# Patient Record
Sex: Female | Born: 1945
Health system: Southern US, Community
[De-identification: ages and names within clinical notes are randomized; demographics above are authoritative.]

## PROBLEM LIST (undated history)

## (undated) DIAGNOSIS — N200 Calculus of kidney: Secondary | ICD-10-CM

## (undated) DIAGNOSIS — I10 Essential (primary) hypertension: Secondary | ICD-10-CM

## (undated) DIAGNOSIS — I4891 Unspecified atrial fibrillation: Secondary | ICD-10-CM

## (undated) DIAGNOSIS — E119 Type 2 diabetes mellitus without complications: Secondary | ICD-10-CM

## (undated) DIAGNOSIS — C50919 Malignant neoplasm of unspecified site of unspecified female breast: Secondary | ICD-10-CM

## (undated) HISTORY — DX: Type 2 diabetes mellitus without complications: E11.9

## (undated) HISTORY — DX: Essential (primary) hypertension: I10

## (undated) HISTORY — PX: TUBAL LIGATION: SHX77

## (undated) HISTORY — PX: ADENOIDECTOMY: SUR15

## (undated) HISTORY — PX: TONSILLECTOMY: SUR1361

## (undated) HISTORY — DX: Unspecified atrial fibrillation: I48.91

## (undated) HISTORY — DX: Calculus of kidney: N20.0

## (undated) HISTORY — DX: Malignant neoplasm of unspecified site of unspecified female breast: C50.919

---

## 2011-12-12 ENCOUNTER — Encounter: Payer: Self-pay | Admitting: Physician Assistant

## 2011-12-12 DIAGNOSIS — I4891 Unspecified atrial fibrillation: Secondary | ICD-10-CM

## 2011-12-26 ENCOUNTER — Encounter: Payer: Self-pay | Admitting: *Deleted

## 2011-12-29 ENCOUNTER — Encounter: Payer: Self-pay | Admitting: Physician Assistant

## 2011-12-31 ENCOUNTER — Encounter: Payer: Self-pay | Admitting: Cardiology

## 2012-01-01 ENCOUNTER — Encounter: Admitting: Physician Assistant

## 2012-01-12 ENCOUNTER — Other Ambulatory Visit: Payer: Self-pay | Admitting: *Deleted

## 2012-01-12 ENCOUNTER — Encounter: Payer: Self-pay | Admitting: Cardiology

## 2012-01-12 ENCOUNTER — Ambulatory Visit (INDEPENDENT_AMBULATORY_CARE_PROVIDER_SITE_OTHER): Admitting: Cardiology

## 2012-01-12 VITALS — BP 179/91 | HR 71 | Ht 62.0 in | Wt 151.0 lb

## 2012-01-12 DIAGNOSIS — I10 Essential (primary) hypertension: Secondary | ICD-10-CM

## 2012-01-12 DIAGNOSIS — R9431 Abnormal electrocardiogram [ECG] [EKG]: Secondary | ICD-10-CM

## 2012-01-12 DIAGNOSIS — I4891 Unspecified atrial fibrillation: Secondary | ICD-10-CM | POA: Insufficient documentation

## 2012-01-12 MED ORDER — RIVAROXABAN 20 MG PO TABS: 20.0000 mg | ORAL_TABLET | Freq: Every day | ORAL | Status: DC

## 2012-01-12 MED ORDER — LISINOPRIL-HYDROCHLOROTHIAZIDE 20-12.5 MG PO TABS: 2.0000 | ORAL_TABLET | Freq: Every day | ORAL | Status: DC

## 2012-01-12 NOTE — Assessment & Plan Note (Signed)
She did not have evidence of an anterior MI on her echo. However, she has significant cardiovascular risk factors. My plan is an exercise treadmill test for her blood pressure is better controlled.

## 2012-01-12 NOTE — Patient Instructions (Addendum)
Your physician recommends that you schedule a follow-up appointment in: 3 months with Dr. Percival Spanish.   Your physician has recommended you make the following change in your medication:  Start xarelto 20 mg daily with supper or your biggest meal. Don't start Xarelto until after you've gotten approval from Dr. Loetta Rough) Increase lisinopril/hct take 2 by mouth daily to equal 40/25 mg. You have been given a 90 day supply prescription for both medications and a month supply has been sent to your local pharmacy.  Your physician recommends that you return for lab work in: in one week around July 8th 2013 at [facility].

## 2012-01-12 NOTE — Assessment & Plan Note (Signed)
The patient apparently has paroxysmal atrial fibrillation and she is in sinus now. She does have his CHADS score of 2 and a CHADS VASC score of 3 which gives her a 3.2% per year risk of stroke. She has a large left atrium on echo. I have discussed this with her in the risk benefits of anticoagulation. I would suggest treatment with anticoagulation once this he is cleared by her urologist. She will start Xarelto.

## 2012-01-12 NOTE — Progress Notes (Signed)
HPI The patient is a pleasant 66 year old female. She has no prior cardiac history. Recently she was in the hospital for management of ureteral stones. She had a urinary tract infection. She had some bacteremia. She was noted to have atrial fibrillation. She did not notice this rhythm. She doesn't describe any palpitations, presyncope or syncope.  She was seen in consultation by Dr. Domenic Polite.  She was not started on anticoagulation apparently because of the need for lithotripsy. She did have an echocardiogram which demonstrated moderate concentric left ventricular hypertrophy with an EF of 50-55%. She had significant left atrial enlargement. She was apparently in fibrillation at discharge but currently is sinus as described below. He denies any chest pressure, neck or arm discomfort. He denies any shortness of breath, PND or orthopnea. She's active.  Allergies  Allergen Reactions  . Sulfa Antibiotics     Current Outpatient Prescriptions  Medication Sig Dispense Refill  . atorvastatin (LIPITOR) 40 MG tablet Take 40 mg by mouth daily.      Marland Kitchen diltiazem (CARDIZEM CD) 120 MG 24 hr capsule Take 120 mg by mouth daily.      Marland Kitchen exenatide (BYETTA) 10 MCG/0.04ML SOLN Inject 10 mcg into the skin 2 (two) times daily with a meal.      . fluticasone (FLONASE) 50 MCG/ACT nasal spray Place 2 sprays into the nose daily.      Marland Kitchen lisinopril-hydrochlorothiazide (PRINZIDE,ZESTORETIC) 20-12.5 MG per tablet Take 1 tablet by mouth daily.      Marland Kitchen loratadine (CLARITIN) 10 MG tablet Take 10 mg by mouth daily.      . metFORMIN (GLUCOPHAGE) 1000 MG tablet Take 1,000 mg by mouth 2 (two) times daily with a meal.      . metoprolol succinate (TOPROL-XL) 50 MG 24 hr tablet Take 25 mg by mouth daily. Take with or immediately following a meal.      . Multiple Vitamin (MULTIVITAMIN) tablet Take 1 tablet by mouth daily.        Past Medical History  Diagnosis Date  . Atrial fibrillation   . Kidney stone     resulting in stenting    . Type 2 diabetes mellitus   . Unspecified essential hypertension     Past Surgical History  Procedure Date  . Tubal ligation   . Tonsillectomy   . Adenoidectomy     ROS: As stated in the HPI and negative for all other systems.  PHYSICAL EXAM BP 179/91  Pulse 71  Ht 5\' 2"  (1.575 m)  Wt 151 lb (68.493 kg)  BMI 27.62 kg/m2 GENERAL:  Well appearing HEENT:  Pupils equal round and reactive, fundi not visualized, oral mucosa unremarkable NECK:  No jugular venous distention, waveform within normal limits, carotid upstroke brisk and symmetric, no bruits, no thyromegaly LYMPHATICS:  No cervical, inguinal adenopathy LUNGS:  Clear to auscultation bilaterally BACK:  No CVA tenderness CHEST:  Unremarkable HEART:  PMI not displaced or sustained,S1 and S2 within normal limits, no S3, no S4, no clicks, no rubs, no murmurs ABD:  Flat, positive bowel sounds normal in frequency in pitch, no bruits, no rebound, no guarding, no midline pulsatile mass, no hepatomegaly, no splenomegaly EXT:  2 plus pulses throughout, no edema, no cyanosis no clubbing SKIN:  No rashes no nodules NEURO:  Cranial nerves II through XII grossly intact, motor grossly intact throughout PSYCH:  Cognitively intact, oriented to person place and time   EKG:  Sinus rhythm, rate 74, premature atrial contractions, premature ventricular contractions, left axis  deviation, poor anterior R wave progression, possible old anteroseptal infarct, no acute ST-T wave changes. 01/12/2012  ASSESSMENT AND PLAN

## 2012-01-12 NOTE — Assessment & Plan Note (Signed)
Her blood pressure is not well controlled. I will increase her lisinopril HCT to 40/25. A basic metabolic profile in one week.

## 2012-01-27 ENCOUNTER — Telehealth: Payer: Self-pay | Admitting: *Deleted

## 2012-01-27 NOTE — Telephone Encounter (Signed)
Patient informed via message machine. 

## 2012-01-27 NOTE — Telephone Encounter (Signed)
Message copied by Merlene Laughter on Tue Jan 27, 2012 10:46 AM ------      Message from: Minus Breeding      Created: Mon Jan 26, 2012 11:44 AM       Labs OK.

## 2012-02-05 ENCOUNTER — Other Ambulatory Visit (HOSPITAL_COMMUNITY): Payer: Self-pay | Admitting: Urology

## 2012-02-05 ENCOUNTER — Ambulatory Visit (HOSPITAL_COMMUNITY)
Admission: RE | Admit: 2012-02-05 | Discharge: 2012-02-05 | Disposition: A | Discharge status: Home / Self Care | Source: Ambulatory Visit | Attending: Urology | Admitting: Urology

## 2012-02-05 DIAGNOSIS — N201 Calculus of ureter: Secondary | ICD-10-CM

## 2012-04-05 ENCOUNTER — Encounter: Payer: Self-pay | Admitting: Cardiology

## 2012-04-05 ENCOUNTER — Encounter: Payer: Self-pay | Admitting: *Deleted

## 2012-04-05 ENCOUNTER — Other Ambulatory Visit: Payer: Self-pay | Admitting: *Deleted

## 2012-04-05 ENCOUNTER — Ambulatory Visit (INDEPENDENT_AMBULATORY_CARE_PROVIDER_SITE_OTHER): Admitting: Cardiology

## 2012-04-05 VITALS — BP 219/110 | HR 68 | Ht 62.75 in | Wt 164.0 lb

## 2012-04-05 DIAGNOSIS — I4891 Unspecified atrial fibrillation: Secondary | ICD-10-CM

## 2012-04-05 DIAGNOSIS — I10 Essential (primary) hypertension: Secondary | ICD-10-CM

## 2012-04-05 MED ORDER — SPIRONOLACTONE 25 MG PO TABS: 25.0000 mg | ORAL_TABLET | Freq: Every day | ORAL | Status: DC

## 2012-04-05 NOTE — Patient Instructions (Addendum)
Your physician recommends that you schedule a follow-up appointment in: 1 month. Your physician has recommended you make the following change in your medication: Start spironolactone 25 mg daily. All other medications will remain the same. Your new prescription has been sent to your pharmacy. Your physician recommends that you return for lab work in one week on September 30th, 2013 at [facility]. Your physician has requested that you regularly monitor and record your blood pressure readings at home. Please use the same machine at the same time of day to check your readings and record them to bring to your follow-up visit. Please come back to our office this afternoon to have your BP monitor checked.

## 2012-04-05 NOTE — Progress Notes (Signed)
HPI The patient presents for followup of atrial fibrillation and hypertension. Since last saw her she did start [place].   She has had no problems with this. She has had no symptomatic tachypalpitations. She denies any chest pressure, neck or arm discomfort. She denies any shortness of breath, PND or orthopnea. Her blood pressure is up today because she just drove from [place] and she did not take any of her medications as they might make her tired. She says her blood pressures have been in the 130s to [id number] at home systolic.  However, she's not sure if her blood pressure cuff is accurate.  Allergies  Allergen Reactions  . Sulfa Antibiotics     Current Outpatient Prescriptions  Medication Sig Dispense Refill  . atorvastatin (LIPITOR) 40 MG tablet Take 40 mg by mouth daily.      Marland Kitchen diltiazem (CARDIZEM CD) 120 MG 24 hr capsule Take 120 mg by mouth daily.      Marland Kitchen exenatide (BYETTA) 10 MCG/0.04ML SOLN Inject 10 mcg into the skin 2 (two) times daily with a meal.      . fluticasone (FLONASE) 50 MCG/ACT nasal spray Place 2 sprays into the nose daily.      Marland Kitchen lisinopril-hydrochlorothiazide (PRINZIDE,ZESTORETIC) 20-12.5 MG per tablet Take 2 tablets by mouth daily.  180 tablet  3  . loratadine (CLARITIN) 10 MG tablet Take 10 mg by mouth daily.      . metFORMIN (GLUCOPHAGE) 1000 MG tablet Take 1,000 mg by mouth 2 (two) times daily with a meal.      . metoprolol succinate (TOPROL-XL) 50 MG 24 hr tablet Take 25 mg by mouth daily. Take with or immediately following a meal.      . Multiple Vitamin (MULTIVITAMIN) tablet Take 1 tablet by mouth daily.      . Rivaroxaban (XARELTO) 20 MG TABS Take 1 tablet (20 mg total) by mouth daily with supper.  90 tablet  3    Past Medical History  Diagnosis Date  . Atrial fibrillation   . Kidney stone     resulting in stenting  . Type 2 diabetes mellitus   . Unspecified essential hypertension     Past Surgical History  Procedure Date  . Tubal ligation   .  Tonsillectomy   . Adenoidectomy     ROS: As stated in the HPI and negative for all other systems.  PHYSICAL EXAM BP 219/110  Pulse 68  Ht 5' 2.75" (1.594 m)  Wt 164 lb (74.39 kg)  BMI 29.28 kg/m2 GENERAL:  Well appearing HEENT:  Pupils equal round and reactive, fundi not visualized, oral mucosa unremarkable NECK:  No jugular venous distention, waveform within normal limits, carotid upstroke brisk and symmetric, no bruits, no thyromegaly LYMPHATICS:  No cervical, inguinal adenopathy LUNGS:  Clear to auscultation bilaterally BACK:  No CVA tenderness CHEST:  Unremarkable HEART:  PMI not displaced or sustained,S1 and S2 within normal limits, no S3, no S4, no clicks, no rubs, no murmurs ABD:  Flat, positive bowel sounds normal in frequency in pitch, no bruits, no rebound, no guarding, no midline pulsatile mass, no hepatomegaly, no splenomegaly EXT:  2 plus pulses throughout, no edema, no cyanosis no clubbing SKIN:  No rashes no nodules NEURO:  Cranial nerves II through XII grossly intact, motor grossly intact throughout PSYCH:  Cognitively intact, oriented to person place and time  EKG:   Sinus rhythm, rate 68, leftward axis, intervals within normal limits, poor anterior R wave progression suggestive of  an old anteroseptal infarct, no acute ST-T wave changes , no change from previous.  04/05/2012  ASSESSMENT AND PLAN  Atrial fibrillation -  The patient  tolerates this rhythm and rate control and anticoagulation. We will continue with the meds as listed.  HTN (hypertension) - She tolerated her increased dose of lisinopril HCT in the last appointment. However, I still don't think she's well controlled. I'm going to add spironolactone. She should come back later today to have her blood pressure cuff the elevated and a repeat blood pressure after she has rested and taken her medications.  Abnormal EKG -  She did not have evidence of an anterior MI on her echo previously. However, she has  significant cardiovascular risk factors. My plan is an exercise treadmill test for her blood pressure is better controlled.

## 2012-04-12 ENCOUNTER — Encounter: Payer: Self-pay | Admitting: Cardiology

## 2012-04-20 ENCOUNTER — Telehealth: Payer: Self-pay | Admitting: *Deleted

## 2012-04-20 NOTE — Telephone Encounter (Signed)
Patient informed and copy sent to PCP. 

## 2012-04-20 NOTE — Telephone Encounter (Signed)
Message copied by Merlene Laughter on Tue Apr 20, 2012  2:12 PM ------      Message from: Minus Breeding      Created: Sun Apr 18, 2012 12:53 PM       Labs OK.  Call Ms. Pollard with the results and send results to Manon Hilding, MD

## 2012-04-28 ENCOUNTER — Ambulatory Visit: Admitting: Cardiology

## 2012-10-27 DIAGNOSIS — E119 Type 2 diabetes mellitus without complications: Secondary | ICD-10-CM | POA: Diagnosis not present

## 2012-10-27 DIAGNOSIS — E7800 Pure hypercholesterolemia, unspecified: Secondary | ICD-10-CM | POA: Diagnosis not present

## 2012-10-27 DIAGNOSIS — I10 Essential (primary) hypertension: Secondary | ICD-10-CM | POA: Diagnosis not present

## 2012-11-03 DIAGNOSIS — I10 Essential (primary) hypertension: Secondary | ICD-10-CM | POA: Diagnosis not present

## 2012-11-03 DIAGNOSIS — N189 Chronic kidney disease, unspecified: Secondary | ICD-10-CM | POA: Diagnosis not present

## 2012-11-03 DIAGNOSIS — J309 Allergic rhinitis, unspecified: Secondary | ICD-10-CM | POA: Diagnosis not present

## 2012-11-03 DIAGNOSIS — E119 Type 2 diabetes mellitus without complications: Secondary | ICD-10-CM | POA: Diagnosis not present

## 2012-11-03 DIAGNOSIS — E7800 Pure hypercholesterolemia, unspecified: Secondary | ICD-10-CM | POA: Diagnosis not present

## 2012-11-07 DIAGNOSIS — N1330 Unspecified hydronephrosis: Secondary | ICD-10-CM | POA: Diagnosis not present

## 2012-11-07 DIAGNOSIS — D279 Benign neoplasm of unspecified ovary: Secondary | ICD-10-CM | POA: Diagnosis not present

## 2012-11-07 DIAGNOSIS — Z87442 Personal history of urinary calculi: Secondary | ICD-10-CM | POA: Diagnosis not present

## 2012-11-07 DIAGNOSIS — N200 Calculus of kidney: Secondary | ICD-10-CM | POA: Diagnosis not present

## 2012-11-07 DIAGNOSIS — I4891 Unspecified atrial fibrillation: Secondary | ICD-10-CM | POA: Diagnosis not present

## 2012-11-07 DIAGNOSIS — K7689 Other specified diseases of liver: Secondary | ICD-10-CM | POA: Diagnosis not present

## 2012-11-07 DIAGNOSIS — E119 Type 2 diabetes mellitus without complications: Secondary | ICD-10-CM | POA: Diagnosis not present

## 2012-11-07 DIAGNOSIS — I10 Essential (primary) hypertension: Secondary | ICD-10-CM | POA: Diagnosis not present

## 2012-11-07 DIAGNOSIS — N390 Urinary tract infection, site not specified: Secondary | ICD-10-CM | POA: Diagnosis not present

## 2012-11-07 DIAGNOSIS — Z79899 Other long term (current) drug therapy: Secondary | ICD-10-CM | POA: Diagnosis not present

## 2012-11-10 DIAGNOSIS — N950 Postmenopausal bleeding: Secondary | ICD-10-CM | POA: Diagnosis not present

## 2012-11-10 DIAGNOSIS — Z124 Encounter for screening for malignant neoplasm of cervix: Secondary | ICD-10-CM | POA: Diagnosis not present

## 2012-11-10 DIAGNOSIS — Z01419 Encounter for gynecological examination (general) (routine) without abnormal findings: Secondary | ICD-10-CM | POA: Diagnosis not present

## 2012-11-12 DIAGNOSIS — Z1231 Encounter for screening mammogram for malignant neoplasm of breast: Secondary | ICD-10-CM | POA: Diagnosis not present

## 2012-11-19 DIAGNOSIS — D259 Leiomyoma of uterus, unspecified: Secondary | ICD-10-CM | POA: Diagnosis not present

## 2012-11-19 DIAGNOSIS — N83209 Unspecified ovarian cyst, unspecified side: Secondary | ICD-10-CM | POA: Diagnosis not present

## 2012-11-19 DIAGNOSIS — N950 Postmenopausal bleeding: Secondary | ICD-10-CM | POA: Diagnosis not present

## 2012-12-08 DIAGNOSIS — R21 Rash and other nonspecific skin eruption: Secondary | ICD-10-CM | POA: Diagnosis not present

## 2012-12-21 DIAGNOSIS — N950 Postmenopausal bleeding: Secondary | ICD-10-CM | POA: Diagnosis not present

## 2012-12-21 DIAGNOSIS — I10 Essential (primary) hypertension: Secondary | ICD-10-CM | POA: Diagnosis not present

## 2012-12-21 DIAGNOSIS — N859 Noninflammatory disorder of uterus, unspecified: Secondary | ICD-10-CM | POA: Diagnosis not present

## 2013-01-10 DIAGNOSIS — Z780 Asymptomatic menopausal state: Secondary | ICD-10-CM | POA: Diagnosis not present

## 2013-01-10 DIAGNOSIS — M949 Disorder of cartilage, unspecified: Secondary | ICD-10-CM | POA: Diagnosis not present

## 2013-01-10 DIAGNOSIS — M899 Disorder of bone, unspecified: Secondary | ICD-10-CM | POA: Diagnosis not present

## 2013-01-28 DIAGNOSIS — E119 Type 2 diabetes mellitus without complications: Secondary | ICD-10-CM | POA: Diagnosis not present

## 2013-01-28 DIAGNOSIS — E7800 Pure hypercholesterolemia, unspecified: Secondary | ICD-10-CM | POA: Diagnosis not present

## 2013-01-28 DIAGNOSIS — I10 Essential (primary) hypertension: Secondary | ICD-10-CM | POA: Diagnosis not present

## 2013-02-08 DIAGNOSIS — E119 Type 2 diabetes mellitus without complications: Secondary | ICD-10-CM | POA: Diagnosis not present

## 2013-02-08 DIAGNOSIS — N189 Chronic kidney disease, unspecified: Secondary | ICD-10-CM | POA: Diagnosis not present

## 2013-02-08 DIAGNOSIS — I10 Essential (primary) hypertension: Secondary | ICD-10-CM | POA: Diagnosis not present

## 2013-02-08 DIAGNOSIS — E7800 Pure hypercholesterolemia, unspecified: Secondary | ICD-10-CM | POA: Diagnosis not present

## 2013-02-08 DIAGNOSIS — J309 Allergic rhinitis, unspecified: Secondary | ICD-10-CM | POA: Diagnosis not present

## 2013-05-09 DIAGNOSIS — E119 Type 2 diabetes mellitus without complications: Secondary | ICD-10-CM | POA: Diagnosis not present

## 2013-05-10 DIAGNOSIS — E7800 Pure hypercholesterolemia, unspecified: Secondary | ICD-10-CM | POA: Diagnosis not present

## 2013-05-10 DIAGNOSIS — E119 Type 2 diabetes mellitus without complications: Secondary | ICD-10-CM | POA: Diagnosis not present

## 2013-05-10 DIAGNOSIS — N189 Chronic kidney disease, unspecified: Secondary | ICD-10-CM | POA: Diagnosis not present

## 2013-05-10 DIAGNOSIS — J309 Allergic rhinitis, unspecified: Secondary | ICD-10-CM | POA: Diagnosis not present

## 2013-05-10 DIAGNOSIS — I10 Essential (primary) hypertension: Secondary | ICD-10-CM | POA: Diagnosis not present

## 2013-08-17 DIAGNOSIS — E7800 Pure hypercholesterolemia, unspecified: Secondary | ICD-10-CM | POA: Diagnosis not present

## 2013-08-17 DIAGNOSIS — I10 Essential (primary) hypertension: Secondary | ICD-10-CM | POA: Diagnosis not present

## 2013-08-17 DIAGNOSIS — E119 Type 2 diabetes mellitus without complications: Secondary | ICD-10-CM | POA: Diagnosis not present

## 2013-08-17 DIAGNOSIS — N189 Chronic kidney disease, unspecified: Secondary | ICD-10-CM | POA: Diagnosis not present

## 2013-08-19 DIAGNOSIS — E119 Type 2 diabetes mellitus without complications: Secondary | ICD-10-CM | POA: Diagnosis not present

## 2013-08-19 DIAGNOSIS — N189 Chronic kidney disease, unspecified: Secondary | ICD-10-CM | POA: Diagnosis not present

## 2013-08-19 DIAGNOSIS — J309 Allergic rhinitis, unspecified: Secondary | ICD-10-CM | POA: Diagnosis not present

## 2013-08-19 DIAGNOSIS — I10 Essential (primary) hypertension: Secondary | ICD-10-CM | POA: Diagnosis not present

## 2013-08-19 DIAGNOSIS — I499 Cardiac arrhythmia, unspecified: Secondary | ICD-10-CM | POA: Diagnosis not present

## 2013-08-19 DIAGNOSIS — E7800 Pure hypercholesterolemia, unspecified: Secondary | ICD-10-CM | POA: Diagnosis not present

## 2013-08-24 ENCOUNTER — Ambulatory Visit (INDEPENDENT_AMBULATORY_CARE_PROVIDER_SITE_OTHER): Payer: Medicare Other | Admitting: Cardiovascular Disease

## 2013-08-24 VITALS — BP 218/119

## 2013-08-24 DIAGNOSIS — R9431 Abnormal electrocardiogram [ECG] [EKG]: Secondary | ICD-10-CM

## 2013-08-24 DIAGNOSIS — I10 Essential (primary) hypertension: Secondary | ICD-10-CM | POA: Diagnosis not present

## 2013-08-24 DIAGNOSIS — I4891 Unspecified atrial fibrillation: Secondary | ICD-10-CM | POA: Diagnosis not present

## 2013-08-24 DIAGNOSIS — E785 Hyperlipidemia, unspecified: Secondary | ICD-10-CM

## 2013-08-24 MED ORDER — APIXABAN 5 MG PO TABS: 5.0000 mg | ORAL_TABLET | Freq: Two times a day (BID) | ORAL | Status: DC

## 2013-08-24 NOTE — Progress Notes (Signed)
Patient ID: Betty Goodman, female   DOB: [birth year], 68 y.o.   MRN: [id number]      SUBJECTIVE: The patient is a 68 year old woman with a history of atrial fibrillation, hypertension, diabetes mellitus, and hyperlipidemia. An ECG performed on 08/19/2013 showed normal sinus rhythm with atrial bigeminy. She has not been taking her Xarelto as it led to vaginal bleeding, and she has a h/o anemia. She also did not take her antihypertensive medications this morning as she was in a rush as her husband needs back surgery in [place]. Review of recent labs from February 4 showed HbA1c 7.5%, total cholesterol 133, triglycerides 110, HDL 50, LDL 61, BUN 18, creatinine 0.87. An ECG performed in the office today shows sinus rhythm with PACs and PVCs as well as a left anterior fascicular block with a QRS axis of -50. The patient denies any symptoms of chest pain, palpitations, shortness of breath, lightheadedness, dizziness, leg swelling, orthopnea, PND, and syncope.     Allergies  Allergen Reactions  . Sulfa Antibiotics     Current Outpatient Prescriptions  Medication Sig Dispense Refill  . atorvastatin (LIPITOR) 40 MG tablet Take 40 mg by mouth daily.      Marland Kitchen diltiazem (CARDIZEM CD) 120 MG 24 hr capsule Take 120 mg by mouth daily.      Marland Kitchen exenatide (BYETTA) 10 MCG/0.04ML SOLN Inject 10 mcg into the skin 2 (two) times daily with a meal.      . fluticasone (FLONASE) 50 MCG/ACT nasal spray Place 2 sprays into the nose daily.      Marland Kitchen lisinopril-hydrochlorothiazide (PRINZIDE,ZESTORETIC) 20-12.5 MG per tablet Take 2 tablets by mouth daily.  180 tablet  3  . loratadine (CLARITIN) 10 MG tablet Take 10 mg by mouth daily.      . metFORMIN (GLUCOPHAGE) 1000 MG tablet Take 1,000 mg by mouth 2 (two) times daily with a meal.      . metoprolol succinate (TOPROL-XL) 50 MG 24 hr tablet Take 25 mg by mouth daily. Take with or immediately following a meal.      . Multiple Vitamin (MULTIVITAMIN) tablet Take 1 tablet by  mouth daily.      . Rivaroxaban (XARELTO) 20 MG TABS Take 1 tablet (20 mg total) by mouth daily with supper.  90 tablet  3  . spironolactone (ALDACTONE) 25 MG tablet Take 1 tablet (25 mg total) by mouth daily.  30 tablet  1   No current facility-administered medications for this visit.    Past Medical History  Diagnosis Date  . Atrial fibrillation   . Kidney stone     resulting in stenting  . Type 2 diabetes mellitus   . Unspecified essential hypertension     Past Surgical History  Procedure Laterality Date  . Tubal ligation    . Tonsillectomy    . Adenoidectomy      History   Social History  . Marital Status: Married    Spouse Name: N/A    Number of Children: 4  . Years of Education: N/A   Occupational History  . Retired    Social History Main Topics  . Smoking status: Never Smoker   . Smokeless tobacco: Never Used  . Alcohol Use: No  . Drug Use: Not on file  . Sexual Activity: Not on file   Other Topics Concern  . Not on file   Social History Narrative   Lives with husband one daughter and two grandchildren.    BP: 218/119 mmHg HR: 85  bpm  PHYSICAL EXAM General: NAD Neck: No JVD, no thyromegaly or thyroid nodule.  Lungs: Clear to auscultation bilaterally with normal respiratory effort. CV: Nondisplaced PMI.  Heart irregular rhythm, normal S1/S2, no S3/S4, no murmur.  No peripheral edema.  No carotid bruit.  Normal pedal pulses.  Abdomen: Soft, nontender, no hepatosplenomegaly, no distention.  Neurologic: Alert and oriented x 3.  Psych: Normal affect. Extremities: No clubbing or cyanosis.   ECG: reviewed and available in electronic records.      ASSESSMENT AND PLAN: 1. Atrial fibrillation: She is currently in sinus rhythm with PACs and PVCs. She is asymptomatic. Given her problems with vaginal bleeding with Xarelto, I will initiate her on Eliquis 5 mg twice daily. I will then check a CBC in 2 weeks to assess her hemoglobin. 2. Hypertension:  Markedly elevated today, as she has not taken her antihypertensives. I strongly encouraged her to take her medications regularly given the risk for stroke and myocardial infarction. She has agreed to do so. I will have her return in 2 weeks to have her blood pressure checked with one of our nurses. 3. Hyperlipidemia: well controlled on Lipitor 40 mg daily.  Dispo: f/u 6 months.  Kate Sable, M.D., F.A.C.C.

## 2013-08-24 NOTE — Patient Instructions (Addendum)
Your physician recommends that you schedule a follow-up appointment in: 6 months. You will receive a reminder letter in the mail in about 4 months reminding you to call and schedule your appointment. If you don't receive this letter, please contact our office. Your physician has recommended you make the following change in your medication: START ELIQUIS 5 MG BY MOUTH TWICE DAILY.  Your new prescription has been sent to your pharmacy. All other medications will remain the same. Your physician recommends that you have lab work in 2 weeks of starting eliquis to check your CBC.  Please come for a nurse visit in 2 weeks to check your blood pressure. Patient instructed to call office back with contact information for [facility] in [place] for 90 day supply eliquis prescription.

## 2013-09-13 ENCOUNTER — Encounter: Payer: Self-pay | Admitting: Cardiology

## 2013-09-13 ENCOUNTER — Ambulatory Visit (INDEPENDENT_AMBULATORY_CARE_PROVIDER_SITE_OTHER): Payer: Medicare Other | Admitting: *Deleted

## 2013-09-13 VITALS — BP 198/106 | HR 67 | Ht 62.75 in | Wt 169.0 lb

## 2013-09-13 DIAGNOSIS — I10 Essential (primary) hypertension: Secondary | ICD-10-CM | POA: Diagnosis not present

## 2013-09-13 NOTE — Progress Notes (Signed)
Please increase clonidine to 0.2 mg 3 times daily and arrange for followup visit in the office within a week.

## 2013-09-13 NOTE — Progress Notes (Signed)
Patient informed and instructed to take (2) of her clonidine tablets 3x's daily and hold her eliquis. Patient said she had enough tablets of the clonidine so new prescription was not sent to the pharmacy. Patient given appointment with Jory Sims on Monday at 1:30 pm at the [facility] office. Patient verbalized understanding of plan.

## 2013-09-13 NOTE — Progress Notes (Signed)
The patient had been seen in the office recently by our team. Blood pressure was markedly elevated. She was brought back for followup blood pressure check today. Her diastolic pressure remains above 100. She has been on Eliquis for atrial fibrillation. I'm uncomfortable using this medication now with her continued elevated blood pressure. Her anticoagulant will be put on hold until better more stable blood pressure is obtained.  Daryel November, MD

## 2013-09-13 NOTE — Progress Notes (Signed)
Patient was recently in the office. Blood pressure was markedly elevated. Medicines were adjusted. She was brought back today for a nurse visit. Information was shown to me. She continues to have significant hypertension. She is anticoagulated because of her atrial fib. I am uncomfortable with the continuation of her anticoagulation until her blood pressures under better control. Eliquis will be put on hold until we document that her blood pressures under better control. She will be brought back to the office for an early followup visit after her clonidine dose was increased today.  Daryel November, MD

## 2013-09-13 NOTE — Progress Notes (Signed)
Patient presents to office today for nurse BP check which was requested at last office visit due to elevated BP. Patient has taken all of her medications without missing any doses. Patient denies sob, dizziness or chest pain. Patient reports to nurse today that she does have a dry mouth sensation that she feels is coming from her blood pressure medications. Patient also informed nurse that she is under a great deal of stress dealing with her husband. Patient reported to nurse today that she cooks with cream of chicken soup a lot. Patient sat in office today for total of 28 minutes allowing time for BP to come to its lowest point after sitting. Nurse advised patient to avoid high sodium foods especially can soups. Nurse also gave patient a home BP monitor and requested patient to monitor her BP at home.

## 2013-09-19 ENCOUNTER — Ambulatory Visit (INDEPENDENT_AMBULATORY_CARE_PROVIDER_SITE_OTHER): Payer: Medicare Other | Admitting: Adult Health

## 2013-09-19 ENCOUNTER — Encounter: Payer: Self-pay | Admitting: Adult Health

## 2013-09-19 VITALS — BP 178/98 | HR 44 | Ht 62.0 in | Wt 170.0 lb

## 2013-09-19 DIAGNOSIS — I4891 Unspecified atrial fibrillation: Secondary | ICD-10-CM

## 2013-09-19 DIAGNOSIS — I10 Essential (primary) hypertension: Secondary | ICD-10-CM

## 2013-09-19 MED ORDER — LISINOPRIL-HYDROCHLOROTHIAZIDE 20-12.5 MG PO TABS: 1.0000 | ORAL_TABLET | Freq: Two times a day (BID) | ORAL | Status: DC

## 2013-09-19 NOTE — Assessment & Plan Note (Signed)
Currently in NSR with PAC's. She is not on Eliquis at this time due to hypertension. I will hold off on restarting this until BP is better controlled to avoid intracranial bleeding. She is to take her BP at home and record. I will review her labs and make recommendations to restart Eliquis when we evaluate her BP again on increased dose of lisinopril. BMET ordered.

## 2013-09-19 NOTE — Patient Instructions (Signed)
Your physician recommends that you schedule a follow-up appointment in: 1 month with Dr Bronson Ing  Your physician has recommended you make the following change in your medication:  Take Lisinopril/HCTZ take 1 tablet twice a day Stop Diltiazem  Your physician recommends that you return for lab work today. BMET, CBC

## 2013-09-19 NOTE — Progress Notes (Deleted)
Name: Betty Goodman    DOB: 08-02-1945  Age: 68 y.o.  MR#: [id number]       PCP:  Manon Hilding, MD      Insurance: Payor: MEDICARE / Plan: MEDICARE PART A AND B / Product Type: *No Product type* /   CC:    Chief Complaint  Patient presents with  . Atrial Fibrillation  . Hypertension    VS Filed Vitals:   09/19/13 1312  BP: 178/98  Pulse: 44  Height: 5\' 2"  (1.575 m)  Weight: 170 lb (77.111 kg)  SpO2: 99%    Weights Current Weight  09/19/13 170 lb (77.111 kg)  09/13/13 169 lb (76.658 kg)  04/05/12 164 lb (74.39 kg)    Blood Pressure  BP Readings from Last 3 Encounters:  09/19/13 178/98  09/13/13 198/106  08/24/13 218/119     Admit date:  (Not on file) Last encounter with RMR:  Visit date not found   Allergy Sulfa antibiotics  Current Outpatient Prescriptions  Medication Sig Dispense Refill  . alendronate (FOSAMAX) 70 MG tablet Take 70 mg by mouth once a week. Take with a full glass of water on an empty stomach.      Marland Kitchen apixaban (ELIQUIS) 5 MG TABS tablet Take 1 tablet (5 mg total) by mouth 2 (two) times daily.  60 tablet  0  . atorvastatin (LIPITOR) 40 MG tablet Take 40 mg by mouth daily.      . cloNIDine (CATAPRES) 0.1 MG tablet Take 0.2 mg by mouth 3 (three) times daily.       Marland Kitchen diltiazem (CARDIZEM CD) 120 MG 24 hr capsule Take 120 mg by mouth daily.      . fluticasone (FLONASE) 50 MCG/ACT nasal spray Place 2 sprays into the nose daily.      Marland Kitchen glipiZIDE (GLUCOTROL) 5 MG tablet Take 5 mg by mouth daily before breakfast.      . Liraglutide (VICTOZA [place]) Inject 1.2 mg into the skin daily.      Marland Kitchen lisinopril-hydrochlorothiazide (PRINZIDE,ZESTORETIC) 20-12.5 MG per tablet Take 2 tablets by mouth daily.  180 tablet  3  . loratadine (CLARITIN) 10 MG tablet Take 10 mg by mouth daily.      . metFORMIN (GLUCOPHAGE) 1000 MG tablet Take 1,000 mg by mouth 2 (two) times daily with a meal.      . metoprolol succinate (TOPROL-XL) 50 MG 24 hr tablet Take 25 mg by mouth daily. Take  with or immediately following a meal.       No current facility-administered medications for this visit.    Discontinued Meds:   There are no discontinued medications.  Patient Active Problem List   Diagnosis Date Noted  . Atrial fibrillation 01/12/2012  . HTN (hypertension) 01/12/2012  . Abnormal EKG 01/12/2012    LABS No results found for this basename: na, k, cl, co2, glucose, bun, creatinine, calcium, gfrnonaa, gfraa   CMP  No results found for this basename: na, k, cl, co2, glucose, bun, creatinine, calcium, prot, albumin, ast, alt, alkphos, bilitot, gfrnonaa, gfraa    No results found for this basename: wbc, hgb, hct, mcv, platelets    Lipid Panel  No results found for this basename: chol, trig, hdl, cholhdl, vldl, ldlcalc    ABG No results found for this basename: phart, pco2, pco2art, po2, po2art, hco3, tco2, acidbasedef, o2sat     No results found for this basename: TSH   BNP (last 3 results) No results found for this basename: PROBNP,  in  the last 8760 hours Cardiac Panel (last 3 results) No results found for this basename: CKTOTAL, CKMB, TROPONINI, RELINDX,  in the last 72 hours  Iron/TIBC/Ferritin No results found for this basename: iron, tibc, ferritin     EKG Orders placed in visit on 08/24/13  . EKG 12-LEAD     Prior Assessment and Plan Problem List as of 09/19/2013     Cardiovascular and Mediastinum   Atrial fibrillation   Last Assessment & Plan   01/12/2012 Office Visit Written 01/12/2012 12:44 PM by Minus Breeding, MD     The patient apparently has paroxysmal atrial fibrillation and she is in sinus now. She does have his CHADS score of 2 and a CHADS VASC score of 3 which gives her a 3.2% per year risk of stroke. She has a large left atrium on echo. I have discussed this with her in the risk benefits of anticoagulation. I would suggest treatment with anticoagulation once this he is cleared by her urologist. She will start Xarelto.      HTN  (hypertension)   Last Assessment & Plan   01/12/2012 Office Visit Written 01/12/2012 12:44 PM by Minus Breeding, MD     Her blood pressure is not well controlled. I will increase her lisinopril HCT to 40/25. A basic metabolic profile in one week.      Other   Abnormal EKG   Last Assessment & Plan   01/12/2012 Office Visit Written 01/12/2012 12:45 PM by Minus Breeding, MD     She did not have evidence of an anterior MI on her echo. However, she has significant cardiovascular risk factors. My plan is an exercise treadmill test for her blood pressure is better controlled.        Imaging: No results found.

## 2013-09-19 NOTE — Assessment & Plan Note (Signed)
Blood pressure is moderately controlled. She has taken herself off of diltiazem as this is making her sleepy,   I will increase lisinopril to 20 mg/ 12. 5 mg BID as she was supposed to be taking. Follow up BMET and CBC are ordered. She will see [place] again in 1 month.

## 2013-09-19 NOTE — Progress Notes (Signed)
HPI: Betty Goodman is a 68 year old patient of Dr. Pennelope Bracken that we follow for ongoing assessment and management of atrial fibrillation, hypertension, diabetes, hyperlipidemia. Most recent EKG in February 2015 demonstrated normal sinus rhythm with atrial bigeminy. She is not on Xarelto as it lead to vaginal bleeding and she also has a history of anemia. She was last seen by Dr. Pennelope Bracken on 08/24/2013 and was found to be hypertensive, but did not take her medication that day. She was asymptomatic. She was to followup again in 6 months.    She comes today with confusion over her medications. On last visit, Eliquis was stopped due to hypertension She was increased on clonidene to TID. Follow up labs were to be completed, but she did not have these done. She is also not taking Cardizem as this is making her sleepy.  She has no complaints of chest pain or dizziness. She admits to eating a lot of salty foods and being under a lot of stress her husband who is ill.     Allergies  Allergen Reactions  . Sulfa Antibiotics     Current Outpatient Prescriptions  Medication Sig Dispense Refill  . alendronate (FOSAMAX) 70 MG tablet Take 70 mg by mouth once a week. Take with a full glass of water on an empty stomach.      Marland Kitchen apixaban (ELIQUIS) 5 MG TABS tablet Take 1 tablet (5 mg total) by mouth 2 (two) times daily.  60 tablet  0  . atorvastatin (LIPITOR) 40 MG tablet Take 40 mg by mouth daily.      . cloNIDine (CATAPRES) 0.1 MG tablet Take 0.2 mg by mouth 3 (three) times daily.       Marland Kitchen diltiazem (CARDIZEM CD) 120 MG 24 hr capsule Take 120 mg by mouth daily.      . fluticasone (FLONASE) 50 MCG/ACT nasal spray Place 2 sprays into the nose daily.      Marland Kitchen glipiZIDE (GLUCOTROL) 5 MG tablet Take 5 mg by mouth daily before breakfast.      . Liraglutide (VICTOZA [place]) Inject 1.2 mg into the skin daily.      Marland Kitchen lisinopril-hydrochlorothiazide (PRINZIDE,ZESTORETIC) 20-12.5 MG per tablet Take 2 tablets by mouth daily.  180  tablet  3  . loratadine (CLARITIN) 10 MG tablet Take 10 mg by mouth daily.      . metFORMIN (GLUCOPHAGE) 1000 MG tablet Take 1,000 mg by mouth 2 (two) times daily with a meal.      . metoprolol succinate (TOPROL-XL) 50 MG 24 hr tablet Take 25 mg by mouth daily. Take with or immediately following a meal.       No current facility-administered medications for this visit.    Past Medical History  Diagnosis Date  . Atrial fibrillation   . Kidney stone     resulting in stenting  . Type 2 diabetes mellitus   . Unspecified essential hypertension     Past Surgical History  Procedure Laterality Date  . Tubal ligation    . Tonsillectomy    . Adenoidectomy      [id number] of systems complete and found to be negative unless listed above  PHYSICAL EXAM BP 178/98  Pulse 44  Ht 5\' 2"  (1.575 m)  Wt 170 lb (77.111 kg)  BMI 31.09 kg/m2  SpO2 99%  General: Well developed, well nourished, in no acute distress Head: Eyes PERRLA, No xanthomas.   Normal cephalic and atramatic  Lungs: Clear bilaterally to auscultation and percussion. Heart: HRRR  S1 S2, bradycardic without MRG.  Pulses are 2+ & equal.            No carotid bruit. No JVD.  No abdominal bruits. No femoral bruits. Abdomen: Bowel sounds are positive, abdomen soft and non-tender without masses or                  Hernia's noted. Msk:  Back normal, normal gait. Normal strength and tone for age. Extremities: No clubbing, cyanosis or edema.  DP +1 Neuro: Alert and oriented X 3. Psych:  Good affect, responds appropriately  EKG: NSR with PAC's. 71 bpm.   ASSESSMENT AND PLAN

## 2013-09-20 ENCOUNTER — Encounter: Payer: Self-pay | Admitting: *Deleted

## 2013-09-20 LAB — BASIC METABOLIC PANEL
BUN: 23 mg/dL (ref 6–23)
CALCIUM: 9.9 mg/dL (ref 8.4–10.5)
CO2: 32 mEq/L (ref 19–32)
Chloride: 100 mEq/L (ref 96–112)
Creat: 0.84 mg/dL (ref 0.50–1.10)
Glucose, Bld: 160 mg/dL — ABNORMAL HIGH (ref 70–99)
Potassium: 3.9 mEq/L (ref 3.5–5.3)
SODIUM: 143 meq/L (ref 135–145)

## 2013-09-20 LAB — CBC
HCT: 41.3 % (ref 36.0–46.0)
Hemoglobin: 13.4 g/dL (ref 12.0–15.0)
MCH: 26.8 pg (ref 26.0–34.0)
MCHC: 32.4 g/dL (ref 30.0–36.0)
MCV: 82.6 fL (ref 78.0–100.0)
PLATELETS: 229 10*3/uL (ref 150–400)
RBC: 5 MIL/uL (ref 3.87–5.11)
RDW: 14.3 % (ref 11.5–15.5)
WBC: 8.4 10*3/uL (ref 4.0–10.5)

## 2013-09-22 ENCOUNTER — Ambulatory Visit: Payer: Medicare Other | Admitting: Adult Health

## 2013-10-25 ENCOUNTER — Ambulatory Visit: Payer: Medicare Other | Admitting: Cardiovascular Disease

## 2013-11-14 ENCOUNTER — Ambulatory Visit: Payer: Medicare Other | Admitting: Cardiovascular Disease

## 2013-11-17 DIAGNOSIS — I10 Essential (primary) hypertension: Secondary | ICD-10-CM | POA: Diagnosis not present

## 2013-11-17 DIAGNOSIS — E7800 Pure hypercholesterolemia, unspecified: Secondary | ICD-10-CM | POA: Diagnosis not present

## 2013-11-17 DIAGNOSIS — E119 Type 2 diabetes mellitus without complications: Secondary | ICD-10-CM | POA: Diagnosis not present

## 2013-11-22 DIAGNOSIS — E7800 Pure hypercholesterolemia, unspecified: Secondary | ICD-10-CM | POA: Diagnosis not present

## 2013-11-22 DIAGNOSIS — J309 Allergic rhinitis, unspecified: Secondary | ICD-10-CM | POA: Diagnosis not present

## 2013-11-22 DIAGNOSIS — I10 Essential (primary) hypertension: Secondary | ICD-10-CM | POA: Diagnosis not present

## 2013-11-22 DIAGNOSIS — R1032 Left lower quadrant pain: Secondary | ICD-10-CM | POA: Diagnosis not present

## 2013-11-22 DIAGNOSIS — N949 Unspecified condition associated with female genital organs and menstrual cycle: Secondary | ICD-10-CM | POA: Diagnosis not present

## 2013-11-22 DIAGNOSIS — E119 Type 2 diabetes mellitus without complications: Secondary | ICD-10-CM | POA: Diagnosis not present

## 2013-11-22 DIAGNOSIS — N189 Chronic kidney disease, unspecified: Secondary | ICD-10-CM | POA: Diagnosis not present

## 2013-11-22 DIAGNOSIS — I499 Cardiac arrhythmia, unspecified: Secondary | ICD-10-CM | POA: Diagnosis not present

## 2013-11-23 DIAGNOSIS — K7689 Other specified diseases of liver: Secondary | ICD-10-CM | POA: Diagnosis not present

## 2013-11-23 DIAGNOSIS — N1330 Unspecified hydronephrosis: Secondary | ICD-10-CM | POA: Diagnosis not present

## 2013-11-23 DIAGNOSIS — N949 Unspecified condition associated with female genital organs and menstrual cycle: Secondary | ICD-10-CM | POA: Diagnosis not present

## 2013-11-23 DIAGNOSIS — R109 Unspecified abdominal pain: Secondary | ICD-10-CM | POA: Diagnosis not present

## 2013-11-23 DIAGNOSIS — Z8742 Personal history of other diseases of the female genital tract: Secondary | ICD-10-CM | POA: Diagnosis not present

## 2013-11-23 DIAGNOSIS — R1032 Left lower quadrant pain: Secondary | ICD-10-CM | POA: Diagnosis not present

## 2013-11-23 DIAGNOSIS — N9489 Other specified conditions associated with female genital organs and menstrual cycle: Secondary | ICD-10-CM | POA: Diagnosis not present

## 2013-12-02 DIAGNOSIS — N3000 Acute cystitis without hematuria: Secondary | ICD-10-CM | POA: Diagnosis not present

## 2013-12-02 DIAGNOSIS — N200 Calculus of kidney: Secondary | ICD-10-CM | POA: Diagnosis not present

## 2014-02-14 DIAGNOSIS — E119 Type 2 diabetes mellitus without complications: Secondary | ICD-10-CM | POA: Diagnosis not present

## 2014-02-14 DIAGNOSIS — Z87442 Personal history of urinary calculi: Secondary | ICD-10-CM | POA: Diagnosis not present

## 2014-02-14 DIAGNOSIS — Z79899 Other long term (current) drug therapy: Secondary | ICD-10-CM | POA: Diagnosis not present

## 2014-02-14 DIAGNOSIS — N3090 Cystitis, unspecified without hematuria: Secondary | ICD-10-CM | POA: Diagnosis not present

## 2014-02-14 DIAGNOSIS — R1032 Left lower quadrant pain: Secondary | ICD-10-CM | POA: Diagnosis not present

## 2014-02-14 DIAGNOSIS — I10 Essential (primary) hypertension: Secondary | ICD-10-CM | POA: Diagnosis not present

## 2014-02-22 DIAGNOSIS — I10 Essential (primary) hypertension: Secondary | ICD-10-CM | POA: Diagnosis not present

## 2014-02-22 DIAGNOSIS — E119 Type 2 diabetes mellitus without complications: Secondary | ICD-10-CM | POA: Diagnosis not present

## 2014-02-22 DIAGNOSIS — R1032 Left lower quadrant pain: Secondary | ICD-10-CM | POA: Diagnosis not present

## 2014-02-22 DIAGNOSIS — E7800 Pure hypercholesterolemia, unspecified: Secondary | ICD-10-CM | POA: Diagnosis not present

## 2014-03-01 DIAGNOSIS — J309 Allergic rhinitis, unspecified: Secondary | ICD-10-CM | POA: Diagnosis not present

## 2014-03-01 DIAGNOSIS — I10 Essential (primary) hypertension: Secondary | ICD-10-CM | POA: Diagnosis not present

## 2014-03-01 DIAGNOSIS — I499 Cardiac arrhythmia, unspecified: Secondary | ICD-10-CM | POA: Diagnosis not present

## 2014-03-01 DIAGNOSIS — IMO0001 Reserved for inherently not codable concepts without codable children: Secondary | ICD-10-CM | POA: Diagnosis not present

## 2014-03-01 DIAGNOSIS — N189 Chronic kidney disease, unspecified: Secondary | ICD-10-CM | POA: Diagnosis not present

## 2014-03-01 DIAGNOSIS — E7800 Pure hypercholesterolemia, unspecified: Secondary | ICD-10-CM | POA: Diagnosis not present

## 2014-03-01 DIAGNOSIS — R1032 Left lower quadrant pain: Secondary | ICD-10-CM | POA: Diagnosis not present

## 2014-03-02 DIAGNOSIS — N201 Calculus of ureter: Secondary | ICD-10-CM | POA: Diagnosis not present

## 2014-04-19 DIAGNOSIS — M7712 Lateral epicondylitis, left elbow: Secondary | ICD-10-CM | POA: Diagnosis not present

## 2014-04-19 DIAGNOSIS — I10 Essential (primary) hypertension: Secondary | ICD-10-CM | POA: Diagnosis not present

## 2014-05-22 DIAGNOSIS — E119 Type 2 diabetes mellitus without complications: Secondary | ICD-10-CM | POA: Diagnosis not present

## 2014-05-22 DIAGNOSIS — I10 Essential (primary) hypertension: Secondary | ICD-10-CM | POA: Diagnosis not present

## 2014-05-22 DIAGNOSIS — N189 Chronic kidney disease, unspecified: Secondary | ICD-10-CM | POA: Diagnosis not present

## 2014-05-30 DIAGNOSIS — I482 Chronic atrial fibrillation: Secondary | ICD-10-CM | POA: Diagnosis not present

## 2014-05-30 DIAGNOSIS — E1122 Type 2 diabetes mellitus with diabetic chronic kidney disease: Secondary | ICD-10-CM | POA: Diagnosis not present

## 2014-05-30 DIAGNOSIS — E782 Mixed hyperlipidemia: Secondary | ICD-10-CM | POA: Diagnosis not present

## 2014-05-30 DIAGNOSIS — J309 Allergic rhinitis, unspecified: Secondary | ICD-10-CM | POA: Diagnosis not present

## 2014-05-30 DIAGNOSIS — I10 Essential (primary) hypertension: Secondary | ICD-10-CM | POA: Diagnosis not present

## 2014-08-25 DIAGNOSIS — I482 Chronic atrial fibrillation: Secondary | ICD-10-CM | POA: Diagnosis not present

## 2014-08-25 DIAGNOSIS — I10 Essential (primary) hypertension: Secondary | ICD-10-CM | POA: Diagnosis not present

## 2014-08-25 DIAGNOSIS — E782 Mixed hyperlipidemia: Secondary | ICD-10-CM | POA: Diagnosis not present

## 2014-08-25 DIAGNOSIS — N189 Chronic kidney disease, unspecified: Secondary | ICD-10-CM | POA: Diagnosis not present

## 2014-08-25 DIAGNOSIS — E1122 Type 2 diabetes mellitus with diabetic chronic kidney disease: Secondary | ICD-10-CM | POA: Diagnosis not present

## 2014-08-30 DIAGNOSIS — E1122 Type 2 diabetes mellitus with diabetic chronic kidney disease: Secondary | ICD-10-CM | POA: Diagnosis not present

## 2014-08-30 DIAGNOSIS — J309 Allergic rhinitis, unspecified: Secondary | ICD-10-CM | POA: Diagnosis not present

## 2014-08-30 DIAGNOSIS — J0100 Acute maxillary sinusitis, unspecified: Secondary | ICD-10-CM | POA: Diagnosis not present

## 2014-08-30 DIAGNOSIS — E782 Mixed hyperlipidemia: Secondary | ICD-10-CM | POA: Diagnosis not present

## 2014-08-30 DIAGNOSIS — I482 Chronic atrial fibrillation: Secondary | ICD-10-CM | POA: Diagnosis not present

## 2014-08-30 DIAGNOSIS — I10 Essential (primary) hypertension: Secondary | ICD-10-CM | POA: Diagnosis not present

## 2014-09-01 DIAGNOSIS — Z1231 Encounter for screening mammogram for malignant neoplasm of breast: Secondary | ICD-10-CM | POA: Diagnosis not present

## 2014-11-17 DIAGNOSIS — I10 Essential (primary) hypertension: Secondary | ICD-10-CM | POA: Diagnosis not present

## 2014-11-17 DIAGNOSIS — E782 Mixed hyperlipidemia: Secondary | ICD-10-CM | POA: Diagnosis not present

## 2014-11-17 DIAGNOSIS — E780 Pure hypercholesterolemia: Secondary | ICD-10-CM | POA: Diagnosis not present

## 2014-11-17 DIAGNOSIS — N189 Chronic kidney disease, unspecified: Secondary | ICD-10-CM | POA: Diagnosis not present

## 2014-11-17 DIAGNOSIS — E1122 Type 2 diabetes mellitus with diabetic chronic kidney disease: Secondary | ICD-10-CM | POA: Diagnosis not present

## 2014-11-21 DIAGNOSIS — I10 Essential (primary) hypertension: Secondary | ICD-10-CM | POA: Diagnosis not present

## 2014-11-21 DIAGNOSIS — Z1389 Encounter for screening for other disorder: Secondary | ICD-10-CM | POA: Diagnosis not present

## 2014-11-21 DIAGNOSIS — E782 Mixed hyperlipidemia: Secondary | ICD-10-CM | POA: Diagnosis not present

## 2014-11-21 DIAGNOSIS — I482 Chronic atrial fibrillation: Secondary | ICD-10-CM | POA: Diagnosis not present

## 2014-11-21 DIAGNOSIS — E1122 Type 2 diabetes mellitus with diabetic chronic kidney disease: Secondary | ICD-10-CM | POA: Diagnosis not present

## 2015-01-23 DIAGNOSIS — R202 Paresthesia of skin: Secondary | ICD-10-CM | POA: Diagnosis not present

## 2015-02-20 DIAGNOSIS — E782 Mixed hyperlipidemia: Secondary | ICD-10-CM | POA: Diagnosis not present

## 2015-02-20 DIAGNOSIS — N189 Chronic kidney disease, unspecified: Secondary | ICD-10-CM | POA: Diagnosis not present

## 2015-02-20 DIAGNOSIS — I10 Essential (primary) hypertension: Secondary | ICD-10-CM | POA: Diagnosis not present

## 2015-02-20 DIAGNOSIS — E119 Type 2 diabetes mellitus without complications: Secondary | ICD-10-CM | POA: Diagnosis not present

## 2015-02-28 DIAGNOSIS — I482 Chronic atrial fibrillation: Secondary | ICD-10-CM | POA: Diagnosis not present

## 2015-02-28 DIAGNOSIS — E1122 Type 2 diabetes mellitus with diabetic chronic kidney disease: Secondary | ICD-10-CM | POA: Diagnosis not present

## 2015-02-28 DIAGNOSIS — E782 Mixed hyperlipidemia: Secondary | ICD-10-CM | POA: Diagnosis not present

## 2015-02-28 DIAGNOSIS — I10 Essential (primary) hypertension: Secondary | ICD-10-CM | POA: Diagnosis not present

## 2015-05-23 DIAGNOSIS — E782 Mixed hyperlipidemia: Secondary | ICD-10-CM | POA: Diagnosis not present

## 2015-05-23 DIAGNOSIS — I10 Essential (primary) hypertension: Secondary | ICD-10-CM | POA: Diagnosis not present

## 2015-05-23 DIAGNOSIS — E1122 Type 2 diabetes mellitus with diabetic chronic kidney disease: Secondary | ICD-10-CM | POA: Diagnosis not present

## 2015-05-23 DIAGNOSIS — I482 Chronic atrial fibrillation: Secondary | ICD-10-CM | POA: Diagnosis not present

## 2015-06-12 DIAGNOSIS — R05 Cough: Secondary | ICD-10-CM | POA: Diagnosis not present

## 2015-06-12 DIAGNOSIS — J0100 Acute maxillary sinusitis, unspecified: Secondary | ICD-10-CM | POA: Diagnosis not present

## 2015-10-10 DIAGNOSIS — J111 Influenza due to unidentified influenza virus with other respiratory manifestations: Secondary | ICD-10-CM | POA: Diagnosis not present

## 2015-10-10 DIAGNOSIS — J0100 Acute maxillary sinusitis, unspecified: Secondary | ICD-10-CM | POA: Diagnosis not present

## 2015-10-10 DIAGNOSIS — I10 Essential (primary) hypertension: Secondary | ICD-10-CM | POA: Diagnosis not present

## 2015-10-19 DIAGNOSIS — I482 Chronic atrial fibrillation: Secondary | ICD-10-CM | POA: Diagnosis not present

## 2015-10-19 DIAGNOSIS — E1122 Type 2 diabetes mellitus with diabetic chronic kidney disease: Secondary | ICD-10-CM | POA: Diagnosis not present

## 2015-10-19 DIAGNOSIS — I10 Essential (primary) hypertension: Secondary | ICD-10-CM | POA: Diagnosis not present

## 2015-10-19 DIAGNOSIS — E7800 Pure hypercholesterolemia, unspecified: Secondary | ICD-10-CM | POA: Diagnosis not present

## 2015-10-19 DIAGNOSIS — E782 Mixed hyperlipidemia: Secondary | ICD-10-CM | POA: Diagnosis not present

## 2015-10-23 DIAGNOSIS — E1122 Type 2 diabetes mellitus with diabetic chronic kidney disease: Secondary | ICD-10-CM | POA: Diagnosis not present

## 2015-10-23 DIAGNOSIS — I10 Essential (primary) hypertension: Secondary | ICD-10-CM | POA: Diagnosis not present

## 2015-10-23 DIAGNOSIS — I482 Chronic atrial fibrillation: Secondary | ICD-10-CM | POA: Diagnosis not present

## 2015-10-23 DIAGNOSIS — E782 Mixed hyperlipidemia: Secondary | ICD-10-CM | POA: Diagnosis not present

## 2015-10-23 DIAGNOSIS — Z0000 Encounter for general adult medical examination without abnormal findings: Secondary | ICD-10-CM | POA: Diagnosis not present

## 2016-01-02 DIAGNOSIS — Z780 Asymptomatic menopausal state: Secondary | ICD-10-CM | POA: Diagnosis not present

## 2016-01-02 DIAGNOSIS — Z79899 Other long term (current) drug therapy: Secondary | ICD-10-CM | POA: Diagnosis not present

## 2016-01-02 DIAGNOSIS — I10 Essential (primary) hypertension: Secondary | ICD-10-CM | POA: Diagnosis not present

## 2016-01-02 DIAGNOSIS — Z7984 Long term (current) use of oral hypoglycemic drugs: Secondary | ICD-10-CM | POA: Diagnosis not present

## 2016-01-02 DIAGNOSIS — E119 Type 2 diabetes mellitus without complications: Secondary | ICD-10-CM | POA: Diagnosis not present

## 2016-01-02 DIAGNOSIS — M810 Age-related osteoporosis without current pathological fracture: Secondary | ICD-10-CM | POA: Diagnosis not present

## 2016-01-02 DIAGNOSIS — M1990 Unspecified osteoarthritis, unspecified site: Secondary | ICD-10-CM | POA: Diagnosis not present

## 2016-03-20 DIAGNOSIS — E1122 Type 2 diabetes mellitus with diabetic chronic kidney disease: Secondary | ICD-10-CM | POA: Diagnosis not present

## 2016-03-20 DIAGNOSIS — I10 Essential (primary) hypertension: Secondary | ICD-10-CM | POA: Diagnosis not present

## 2016-03-20 DIAGNOSIS — N189 Chronic kidney disease, unspecified: Secondary | ICD-10-CM | POA: Diagnosis not present

## 2016-03-20 DIAGNOSIS — E7800 Pure hypercholesterolemia, unspecified: Secondary | ICD-10-CM | POA: Diagnosis not present

## 2016-03-20 DIAGNOSIS — E782 Mixed hyperlipidemia: Secondary | ICD-10-CM | POA: Diagnosis not present

## 2016-03-25 DIAGNOSIS — I482 Chronic atrial fibrillation: Secondary | ICD-10-CM | POA: Diagnosis not present

## 2016-03-25 DIAGNOSIS — E782 Mixed hyperlipidemia: Secondary | ICD-10-CM | POA: Diagnosis not present

## 2016-03-25 DIAGNOSIS — Z6830 Body mass index (BMI) 30.0-30.9, adult: Secondary | ICD-10-CM | POA: Diagnosis not present

## 2016-03-25 DIAGNOSIS — E1122 Type 2 diabetes mellitus with diabetic chronic kidney disease: Secondary | ICD-10-CM | POA: Diagnosis not present

## 2016-03-25 DIAGNOSIS — I10 Essential (primary) hypertension: Secondary | ICD-10-CM | POA: Diagnosis not present

## 2016-09-02 DIAGNOSIS — Z7984 Long term (current) use of oral hypoglycemic drugs: Secondary | ICD-10-CM | POA: Diagnosis not present

## 2016-09-02 DIAGNOSIS — E1165 Type 2 diabetes mellitus with hyperglycemia: Secondary | ICD-10-CM | POA: Diagnosis not present

## 2016-09-02 DIAGNOSIS — E119 Type 2 diabetes mellitus without complications: Secondary | ICD-10-CM | POA: Diagnosis not present

## 2016-09-02 DIAGNOSIS — H25813 Combined forms of age-related cataract, bilateral: Secondary | ICD-10-CM | POA: Diagnosis not present

## 2016-09-10 DIAGNOSIS — I482 Chronic atrial fibrillation: Secondary | ICD-10-CM | POA: Diagnosis not present

## 2016-09-10 DIAGNOSIS — E1122 Type 2 diabetes mellitus with diabetic chronic kidney disease: Secondary | ICD-10-CM | POA: Diagnosis not present

## 2016-09-10 DIAGNOSIS — I10 Essential (primary) hypertension: Secondary | ICD-10-CM | POA: Diagnosis not present

## 2016-09-10 DIAGNOSIS — E7800 Pure hypercholesterolemia, unspecified: Secondary | ICD-10-CM | POA: Diagnosis not present

## 2016-09-11 DIAGNOSIS — Z79899 Other long term (current) drug therapy: Secondary | ICD-10-CM | POA: Diagnosis not present

## 2016-09-11 DIAGNOSIS — Z9114 Patient's other noncompliance with medication regimen: Secondary | ICD-10-CM | POA: Diagnosis not present

## 2016-09-11 DIAGNOSIS — R040 Epistaxis: Secondary | ICD-10-CM | POA: Diagnosis not present

## 2016-09-11 DIAGNOSIS — Z7984 Long term (current) use of oral hypoglycemic drugs: Secondary | ICD-10-CM | POA: Diagnosis not present

## 2016-09-11 DIAGNOSIS — I169 Hypertensive crisis, unspecified: Secondary | ICD-10-CM | POA: Diagnosis not present

## 2016-09-11 DIAGNOSIS — I10 Essential (primary) hypertension: Secondary | ICD-10-CM | POA: Diagnosis not present

## 2016-09-11 DIAGNOSIS — E119 Type 2 diabetes mellitus without complications: Secondary | ICD-10-CM | POA: Diagnosis not present

## 2016-09-11 DIAGNOSIS — J019 Acute sinusitis, unspecified: Secondary | ICD-10-CM | POA: Diagnosis not present

## 2016-09-12 DIAGNOSIS — E782 Mixed hyperlipidemia: Secondary | ICD-10-CM | POA: Diagnosis not present

## 2016-09-12 DIAGNOSIS — E1122 Type 2 diabetes mellitus with diabetic chronic kidney disease: Secondary | ICD-10-CM | POA: Diagnosis not present

## 2016-09-12 DIAGNOSIS — I10 Essential (primary) hypertension: Secondary | ICD-10-CM | POA: Diagnosis not present

## 2016-09-12 DIAGNOSIS — I482 Chronic atrial fibrillation: Secondary | ICD-10-CM | POA: Diagnosis not present

## 2016-09-12 DIAGNOSIS — Z6830 Body mass index (BMI) 30.0-30.9, adult: Secondary | ICD-10-CM | POA: Diagnosis not present

## 2017-01-27 DIAGNOSIS — J0100 Acute maxillary sinusitis, unspecified: Secondary | ICD-10-CM | POA: Diagnosis not present

## 2017-01-27 DIAGNOSIS — Z6830 Body mass index (BMI) 30.0-30.9, adult: Secondary | ICD-10-CM | POA: Diagnosis not present

## 2017-01-27 DIAGNOSIS — M7062 Trochanteric bursitis, left hip: Secondary | ICD-10-CM | POA: Diagnosis not present

## 2017-01-27 DIAGNOSIS — M545 Low back pain: Secondary | ICD-10-CM | POA: Diagnosis not present

## 2017-01-27 DIAGNOSIS — I708 Atherosclerosis of other arteries: Secondary | ICD-10-CM | POA: Diagnosis not present

## 2017-01-29 DIAGNOSIS — E782 Mixed hyperlipidemia: Secondary | ICD-10-CM | POA: Diagnosis not present

## 2017-01-29 DIAGNOSIS — I482 Chronic atrial fibrillation: Secondary | ICD-10-CM | POA: Diagnosis not present

## 2017-01-29 DIAGNOSIS — E1122 Type 2 diabetes mellitus with diabetic chronic kidney disease: Secondary | ICD-10-CM | POA: Diagnosis not present

## 2017-01-29 DIAGNOSIS — N189 Chronic kidney disease, unspecified: Secondary | ICD-10-CM | POA: Diagnosis not present

## 2017-01-29 DIAGNOSIS — I10 Essential (primary) hypertension: Secondary | ICD-10-CM | POA: Diagnosis not present

## 2017-01-29 DIAGNOSIS — E7800 Pure hypercholesterolemia, unspecified: Secondary | ICD-10-CM | POA: Diagnosis not present

## 2017-02-06 DIAGNOSIS — E782 Mixed hyperlipidemia: Secondary | ICD-10-CM | POA: Diagnosis not present

## 2017-02-06 DIAGNOSIS — Z6830 Body mass index (BMI) 30.0-30.9, adult: Secondary | ICD-10-CM | POA: Diagnosis not present

## 2017-02-06 DIAGNOSIS — I482 Chronic atrial fibrillation: Secondary | ICD-10-CM | POA: Diagnosis not present

## 2017-02-06 DIAGNOSIS — Z1212 Encounter for screening for malignant neoplasm of rectum: Secondary | ICD-10-CM | POA: Diagnosis not present

## 2017-02-06 DIAGNOSIS — E1122 Type 2 diabetes mellitus with diabetic chronic kidney disease: Secondary | ICD-10-CM | POA: Diagnosis not present

## 2017-02-06 DIAGNOSIS — I10 Essential (primary) hypertension: Secondary | ICD-10-CM | POA: Diagnosis not present

## 2017-05-08 DIAGNOSIS — I482 Chronic atrial fibrillation: Secondary | ICD-10-CM | POA: Diagnosis not present

## 2017-05-08 DIAGNOSIS — I10 Essential (primary) hypertension: Secondary | ICD-10-CM | POA: Diagnosis not present

## 2017-05-08 DIAGNOSIS — E7800 Pure hypercholesterolemia, unspecified: Secondary | ICD-10-CM | POA: Diagnosis not present

## 2017-05-08 DIAGNOSIS — E1122 Type 2 diabetes mellitus with diabetic chronic kidney disease: Secondary | ICD-10-CM | POA: Diagnosis not present

## 2017-05-08 DIAGNOSIS — E782 Mixed hyperlipidemia: Secondary | ICD-10-CM | POA: Diagnosis not present

## 2017-05-08 DIAGNOSIS — N189 Chronic kidney disease, unspecified: Secondary | ICD-10-CM | POA: Diagnosis not present

## 2017-05-13 DIAGNOSIS — E1122 Type 2 diabetes mellitus with diabetic chronic kidney disease: Secondary | ICD-10-CM | POA: Diagnosis not present

## 2017-05-19 DIAGNOSIS — Z1212 Encounter for screening for malignant neoplasm of rectum: Secondary | ICD-10-CM | POA: Diagnosis not present

## 2017-05-19 DIAGNOSIS — I482 Chronic atrial fibrillation: Secondary | ICD-10-CM | POA: Diagnosis not present

## 2017-05-19 DIAGNOSIS — Z6830 Body mass index (BMI) 30.0-30.9, adult: Secondary | ICD-10-CM | POA: Diagnosis not present

## 2017-05-19 DIAGNOSIS — I10 Essential (primary) hypertension: Secondary | ICD-10-CM | POA: Diagnosis not present

## 2017-05-19 DIAGNOSIS — E1122 Type 2 diabetes mellitus with diabetic chronic kidney disease: Secondary | ICD-10-CM | POA: Diagnosis not present

## 2017-05-19 DIAGNOSIS — E782 Mixed hyperlipidemia: Secondary | ICD-10-CM | POA: Diagnosis not present

## 2017-05-19 DIAGNOSIS — J0100 Acute maxillary sinusitis, unspecified: Secondary | ICD-10-CM | POA: Diagnosis not present

## 2017-08-10 DIAGNOSIS — Z6828 Body mass index (BMI) 28.0-28.9, adult: Secondary | ICD-10-CM | POA: Diagnosis not present

## 2017-08-10 DIAGNOSIS — R2231 Localized swelling, mass and lump, right upper limb: Secondary | ICD-10-CM | POA: Diagnosis not present

## 2017-08-13 DIAGNOSIS — E1122 Type 2 diabetes mellitus with diabetic chronic kidney disease: Secondary | ICD-10-CM | POA: Diagnosis not present

## 2017-08-13 DIAGNOSIS — E7800 Pure hypercholesterolemia, unspecified: Secondary | ICD-10-CM | POA: Diagnosis not present

## 2017-08-13 DIAGNOSIS — I482 Chronic atrial fibrillation: Secondary | ICD-10-CM | POA: Diagnosis not present

## 2017-08-13 DIAGNOSIS — E782 Mixed hyperlipidemia: Secondary | ICD-10-CM | POA: Diagnosis not present

## 2017-08-13 DIAGNOSIS — N189 Chronic kidney disease, unspecified: Secondary | ICD-10-CM | POA: Diagnosis not present

## 2017-08-13 DIAGNOSIS — I10 Essential (primary) hypertension: Secondary | ICD-10-CM | POA: Diagnosis not present

## 2017-08-18 DIAGNOSIS — I482 Chronic atrial fibrillation: Secondary | ICD-10-CM | POA: Diagnosis not present

## 2017-08-18 DIAGNOSIS — Z6829 Body mass index (BMI) 29.0-29.9, adult: Secondary | ICD-10-CM | POA: Diagnosis not present

## 2017-08-18 DIAGNOSIS — E782 Mixed hyperlipidemia: Secondary | ICD-10-CM | POA: Diagnosis not present

## 2017-08-18 DIAGNOSIS — J0100 Acute maxillary sinusitis, unspecified: Secondary | ICD-10-CM | POA: Diagnosis not present

## 2017-08-18 DIAGNOSIS — I10 Essential (primary) hypertension: Secondary | ICD-10-CM | POA: Diagnosis not present

## 2017-08-18 DIAGNOSIS — E1122 Type 2 diabetes mellitus with diabetic chronic kidney disease: Secondary | ICD-10-CM | POA: Diagnosis not present

## 2017-08-19 DIAGNOSIS — N6459 Other signs and symptoms in breast: Secondary | ICD-10-CM | POA: Diagnosis not present

## 2017-08-19 DIAGNOSIS — R234 Changes in skin texture: Secondary | ICD-10-CM | POA: Diagnosis not present

## 2017-08-19 DIAGNOSIS — R590 Localized enlarged lymph nodes: Secondary | ICD-10-CM | POA: Diagnosis not present

## 2017-08-19 DIAGNOSIS — N6489 Other specified disorders of breast: Secondary | ICD-10-CM | POA: Diagnosis not present

## 2017-08-19 DIAGNOSIS — R922 Inconclusive mammogram: Secondary | ICD-10-CM | POA: Diagnosis not present

## 2017-11-05 DIAGNOSIS — E1122 Type 2 diabetes mellitus with diabetic chronic kidney disease: Secondary | ICD-10-CM | POA: Diagnosis not present

## 2017-11-05 DIAGNOSIS — Z6828 Body mass index (BMI) 28.0-28.9, adult: Secondary | ICD-10-CM | POA: Diagnosis not present

## 2017-11-05 DIAGNOSIS — I482 Chronic atrial fibrillation: Secondary | ICD-10-CM | POA: Diagnosis not present

## 2017-11-05 DIAGNOSIS — Z0001 Encounter for general adult medical examination with abnormal findings: Secondary | ICD-10-CM | POA: Diagnosis not present

## 2017-11-05 DIAGNOSIS — E782 Mixed hyperlipidemia: Secondary | ICD-10-CM | POA: Diagnosis not present

## 2017-11-05 DIAGNOSIS — E7800 Pure hypercholesterolemia, unspecified: Secondary | ICD-10-CM | POA: Diagnosis not present

## 2017-11-05 DIAGNOSIS — I10 Essential (primary) hypertension: Secondary | ICD-10-CM | POA: Diagnosis not present

## 2017-11-05 DIAGNOSIS — N189 Chronic kidney disease, unspecified: Secondary | ICD-10-CM | POA: Diagnosis not present

## 2017-11-17 DIAGNOSIS — Z6829 Body mass index (BMI) 29.0-29.9, adult: Secondary | ICD-10-CM | POA: Diagnosis not present

## 2017-11-17 DIAGNOSIS — I482 Chronic atrial fibrillation: Secondary | ICD-10-CM | POA: Diagnosis not present

## 2017-11-17 DIAGNOSIS — Z0000 Encounter for general adult medical examination without abnormal findings: Secondary | ICD-10-CM | POA: Diagnosis not present

## 2017-11-17 DIAGNOSIS — J0100 Acute maxillary sinusitis, unspecified: Secondary | ICD-10-CM | POA: Diagnosis not present

## 2017-11-17 DIAGNOSIS — Z1212 Encounter for screening for malignant neoplasm of rectum: Secondary | ICD-10-CM | POA: Diagnosis not present

## 2017-11-17 DIAGNOSIS — E1122 Type 2 diabetes mellitus with diabetic chronic kidney disease: Secondary | ICD-10-CM | POA: Diagnosis not present

## 2017-11-17 DIAGNOSIS — Z6830 Body mass index (BMI) 30.0-30.9, adult: Secondary | ICD-10-CM | POA: Diagnosis not present

## 2017-11-17 DIAGNOSIS — E782 Mixed hyperlipidemia: Secondary | ICD-10-CM | POA: Diagnosis not present

## 2017-11-17 DIAGNOSIS — I10 Essential (primary) hypertension: Secondary | ICD-10-CM | POA: Diagnosis not present

## 2017-12-01 DIAGNOSIS — Z6828 Body mass index (BMI) 28.0-28.9, adult: Secondary | ICD-10-CM | POA: Diagnosis not present

## 2017-12-01 DIAGNOSIS — S1096XA Insect bite of unspecified part of neck, initial encounter: Secondary | ICD-10-CM | POA: Diagnosis not present

## 2018-02-10 DIAGNOSIS — E782 Mixed hyperlipidemia: Secondary | ICD-10-CM | POA: Diagnosis not present

## 2018-02-10 DIAGNOSIS — E1122 Type 2 diabetes mellitus with diabetic chronic kidney disease: Secondary | ICD-10-CM | POA: Diagnosis not present

## 2018-02-10 DIAGNOSIS — E7801 Familial hypercholesterolemia: Secondary | ICD-10-CM | POA: Diagnosis not present

## 2018-02-10 DIAGNOSIS — I482 Chronic atrial fibrillation: Secondary | ICD-10-CM | POA: Diagnosis not present

## 2018-02-10 DIAGNOSIS — I10 Essential (primary) hypertension: Secondary | ICD-10-CM | POA: Diagnosis not present

## 2018-02-10 DIAGNOSIS — N189 Chronic kidney disease, unspecified: Secondary | ICD-10-CM | POA: Diagnosis not present

## 2018-02-15 DIAGNOSIS — E875 Hyperkalemia: Secondary | ICD-10-CM | POA: Diagnosis not present

## 2018-02-17 DIAGNOSIS — Z6829 Body mass index (BMI) 29.0-29.9, adult: Secondary | ICD-10-CM | POA: Diagnosis not present

## 2018-02-17 DIAGNOSIS — E1122 Type 2 diabetes mellitus with diabetic chronic kidney disease: Secondary | ICD-10-CM | POA: Diagnosis not present

## 2018-02-17 DIAGNOSIS — I10 Essential (primary) hypertension: Secondary | ICD-10-CM | POA: Diagnosis not present

## 2018-02-17 DIAGNOSIS — E782 Mixed hyperlipidemia: Secondary | ICD-10-CM | POA: Diagnosis not present

## 2018-02-17 DIAGNOSIS — E876 Hypokalemia: Secondary | ICD-10-CM | POA: Diagnosis not present

## 2018-02-17 DIAGNOSIS — I482 Chronic atrial fibrillation: Secondary | ICD-10-CM | POA: Diagnosis not present

## 2018-03-29 DIAGNOSIS — Z7984 Long term (current) use of oral hypoglycemic drugs: Secondary | ICD-10-CM | POA: Diagnosis not present

## 2018-03-29 DIAGNOSIS — E1165 Type 2 diabetes mellitus with hyperglycemia: Secondary | ICD-10-CM | POA: Diagnosis not present

## 2018-03-29 DIAGNOSIS — E119 Type 2 diabetes mellitus without complications: Secondary | ICD-10-CM | POA: Diagnosis not present

## 2018-03-29 DIAGNOSIS — H25813 Combined forms of age-related cataract, bilateral: Secondary | ICD-10-CM | POA: Diagnosis not present

## 2018-04-27 DIAGNOSIS — I10 Essential (primary) hypertension: Secondary | ICD-10-CM | POA: Diagnosis not present

## 2018-04-27 DIAGNOSIS — Z7984 Long term (current) use of oral hypoglycemic drugs: Secondary | ICD-10-CM | POA: Diagnosis not present

## 2018-04-27 DIAGNOSIS — E119 Type 2 diabetes mellitus without complications: Secondary | ICD-10-CM | POA: Diagnosis not present

## 2018-04-27 DIAGNOSIS — R9431 Abnormal electrocardiogram [ECG] [EKG]: Secondary | ICD-10-CM | POA: Diagnosis not present

## 2018-04-27 DIAGNOSIS — Z87442 Personal history of urinary calculi: Secondary | ICD-10-CM | POA: Diagnosis not present

## 2018-04-27 DIAGNOSIS — R1032 Left lower quadrant pain: Secondary | ICD-10-CM | POA: Diagnosis not present

## 2018-04-27 DIAGNOSIS — K5792 Diverticulitis of intestine, part unspecified, without perforation or abscess without bleeding: Secondary | ICD-10-CM | POA: Diagnosis not present

## 2018-04-27 DIAGNOSIS — N200 Calculus of kidney: Secondary | ICD-10-CM | POA: Diagnosis not present

## 2018-04-27 DIAGNOSIS — Z79899 Other long term (current) drug therapy: Secondary | ICD-10-CM | POA: Diagnosis not present

## 2018-05-19 DIAGNOSIS — E1122 Type 2 diabetes mellitus with diabetic chronic kidney disease: Secondary | ICD-10-CM | POA: Diagnosis not present

## 2018-05-19 DIAGNOSIS — I10 Essential (primary) hypertension: Secondary | ICD-10-CM | POA: Diagnosis not present

## 2018-05-19 DIAGNOSIS — E782 Mixed hyperlipidemia: Secondary | ICD-10-CM | POA: Diagnosis not present

## 2018-05-19 DIAGNOSIS — E7800 Pure hypercholesterolemia, unspecified: Secondary | ICD-10-CM | POA: Diagnosis not present

## 2018-06-04 DIAGNOSIS — I10 Essential (primary) hypertension: Secondary | ICD-10-CM | POA: Diagnosis not present

## 2018-06-04 DIAGNOSIS — E1122 Type 2 diabetes mellitus with diabetic chronic kidney disease: Secondary | ICD-10-CM | POA: Diagnosis not present

## 2018-06-04 DIAGNOSIS — E876 Hypokalemia: Secondary | ICD-10-CM | POA: Diagnosis not present

## 2018-06-04 DIAGNOSIS — E782 Mixed hyperlipidemia: Secondary | ICD-10-CM | POA: Diagnosis not present

## 2018-06-04 DIAGNOSIS — I482 Chronic atrial fibrillation, unspecified: Secondary | ICD-10-CM | POA: Diagnosis not present

## 2018-06-04 DIAGNOSIS — Z6829 Body mass index (BMI) 29.0-29.9, adult: Secondary | ICD-10-CM | POA: Diagnosis not present

## 2018-07-30 ENCOUNTER — Telehealth: Payer: Self-pay | Admitting: Cardiovascular Disease

## 2018-07-30 NOTE — Telephone Encounter (Signed)
Called patient to confirm appt

## 2018-08-02 ENCOUNTER — Ambulatory Visit (INDEPENDENT_AMBULATORY_CARE_PROVIDER_SITE_OTHER): Payer: Medicare Other | Admitting: Cardiovascular Disease

## 2018-08-02 ENCOUNTER — Encounter: Payer: Self-pay | Admitting: Cardiovascular Disease

## 2018-08-02 VITALS — BP 156/90 | HR 93 | Ht 61.0 in | Wt 156.0 lb

## 2018-08-02 DIAGNOSIS — R0609 Other forms of dyspnea: Secondary | ICD-10-CM | POA: Diagnosis not present

## 2018-08-02 DIAGNOSIS — Z7189 Other specified counseling: Secondary | ICD-10-CM | POA: Diagnosis not present

## 2018-08-02 DIAGNOSIS — I4891 Unspecified atrial fibrillation: Secondary | ICD-10-CM

## 2018-08-02 DIAGNOSIS — E7800 Pure hypercholesterolemia, unspecified: Secondary | ICD-10-CM | POA: Diagnosis not present

## 2018-08-02 DIAGNOSIS — I10 Essential (primary) hypertension: Secondary | ICD-10-CM

## 2018-08-02 MED ORDER — DILTIAZEM HCL 60 MG PO TABS: 60.0000 mg | ORAL_TABLET | Freq: Two times a day (BID) | ORAL | 2 refills | Status: DC

## 2018-08-02 MED ORDER — APIXABAN 5 MG PO TABS: 5.0000 mg | ORAL_TABLET | Freq: Two times a day (BID) | ORAL | 0 refills | Status: DC

## 2018-08-02 MED ORDER — APIXABAN 5 MG PO TABS: 5.0000 mg | ORAL_TABLET | Freq: Two times a day (BID) | ORAL | 3 refills | Status: DC

## 2018-08-02 NOTE — Progress Notes (Signed)
CARDIOLOGY CONSULT NOTE  Patient ID: Betty Goodman MRN: [id number] DOB/AGE: 03-07-1946 73 y.o.  Admit date: (Not on file) Primary Physician: Manon Hilding, MD Referring Physician: Jalene Mullet, PA-C.   Reason for Consultation: Atrial fibrillation  HPI: Betty Goodman is a 73 y.o. female who is being seen today for the evaluation of atrial fibrillation at the request of Jalene Mullet, PA-C.   I evaluated this patient once before in February 2015.  Past medical history also includes hypertension, type 2 diabetes mellitus, and hyperlipidemia.  ECG performed in the office today which I ordered and personally interpreted demonstrates atrial fibrillation, 107 bpm, PVCs, old inferior infarct pattern, and late R wave transition.  She is here with her daughter, Betty Goodman, from the [place] area.  The patient tell me her husband passed away 8 months ago as did her dog and cat.  She lives alone.  She has had progressive exertional dyspnea over the past 2 weeks.  When she stops walking symptoms resolved.  She has had some bilateral hand tingling and numbness.  She denies leg swelling, orthopnea, and paroxysmal nocturnal dyspnea.  She has occasional palpitations and chest pains occurring once every 6 months.  Her daughter told me that the patient sometimes forgets to take her evening medications and when she is traveling, she may put all of her medications in 1 bottle.  Her daughter plans to purchase a pillbox for her mother.  The patient denies any further issues with vaginal bleeding.    I will have to request a copy of labs.  Social history: She is originally from [place], [place].  She is widowed.  Her husband passed away in 10/18/17.  She has an adult daughter, Betty Goodman, who lives in the [place] area.  Allergies  Allergen Reactions  . Sulfa Antibiotics     Current Outpatient Medications  Medication Sig Dispense Refill  . alendronate (FOSAMAX) 70 MG tablet Take 70 mg by mouth  once a week. Take with a full glass of water on an empty stomach.    Marland Kitchen atorvastatin (LIPITOR) 40 MG tablet Take 40 mg by mouth daily.    . cloNIDine (CATAPRES) 0.1 MG tablet Take 0.2 mg by mouth 3 (three) times daily.     . fluticasone (FLONASE) 50 MCG/ACT nasal spray Place 2 sprays into the nose daily.    Marland Kitchen glipiZIDE (GLUCOTROL) 5 MG tablet Take 5 mg by mouth daily before breakfast.    . Liraglutide (VICTOZA [place]) Inject 1.2 mg into the skin daily.    Marland Kitchen lisinopril-hydrochlorothiazide (PRINZIDE,ZESTORETIC) 20-12.5 MG per tablet Take 1 tablet by mouth 2 (two) times daily. 60 tablet 3  . loratadine (CLARITIN) 10 MG tablet Take 10 mg by mouth daily.    . metFORMIN (GLUCOPHAGE) 1000 MG tablet Take 1,000 mg by mouth 2 (two) times daily with a meal.     No current facility-administered medications for this visit.     Past Medical History:  Diagnosis Date  . Atrial fibrillation ([place])   . Kidney stone    resulting in stenting  . Type 2 diabetes mellitus ([place])   . Unspecified essential hypertension     Past Surgical History:  Procedure Laterality Date  . ADENOIDECTOMY    . TONSILLECTOMY    . TUBAL LIGATION      Social History   Socioeconomic History  . Marital status: Married    Spouse name: Not on file  . Number of children:  4  . Years of education: Not on file  . Highest education level: Not on file  Occupational History  . Occupation: Retired  Scientific laboratory technician  . Financial resource strain: Not on file  . Food insecurity:    Worry: Not on file    Inability: Not on file  . Transportation needs:    Medical: Not on file    Non-medical: Not on file  Tobacco Use  . Smoking status: Never Smoker  . Smokeless tobacco: Never Used  Substance and Sexual Activity  . Alcohol use: No  . Drug use: Not on file  . Sexual activity: Not on file  Lifestyle  . Physical activity:    Days per week: Not on file    Minutes per session: Not on file  . Stress: Not on file  Relationships  . Social  connections:    Talks on phone: Not on file    Gets together: Not on file    Attends religious service: Not on file    Active member of club or organization: Not on file    Attends meetings of clubs or organizations: Not on file    Relationship status: Not on file  . Intimate partner violence:    Fear of current or ex partner: Not on file    Emotionally abused: Not on file    Physically abused: Not on file    Forced sexual activity: Not on file  Other Topics Concern  . Not on file  Social History Narrative   Lives with husband one daughter and two grandchildren.     No family history of premature CAD in 1st degree relatives.  Current Meds  Medication Sig  . alendronate (FOSAMAX) 70 MG tablet Take 70 mg by mouth once a week. Take with a full glass of water on an empty stomach.  Marland Kitchen atorvastatin (LIPITOR) 40 MG tablet Take 40 mg by mouth daily.  . cloNIDine (CATAPRES) 0.1 MG tablet Take 0.2 mg by mouth 3 (three) times daily.   . fluticasone (FLONASE) 50 MCG/ACT nasal spray Place 2 sprays into the nose daily.  Marland Kitchen glipiZIDE (GLUCOTROL) 5 MG tablet Take 5 mg by mouth daily before breakfast.  . Liraglutide (VICTOZA [place]) Inject 1.2 mg into the skin daily.  Marland Kitchen lisinopril-hydrochlorothiazide (PRINZIDE,ZESTORETIC) 20-12.5 MG per tablet Take 1 tablet by mouth 2 (two) times daily.  Marland Kitchen loratadine (CLARITIN) 10 MG tablet Take 10 mg by mouth daily.  . metFORMIN (GLUCOPHAGE) 1000 MG tablet Take 1,000 mg by mouth 2 (two) times daily with a meal.      Review of systems complete and found to be negative unless listed above in HPI    Physical exam Blood pressure (!) 156/90, pulse 93, height 5\' 1"  (1.549 m), weight 156 lb (70.8 kg), SpO2 98 %. General: NAD Neck: No JVD, no thyromegaly or thyroid nodule.  Lungs: Clear to auscultation bilaterally with normal respiratory effort. CV: Tachycardic, irregular rhythm, normal S1/S2, no S3, no murmur.  No peripheral edema.  No carotid bruit.    Abdomen:  Soft, nontender, no distention.  Skin: Intact without lesions or rashes.  Neurologic: Alert and oriented x 3.  Psych: Normal affect. Extremities: No clubbing or cyanosis.  HEENT: Normal.   ECG: Most recent ECG reviewed.   Labs: Lab Results  Component Value Date/Time   K 3.9 09/19/2013 02:27 PM   BUN 23 09/19/2013 02:27 PM   CREATININE 0.84 09/19/2013 02:27 PM   HGB 13.4 09/19/2013 02:27 PM  Lipids: No results found for: LDLCALC, LDLDIRECT, CHOL, TRIG, HDL      ASSESSMENT AND PLAN:   1.  Rapid atrial fibrillation: She has exertional dyspnea and is not on AV nodal blocking agents nor systemic anticoagulation.  I will start diltiazem 60 mg twice daily.  I will also initiate apixaban 5 mg twice daily.  I will obtain a copy of labs from her PCP.  Once her heart rate is controlled I will obtain an echocardiogram to evaluate cardiac structure and function.  2.  Hypertension: Blood pressure is elevated.  Currently on Zestoretic and clonidine.  I will monitor as I am adding diltiazem.  3.  Hyperlipidemia: Continue atorvastatin.    Disposition: Follow up in 3 months  Signed: Kate Sable, M.D., F.A.C.C.  08/02/2018, 8:56 AM

## 2018-08-02 NOTE — Patient Instructions (Signed)
Medication Instructions:   Begin Diltiazem 60mg  twice a day (SA).  Begin Eliquis 5mg  twice a day.  Continue all other medications.    Labwork: none  Testing/Procedures:  Your physician has requested that you have an echocardiogram. Echocardiography is a painless test that uses sound waves to create images of your heart. It provides your doctor with information about the size and shape of your heart and how well your heart's chambers and valves are working. This procedure takes approximately one hour. There are no restrictions for this procedure. (can do in 1 month)  Office will contact with results via phone or letter.    Follow-Up: 3 months   Any Other Special Instructions Will Be Listed Below (If Applicable). 1 month follow up with anticoagulation nurse  If you need a refill on your cardiac medications before your next appointment, please call your pharmacy.

## 2018-08-03 ENCOUNTER — Telehealth: Payer: Self-pay | Admitting: Cardiovascular Disease

## 2018-08-03 MED ORDER — APIXABAN 5 MG PO TABS: 5.0000 mg | ORAL_TABLET | Freq: Two times a day (BID) | ORAL | 0 refills | Status: AC

## 2018-08-03 MED ORDER — APIXABAN 5 MG PO TABS: 5.0000 mg | ORAL_TABLET | Freq: Two times a day (BID) | ORAL | 3 refills | Status: DC

## 2018-08-03 NOTE — Telephone Encounter (Signed)
Medication sent to pharmacy  

## 2018-08-03 NOTE — Addendum Note (Signed)
Addended by: Julian Hy T on: 08/03/2018 09:53 AM   Modules accepted: Orders

## 2018-08-03 NOTE — Telephone Encounter (Signed)
Patient called stating that she went to pick up apixaban (ELIQUIS) 5 MG TABS tablet at [facility]. Was told her copay would be $156.00. She wants to see if the RX can be faxed to the [facility].

## 2018-08-03 NOTE — Telephone Encounter (Signed)
Pt also wanted to add to medication list amlodipine 10 mg daily and Coreg CR 20 mg daily - was unable to start Eliquis until she receives rx from [facility] - will provide samples

## 2018-08-05 ENCOUNTER — Encounter: Payer: Self-pay | Admitting: *Deleted

## 2018-08-12 ENCOUNTER — Ambulatory Visit (INDEPENDENT_AMBULATORY_CARE_PROVIDER_SITE_OTHER): Payer: Medicare Other

## 2018-08-12 ENCOUNTER — Telehealth: Payer: Self-pay | Admitting: Cardiovascular Disease

## 2018-08-12 DIAGNOSIS — I4891 Unspecified atrial fibrillation: Secondary | ICD-10-CM | POA: Diagnosis not present

## 2018-08-12 NOTE — Telephone Encounter (Signed)
Would like to know if she can take Naproxen or something similar when he arthritis and bursitis flares up

## 2018-08-12 NOTE — Telephone Encounter (Signed)
So long it is not frequent usage.

## 2018-08-12 NOTE — Telephone Encounter (Signed)
That would be fine 

## 2018-08-12 NOTE — Telephone Encounter (Signed)
Patient informed and verbalized understanding of plan. 

## 2018-08-18 ENCOUNTER — Other Ambulatory Visit: Payer: Self-pay | Admitting: *Deleted

## 2018-08-18 ENCOUNTER — Telehealth: Payer: Self-pay | Admitting: *Deleted

## 2018-08-18 MED ORDER — DILTIAZEM HCL 60 MG PO TABS: 60.0000 mg | ORAL_TABLET | Freq: Two times a day (BID) | ORAL | 3 refills | Status: DC

## 2018-08-18 NOTE — Telephone Encounter (Signed)
Notes recorded by Laurine Blazer, LPN on 10/15/6188 at 1:22 PM EST Patient notified. Copy to pmd. Follow up scheduled for 11/08/18 with Dr. Paralee Cancel office. ------  Notes recorded by Laurine Blazer, LPN on 2/41/1464 at 3:14 PM EST Left message to return call.  ------  Notes recorded by Herminio Commons, MD on 08/12/2018 at 4:34 PM EST Normal pumping function. Some valve leakage noted. No changes to therapy.

## 2018-08-25 DIAGNOSIS — M25551 Pain in right hip: Secondary | ICD-10-CM | POA: Diagnosis not present

## 2018-08-25 DIAGNOSIS — Z6828 Body mass index (BMI) 28.0-28.9, adult: Secondary | ICD-10-CM | POA: Diagnosis not present

## 2018-08-27 DIAGNOSIS — E782 Mixed hyperlipidemia: Secondary | ICD-10-CM | POA: Diagnosis not present

## 2018-08-27 DIAGNOSIS — E1122 Type 2 diabetes mellitus with diabetic chronic kidney disease: Secondary | ICD-10-CM | POA: Diagnosis not present

## 2018-08-27 DIAGNOSIS — E7800 Pure hypercholesterolemia, unspecified: Secondary | ICD-10-CM | POA: Diagnosis not present

## 2018-08-27 DIAGNOSIS — E876 Hypokalemia: Secondary | ICD-10-CM | POA: Diagnosis not present

## 2018-08-27 DIAGNOSIS — I10 Essential (primary) hypertension: Secondary | ICD-10-CM | POA: Diagnosis not present

## 2018-08-27 DIAGNOSIS — N189 Chronic kidney disease, unspecified: Secondary | ICD-10-CM | POA: Diagnosis not present

## 2018-08-27 DIAGNOSIS — E7801 Familial hypercholesterolemia: Secondary | ICD-10-CM | POA: Diagnosis not present

## 2018-08-31 NOTE — Progress Notes (Deleted)
Pt was started on Eliquis for *** on {DATE MONTH DAY YYTK:[id number]}.    Reviewed patients medication list.  Pt {Is/is not:9024} currently on any combined P-gp and strong CYP3A4 inhibitors/inducers (ketoconazole, traconazole, ritonavir, carbamazepine, phenytoin, rifampin, St. John's wort).  Reviewed labs.  SCr ***, Weight ***, CrCl- ***.  Dose {Desc; appropriate/inappropriate:30686::"appropriate"} based on age, weight, and SCr.  Hgb and HCT {XNT:70017}  A full discussion of the nature of anticoagulants has been carried out.  A benefit/risk analysis has been presented to the patient, so that they understand the justification for choosing anticoagulation with Eliquis at this time.  The need for compliance is stressed.  Pt is aware to take the medication twice daily.  Side effects of potential bleeding are discussed, including unusual colored urine or stools, coughing up blood or coffee ground emesis, nose bleeds or serious fall or head trauma.  Discussed signs and symptoms of stroke. The patient should avoid any OTC items containing aspirin or ibuprofen.  Avoid alcohol consumption.   Call if any signs of abnormal bleeding.  Discussed financial obligations and resolved any difficulty in obtaining medication.  Next lab test in 6 months.

## 2018-09-01 DIAGNOSIS — Z6828 Body mass index (BMI) 28.0-28.9, adult: Secondary | ICD-10-CM | POA: Diagnosis not present

## 2018-09-01 DIAGNOSIS — E876 Hypokalemia: Secondary | ICD-10-CM | POA: Diagnosis not present

## 2018-09-01 DIAGNOSIS — E1122 Type 2 diabetes mellitus with diabetic chronic kidney disease: Secondary | ICD-10-CM | POA: Diagnosis not present

## 2018-09-01 DIAGNOSIS — I4891 Unspecified atrial fibrillation: Secondary | ICD-10-CM | POA: Diagnosis not present

## 2018-09-01 DIAGNOSIS — I10 Essential (primary) hypertension: Secondary | ICD-10-CM | POA: Diagnosis not present

## 2018-09-01 DIAGNOSIS — E782 Mixed hyperlipidemia: Secondary | ICD-10-CM | POA: Diagnosis not present

## 2018-09-02 ENCOUNTER — Ambulatory Visit (INDEPENDENT_AMBULATORY_CARE_PROVIDER_SITE_OTHER): Payer: Medicare Other | Admitting: *Deleted

## 2018-09-02 DIAGNOSIS — Z5181 Encounter for therapeutic drug level monitoring: Secondary | ICD-10-CM | POA: Diagnosis not present

## 2018-09-02 DIAGNOSIS — I4821 Permanent atrial fibrillation: Secondary | ICD-10-CM | POA: Diagnosis not present

## 2018-09-02 NOTE — Progress Notes (Signed)
Pt was started on Eliquis 5mg  bid for atrial fibrillation by Dr Bronson Ing on 08/02/2018.    Reviewed patients medication list.  Pt is not currently on any combined P-gp and strong CYP3A4 inhibitors/inducers (ketoconazole, traconazole, ritonavir, carbamazepine, phenytoin, rifampin, St. John's wort).  Reviewed labs 08/27/18 from Dr Edythe Lynn office.  SCr 1.08, Weight 71.5 kg, CrCl 53.93.  Dose is appropriate based on age, weight, and SCr.  A full discussion of the nature of anticoagulants has been carried out.  A benefit/risk analysis has been presented to the patient, so that they understand the justification for choosing anticoagulation with Eliquis at this time.  The need for compliance is stressed.  Pt is aware to take the medication twice daily.  Side effects of potential bleeding are discussed, including unusual colored urine or stools, coughing up blood or coffee ground emesis, nose bleeds or serious fall or head trauma.  Discussed signs and symptoms of stroke. The patient should avoid any OTC items containing aspirin or ibuprofen.  Avoid alcohol consumption.   Call if any signs of abnormal bleeding.  Discussed financial obligations and resolved any difficulty in obtaining medication.  Next lab test in 6 months. Appt made.

## 2018-10-11 DIAGNOSIS — E1142 Type 2 diabetes mellitus with diabetic polyneuropathy: Secondary | ICD-10-CM | POA: Diagnosis not present

## 2018-10-11 DIAGNOSIS — E782 Mixed hyperlipidemia: Secondary | ICD-10-CM | POA: Diagnosis not present

## 2018-10-11 DIAGNOSIS — E1122 Type 2 diabetes mellitus with diabetic chronic kidney disease: Secondary | ICD-10-CM | POA: Diagnosis not present

## 2018-11-03 ENCOUNTER — Telehealth: Payer: Self-pay | Admitting: *Deleted

## 2018-11-03 NOTE — Telephone Encounter (Signed)
Pt contacted per Bronson Ing. History reviewed. No symptoms to suggest any unstable cardiac conditions. Based on discussion, with current pandemic situation, we have postponed 11/08/18 appointment until July 2020. If symptoms change, pt has been instructed to contact our office

## 2018-11-05 DIAGNOSIS — Z6828 Body mass index (BMI) 28.0-28.9, adult: Secondary | ICD-10-CM | POA: Diagnosis not present

## 2018-11-05 DIAGNOSIS — K0889 Other specified disorders of teeth and supporting structures: Secondary | ICD-10-CM | POA: Diagnosis not present

## 2018-11-08 ENCOUNTER — Ambulatory Visit: Payer: Medicare Other | Admitting: Cardiovascular Disease

## 2018-12-02 DIAGNOSIS — E1122 Type 2 diabetes mellitus with diabetic chronic kidney disease: Secondary | ICD-10-CM | POA: Diagnosis not present

## 2018-12-02 DIAGNOSIS — N189 Chronic kidney disease, unspecified: Secondary | ICD-10-CM | POA: Diagnosis not present

## 2018-12-02 DIAGNOSIS — I10 Essential (primary) hypertension: Secondary | ICD-10-CM | POA: Diagnosis not present

## 2018-12-02 DIAGNOSIS — E7800 Pure hypercholesterolemia, unspecified: Secondary | ICD-10-CM | POA: Diagnosis not present

## 2018-12-02 DIAGNOSIS — E782 Mixed hyperlipidemia: Secondary | ICD-10-CM | POA: Diagnosis not present

## 2018-12-02 DIAGNOSIS — E1142 Type 2 diabetes mellitus with diabetic polyneuropathy: Secondary | ICD-10-CM | POA: Diagnosis not present

## 2018-12-02 DIAGNOSIS — E875 Hyperkalemia: Secondary | ICD-10-CM | POA: Diagnosis not present

## 2018-12-08 DIAGNOSIS — I10 Essential (primary) hypertension: Secondary | ICD-10-CM | POA: Diagnosis not present

## 2018-12-08 DIAGNOSIS — E782 Mixed hyperlipidemia: Secondary | ICD-10-CM | POA: Diagnosis not present

## 2018-12-08 DIAGNOSIS — Z1389 Encounter for screening for other disorder: Secondary | ICD-10-CM | POA: Diagnosis not present

## 2018-12-08 DIAGNOSIS — E1122 Type 2 diabetes mellitus with diabetic chronic kidney disease: Secondary | ICD-10-CM | POA: Diagnosis not present

## 2018-12-08 DIAGNOSIS — I4891 Unspecified atrial fibrillation: Secondary | ICD-10-CM | POA: Diagnosis not present

## 2018-12-08 DIAGNOSIS — Z6829 Body mass index (BMI) 29.0-29.9, adult: Secondary | ICD-10-CM | POA: Diagnosis not present

## 2018-12-08 DIAGNOSIS — E876 Hypokalemia: Secondary | ICD-10-CM | POA: Diagnosis not present

## 2018-12-11 DIAGNOSIS — E1142 Type 2 diabetes mellitus with diabetic polyneuropathy: Secondary | ICD-10-CM | POA: Diagnosis not present

## 2018-12-11 DIAGNOSIS — I10 Essential (primary) hypertension: Secondary | ICD-10-CM | POA: Diagnosis not present

## 2018-12-15 DIAGNOSIS — N6459 Other signs and symptoms in breast: Secondary | ICD-10-CM | POA: Diagnosis not present

## 2018-12-15 DIAGNOSIS — R922 Inconclusive mammogram: Secondary | ICD-10-CM | POA: Diagnosis not present

## 2018-12-15 DIAGNOSIS — N6489 Other specified disorders of breast: Secondary | ICD-10-CM | POA: Diagnosis not present

## 2018-12-15 DIAGNOSIS — N6311 Unspecified lump in the right breast, upper outer quadrant: Secondary | ICD-10-CM | POA: Diagnosis not present

## 2018-12-16 DIAGNOSIS — E113292 Type 2 diabetes mellitus with mild nonproliferative diabetic retinopathy without macular edema, left eye: Secondary | ICD-10-CM | POA: Diagnosis not present

## 2018-12-16 DIAGNOSIS — H40053 Ocular hypertension, bilateral: Secondary | ICD-10-CM | POA: Diagnosis not present

## 2018-12-16 DIAGNOSIS — H40023 Open angle with borderline findings, high risk, bilateral: Secondary | ICD-10-CM | POA: Diagnosis not present

## 2018-12-16 DIAGNOSIS — H35013 Changes in retinal vascular appearance, bilateral: Secondary | ICD-10-CM | POA: Diagnosis not present

## 2018-12-22 DIAGNOSIS — C50411 Malignant neoplasm of upper-outer quadrant of right female breast: Secondary | ICD-10-CM | POA: Diagnosis not present

## 2018-12-22 DIAGNOSIS — C50911 Malignant neoplasm of unspecified site of right female breast: Secondary | ICD-10-CM | POA: Diagnosis not present

## 2018-12-22 DIAGNOSIS — N6311 Unspecified lump in the right breast, upper outer quadrant: Secondary | ICD-10-CM | POA: Diagnosis not present

## 2018-12-24 ENCOUNTER — Telehealth: Payer: Self-pay | Admitting: Cardiovascular Disease

## 2018-12-24 DIAGNOSIS — C50911 Malignant neoplasm of unspecified site of right female breast: Secondary | ICD-10-CM | POA: Diagnosis not present

## 2018-12-24 NOTE — Telephone Encounter (Signed)
Patient will need to know when she can come of Eliquis.  She had a biopsy for breast cancer and it came back where she has to have more testing.

## 2018-12-24 NOTE — Telephone Encounter (Signed)
States that her breast biopsy came back positive for breast cancer so she will be having another procedure soon.  States her pmd is in the process of setting her up to see surgeon.  Explained to patient that she should not stop anyting currently.  When she has next surgery / procedure scheduled, she should have surgeon send cardiac clearance request to Dr. Bronson Ing.  Verbalized understanding.

## 2018-12-28 DIAGNOSIS — Z794 Long term (current) use of insulin: Secondary | ICD-10-CM | POA: Diagnosis not present

## 2018-12-28 DIAGNOSIS — E1122 Type 2 diabetes mellitus with diabetic chronic kidney disease: Secondary | ICD-10-CM | POA: Diagnosis not present

## 2018-12-28 DIAGNOSIS — I4811 Longstanding persistent atrial fibrillation: Secondary | ICD-10-CM | POA: Diagnosis not present

## 2018-12-28 DIAGNOSIS — Z170 Estrogen receptor positive status [ER+]: Secondary | ICD-10-CM | POA: Diagnosis not present

## 2018-12-28 DIAGNOSIS — C50411 Malignant neoplasm of upper-outer quadrant of right female breast: Secondary | ICD-10-CM | POA: Diagnosis not present

## 2019-01-11 DIAGNOSIS — C50911 Malignant neoplasm of unspecified site of right female breast: Secondary | ICD-10-CM | POA: Diagnosis not present

## 2019-01-11 DIAGNOSIS — I10 Essential (primary) hypertension: Secondary | ICD-10-CM | POA: Diagnosis not present

## 2019-01-12 DIAGNOSIS — C773 Secondary and unspecified malignant neoplasm of axilla and upper limb lymph nodes: Secondary | ICD-10-CM | POA: Diagnosis not present

## 2019-01-12 DIAGNOSIS — R590 Localized enlarged lymph nodes: Secondary | ICD-10-CM | POA: Diagnosis not present

## 2019-01-12 DIAGNOSIS — N6311 Unspecified lump in the right breast, upper outer quadrant: Secondary | ICD-10-CM | POA: Diagnosis not present

## 2019-01-12 DIAGNOSIS — C801 Malignant (primary) neoplasm, unspecified: Secondary | ICD-10-CM | POA: Diagnosis not present

## 2019-01-12 DIAGNOSIS — C50411 Malignant neoplasm of upper-outer quadrant of right female breast: Secondary | ICD-10-CM | POA: Diagnosis not present

## 2019-01-17 DIAGNOSIS — E1122 Type 2 diabetes mellitus with diabetic chronic kidney disease: Secondary | ICD-10-CM | POA: Diagnosis not present

## 2019-01-17 DIAGNOSIS — Z794 Long term (current) use of insulin: Secondary | ICD-10-CM | POA: Diagnosis not present

## 2019-01-17 DIAGNOSIS — C50411 Malignant neoplasm of upper-outer quadrant of right female breast: Secondary | ICD-10-CM | POA: Diagnosis not present

## 2019-01-17 DIAGNOSIS — Z170 Estrogen receptor positive status [ER+]: Secondary | ICD-10-CM | POA: Diagnosis not present

## 2019-01-17 DIAGNOSIS — R922 Inconclusive mammogram: Secondary | ICD-10-CM | POA: Diagnosis not present

## 2019-01-18 DIAGNOSIS — H2513 Age-related nuclear cataract, bilateral: Secondary | ICD-10-CM | POA: Diagnosis not present

## 2019-01-18 DIAGNOSIS — H401122 Primary open-angle glaucoma, left eye, moderate stage: Secondary | ICD-10-CM | POA: Diagnosis not present

## 2019-01-18 DIAGNOSIS — H25013 Cortical age-related cataract, bilateral: Secondary | ICD-10-CM | POA: Diagnosis not present

## 2019-01-18 DIAGNOSIS — H401111 Primary open-angle glaucoma, right eye, mild stage: Secondary | ICD-10-CM | POA: Diagnosis not present

## 2019-01-18 DIAGNOSIS — H2511 Age-related nuclear cataract, right eye: Secondary | ICD-10-CM | POA: Diagnosis not present

## 2019-01-19 ENCOUNTER — Telehealth: Payer: Self-pay | Admitting: Cardiovascular Disease

## 2019-01-19 DIAGNOSIS — Z170 Estrogen receptor positive status [ER+]: Secondary | ICD-10-CM | POA: Diagnosis not present

## 2019-01-19 DIAGNOSIS — C50411 Malignant neoplasm of upper-outer quadrant of right female breast: Secondary | ICD-10-CM | POA: Diagnosis not present

## 2019-01-19 NOTE — Telephone Encounter (Signed)
Virtual Visit Pre-Appointment Phone Call  "(Name), I am calling you today to discuss your upcoming appointment. We are currently trying to limit exposure to the virus that causes COVID-19 by seeing patients at home rather than in the office."  1. "What is the BEST phone number to call the day of the visit?    [phone]  2. Do you have or have access to (through a family member/friend) a smartphone with video capability that we can use for your visit?" a. If yes - list this number in appt notes as cell (if different from BEST phone #) and list the appointment type as a VIDEO visit in appointment notes b. If no - list the appointment type as a PHONE visit in appointment notes  3. Confirm consent - "In the setting of the current Covid19 crisis, you are scheduled for a (phone or video) visit with your provider on (date) at (time).  Just as we do with many in-office visits, in order for you to participate in this visit, we must obtain consent.  If you'd like, I can send this to your mychart (if signed up) or email for you to review.  Otherwise, I can obtain your verbal consent now.  All virtual visits are billed to your insurance company just like a normal visit would be.  By agreeing to a virtual visit, we'd like you to understand that the technology does not allow for your provider to perform an examination, and thus may limit your provider's ability to fully assess your condition. If your provider identifies any concerns that need to be evaluated in person, we will make arrangements to do so.  Finally, though the technology is pretty good, we cannot assure that it will always work on either your or our end, and in the setting of a video visit, we may have to convert it to a phone-only visit.  In either situation, we cannot ensure that we have a secure connection.  Are you willing to proceed?" STAFF: Did the patient verbally acknowledge consent to telehealth visit? Document YES/NO here:  YES    4. Advise patient to be prepared - "Two hours prior to your appointment, go ahead and check your blood pressure, pulse, oxygen saturation, and your weight (if you have the equipment to check those) and write them all down. When your visit starts, your provider will ask you for this information. If you have an Apple Watch or Kardia device, please plan to have heart rate information ready on the day of your appointment. Please have a pen and paper handy nearby the day of the visit as well."  5. Give patient instructions for MyChart download to smartphone OR Doximity/Doxy.me as below if video visit (depending on what platform provider is using)  6. Inform patient they will receive a phone call 15 minutes prior to their appointment time (may be from unknown caller ID) so they should be prepared to answer    TELEPHONE CALL NOTE  Betty Goodman has been deemed a candidate for a follow-up tele-health visit to limit community exposure during the Covid-19 pandemic. I spoke with the patient via phone to ensure availability of phone/video source, confirm preferred email & phone number, and discuss instructions and expectations.  I reminded Betty Goodman to be prepared with any vital sign and/or heart rhythm information that could potentially be obtained via home monitoring, at the time of her visit. I reminded Betty Goodman to expect a phone call prior to  her visit.  Chanda Busing 01/19/2019 2:59 PM   INSTRUCTIONS FOR DOWNLOADING THE MYCHART APP TO SMARTPHONE  - The patient must first make sure to have activated MyChart and know their login information - If Apple, go to [employer] and type in MyChart in the search bar and download the app. If Android, ask patient to go to [employer] and type in [place] in the search bar and download the app. The app is free but as with any other app downloads, their phone may require them to verify saved payment information or Apple/Android password.  - The  patient will need to then log into the app with their MyChart username and password, and select [facility] as their healthcare provider to link the account. When it is time for your visit, go to the MyChart app, find appointments, and click Begin Video Visit. Be sure to Select Allow for your device to access the Microphone and Camera for your visit. You will then be connected, and your provider will be with you shortly.  **If they have any issues connecting, or need assistance please contact MyChart service desk [phone])**  **If using a computer, in order to ensure the best quality for their visit they will need to use either of the following Internet Browsers: [employer], or Google Chrome**  IF USING DOXIMITY or DOXY.ME - The patient will receive a link just prior to their visit by text.     FULL LENGTH CONSENT FOR TELE-HEALTH VISIT   I hereby voluntarily request, consent and authorize [facility] and its employed or contracted physicians, physician assistants, nurse practitioners or other licensed health care professionals (the Practitioner), to provide me with telemedicine health care services (the Services") as deemed necessary by the treating Practitioner. I acknowledge and consent to receive the Services by the Practitioner via telemedicine. I understand that the telemedicine visit will involve communicating with the Practitioner through live audiovisual communication technology and the disclosure of certain medical information by electronic transmission. I acknowledge that I have been given the opportunity to request an in-person assessment or other available alternative prior to the telemedicine visit and am voluntarily participating in the telemedicine visit.  I understand that I have the right to withhold or withdraw my consent to the use of telemedicine in the course of my care at any time, without affecting my right to future care or treatment, and that the  Practitioner or I may terminate the telemedicine visit at any time. I understand that I have the right to inspect all information obtained and/or recorded in the course of the telemedicine visit and may receive copies of available information for a reasonable fee.  I understand that some of the potential risks of receiving the Services via telemedicine include:   Delay or interruption in medical evaluation due to technological equipment failure or disruption;  Information transmitted may not be sufficient (e.g. poor resolution of images) to allow for appropriate medical decision making by the Practitioner; and/or   In rare instances, security protocols could fail, causing a breach of personal health information.  Furthermore, I acknowledge that it is my responsibility to provide information about my medical history, conditions and care that is complete and accurate to the best of my ability. I acknowledge that Practitioner's advice, recommendations, and/or decision may be based on factors not within their control, such as incomplete or inaccurate data provided by me or distortions of diagnostic images or specimens that may result from electronic transmissions. I  understand that the practice of medicine is not an exact science and that Practitioner makes no warranties or guarantees regarding treatment outcomes. I acknowledge that I will receive a copy of this consent concurrently upon execution via email to the email address I last provided but may also request a printed copy by calling the office of [facility].    I understand that my insurance will be billed for this visit.   I have read or had this consent read to me.  I understand the contents of this consent, which adequately explains the benefits and risks of the Services being provided via telemedicine.   I have been provided ample opportunity to ask questions regarding this consent and the Services and have had my questions answered to my  satisfaction.  I give my informed consent for the services to be provided through the use of telemedicine in my medical care  By participating in this telemedicine visit I agree to the above.

## 2019-01-24 ENCOUNTER — Encounter: Payer: Self-pay | Admitting: *Deleted

## 2019-01-25 ENCOUNTER — Encounter: Payer: Self-pay | Admitting: Cardiovascular Disease

## 2019-01-25 ENCOUNTER — Telehealth (INDEPENDENT_AMBULATORY_CARE_PROVIDER_SITE_OTHER): Payer: Medicare Other | Admitting: Cardiovascular Disease

## 2019-01-25 VITALS — BP 113/73 | HR 82 | Ht 61.0 in | Wt 149.0 lb

## 2019-01-25 DIAGNOSIS — C50911 Malignant neoplasm of unspecified site of right female breast: Secondary | ICD-10-CM

## 2019-01-25 DIAGNOSIS — I10 Essential (primary) hypertension: Secondary | ICD-10-CM

## 2019-01-25 DIAGNOSIS — Z170 Estrogen receptor positive status [ER+]: Secondary | ICD-10-CM | POA: Diagnosis not present

## 2019-01-25 DIAGNOSIS — R590 Localized enlarged lymph nodes: Secondary | ICD-10-CM | POA: Diagnosis not present

## 2019-01-25 DIAGNOSIS — I700 Atherosclerosis of aorta: Secondary | ICD-10-CM | POA: Diagnosis not present

## 2019-01-25 DIAGNOSIS — I4819 Other persistent atrial fibrillation: Secondary | ICD-10-CM

## 2019-01-25 DIAGNOSIS — N200 Calculus of kidney: Secondary | ICD-10-CM | POA: Diagnosis not present

## 2019-01-25 DIAGNOSIS — C50411 Malignant neoplasm of upper-outer quadrant of right female breast: Secondary | ICD-10-CM | POA: Diagnosis not present

## 2019-01-25 DIAGNOSIS — E7800 Pure hypercholesterolemia, unspecified: Secondary | ICD-10-CM

## 2019-01-25 DIAGNOSIS — D259 Leiomyoma of uterus, unspecified: Secondary | ICD-10-CM | POA: Diagnosis not present

## 2019-01-25 NOTE — Patient Instructions (Signed)

## 2019-01-25 NOTE — Progress Notes (Signed)
Virtual Visit via Telephone Note   This visit type was conducted due to national recommendations for restrictions regarding the COVID-19 Pandemic (e.g. social distancing) in an effort to limit this patient's exposure and mitigate transmission in our community.  Due to her co-morbid illnesses, this patient is at least at moderate risk for complications without adequate follow up.  This format is felt to be most appropriate for this patient at this time.  The patient did not have access to video technology/had technical difficulties with video requiring transitioning to audio format only (telephone).  All issues noted in this document were discussed and addressed.  No physical exam could be performed with this format.  Please refer to the patient's chart for her  consent to telehealth for [facility].   Date:  01/25/2019   ID:  Betty Goodman, DOB 08/05/1945, MRN [id number]  Patient Location: Home Provider Location: Office  PCP:  Manon Hilding, MD  Cardiologist:  Kate Sable, MD  Electrophysiologist:  None   Evaluation Performed:  Follow-Up Visit  Chief Complaint: Atrial fibrillation  History of Present Illness:    Betty Goodman is a 73 y.o. female with persistent atrial fibrillation and hypertension.  She denies chest pain, palpitations, and shortness of breath.  She was recently diagnosed with stage II right-sided breast cancer and is scheduled to undergo surgery for this on February 03, 2019.  She follows with oncology at [facility].  She is in very good spirits and has a strong faith.  The patient does not have symptoms concerning for COVID-19 infection (fever, chills, cough, or new shortness of breath).   Social history: She is originally from [place], [place].  She is widowed.  Her husband passed away in October 13, 2017.  She has an adult daughter, Tilda Burrow, who lives in the [place] area. She has another daughter in [place] in the [place] area. She has 2 sons in [place]. She raised herself  since the age of 15 initially in [place], [place].   Past Medical History:  Diagnosis Date  . Atrial fibrillation ([place])   . Kidney stone    resulting in stenting  . Type 2 diabetes mellitus ([place])   . Unspecified essential hypertension    Past Surgical History:  Procedure Laterality Date  . ADENOIDECTOMY    . TONSILLECTOMY    . TUBAL LIGATION       Current Meds  Medication Sig  . alendronate (FOSAMAX) 70 MG tablet Take 70 mg by mouth once a week. Take with a full glass of water on an empty stomach.  Marland Kitchen amLODipine (NORVASC) 10 MG tablet Take 10 mg by mouth daily.  Marland Kitchen apixaban (ELIQUIS) 5 MG TABS tablet Take 1 tablet (5 mg total) by mouth 2 (two) times daily.  Marland Kitchen atorvastatin (LIPITOR) 40 MG tablet Take 40 mg by mouth daily.  . carvedilol (COREG CR) 20 MG 24 hr capsule Take 20 mg by mouth daily.  . cloNIDine (CATAPRES) 0.1 MG tablet Take 0.2 mg by mouth 3 (three) times daily.   Marland Kitchen diltiazem (CARDIZEM) 60 MG tablet Take 1 tablet (60 mg total) by mouth 2 (two) times daily.  . fluticasone (FLONASE) 50 MCG/ACT nasal spray Place 2 sprays into the nose daily.  Marland Kitchen glipiZIDE (GLUCOTROL) 5 MG tablet Take 5 mg by mouth daily before breakfast.  . Liraglutide (VICTOZA [place]) Inject 1.8 mg into the skin daily.   Marland Kitchen lisinopril-hydrochlorothiazide (PRINZIDE,ZESTORETIC) 20-12.5 MG per tablet Take 1 tablet by mouth 2 (two) times daily.  Marland Kitchen  loratadine (CLARITIN) 10 MG tablet Take 10 mg by mouth daily.  . metFORMIN (GLUCOPHAGE) 1000 MG tablet Take 1,000 mg by mouth 2 (two) times daily with a meal.     Allergies:   Sulfa antibiotics   Social History   Tobacco Use  . Smoking status: Never Smoker  . Smokeless tobacco: Never Used  Substance Use Topics  . Alcohol use: No  . Drug use: Not on file     Family Hx: The patient's family history includes Cancer in her mother; Leukemia in her sister.  ROS:   Please see the history of present illness.     All other systems reviewed and are negative.   Prior  CV studies:   The following studies were reviewed today:  Echocardiogram 08/12/2018:   1. The left ventricle has normal systolic function of 61-44%. The cavity size is normal. There is no left ventricular wall thickness. Indeterminate diastolic filling patterns. The left ventricular diastology could not be evaluated secondary to atrial  fibrillation.  2. The mitral valve normal in structure. Regurgitation is mild by color flow Doppler.  3. Normal tricuspid valve.  4. Tricuspid regurgitation moderate. RVSP 32.5 mmHg.  5. The aortic root is normal is size and structure.  6. The aortic valve is tricuspid. There is mild calcification of the aortic valve.  7. Moderately dilated left atrial size.  8. Normal right atrial size.  9. No atrial level shunt detected by color flow Doppler.  Labs/Other Tests and Data Reviewed:    EKG:  No ECG reviewed.  Recent Labs: No results found for requested labs within last 8760 hours.   Recent Lipid Panel No results found for: CHOL, TRIG, HDL, CHOLHDL, LDLCALC, LDLDIRECT  Wt Readings from Last 3 Encounters:  01/25/19 149 lb (67.6 kg)  08/02/18 156 lb (70.8 kg)  09/19/13 170 lb (77.1 kg)     Objective:    Vital Signs:  BP 113/73   Pulse 82   Ht 5\' 1"  (1.549 m)   Wt 149 lb (67.6 kg)   BMI 28.15 kg/m    Repeat vitals: 123/81, HR 71  VITAL SIGNS:  reviewed  ASSESSMENT & PLAN:    1.  Persistent atrial fibrillation: Symptomatically stable.  Left atrium moderately dilated by echocardiogram on 08/12/2018.  Heart rate controlled on carvedilol and diltiazem.  Anticoagulated with apixaban 5 mg twice daily.  I would recommend discontinuing amlodipine so that she will not be on two calcium channel blockers.  2.  Hypertension: Blood pressure is controlled.  No changes to therapy.  I would recommend discontinuing amlodipine so that she will not be on two calcium channel blockers.  3.  Hyperlipidemia: Continue atorvastatin.  4. Stage 2 right breast CA:  Scheduled for surgery on 02/03/2019.  She will likely be receiving radiation therapy.    COVID-19 Education: The signs and symptoms of COVID-19 were discussed with the patient and how to seek care for testing (follow up with PCP or arrange E-visit).  The importance of social distancing was discussed today.  Time:   Today, I have spent 15 minutes with the patient with telehealth technology discussing the above problems.     Medication Adjustments/Labs and Tests Ordered: Current medicines are reviewed at length with the patient today.  Concerns regarding medicines are outlined above.   Tests Ordered: No orders of the defined types were placed in this encounter.   Medication Changes: No orders of the defined types were placed in this encounter.   Follow Up:  Virtual Visit or In Person in 6 month(s)  Signed, Kate Sable, MD  01/25/2019 9:45 AM    [place]

## 2019-01-27 ENCOUNTER — Telehealth: Payer: Medicare Other | Admitting: Cardiovascular Disease

## 2019-02-01 DIAGNOSIS — C50411 Malignant neoplasm of upper-outer quadrant of right female breast: Secondary | ICD-10-CM | POA: Diagnosis not present

## 2019-02-01 DIAGNOSIS — Z1159 Encounter for screening for other viral diseases: Secondary | ICD-10-CM | POA: Diagnosis not present

## 2019-02-01 DIAGNOSIS — Z170 Estrogen receptor positive status [ER+]: Secondary | ICD-10-CM | POA: Diagnosis not present

## 2019-02-01 DIAGNOSIS — C50412 Malignant neoplasm of upper-outer quadrant of left female breast: Secondary | ICD-10-CM | POA: Diagnosis not present

## 2019-02-03 DIAGNOSIS — Z7901 Long term (current) use of anticoagulants: Secondary | ICD-10-CM | POA: Diagnosis not present

## 2019-02-03 DIAGNOSIS — F17210 Nicotine dependence, cigarettes, uncomplicated: Secondary | ICD-10-CM | POA: Diagnosis not present

## 2019-02-03 DIAGNOSIS — I4891 Unspecified atrial fibrillation: Secondary | ICD-10-CM | POA: Diagnosis not present

## 2019-02-03 DIAGNOSIS — Z79899 Other long term (current) drug therapy: Secondary | ICD-10-CM | POA: Diagnosis not present

## 2019-02-03 DIAGNOSIS — Z170 Estrogen receptor positive status [ER+]: Secondary | ICD-10-CM | POA: Diagnosis not present

## 2019-02-03 DIAGNOSIS — E1122 Type 2 diabetes mellitus with diabetic chronic kidney disease: Secondary | ICD-10-CM | POA: Diagnosis not present

## 2019-02-03 DIAGNOSIS — I129 Hypertensive chronic kidney disease with stage 1 through stage 4 chronic kidney disease, or unspecified chronic kidney disease: Secondary | ICD-10-CM | POA: Diagnosis not present

## 2019-02-03 DIAGNOSIS — C773 Secondary and unspecified malignant neoplasm of axilla and upper limb lymph nodes: Secondary | ICD-10-CM | POA: Diagnosis not present

## 2019-02-03 DIAGNOSIS — Z6828 Body mass index (BMI) 28.0-28.9, adult: Secondary | ICD-10-CM | POA: Diagnosis not present

## 2019-02-03 DIAGNOSIS — E785 Hyperlipidemia, unspecified: Secondary | ICD-10-CM | POA: Diagnosis not present

## 2019-02-03 DIAGNOSIS — N189 Chronic kidney disease, unspecified: Secondary | ICD-10-CM | POA: Diagnosis not present

## 2019-02-03 DIAGNOSIS — M8580 Other specified disorders of bone density and structure, unspecified site: Secondary | ICD-10-CM | POA: Diagnosis not present

## 2019-02-03 DIAGNOSIS — Z7984 Long term (current) use of oral hypoglycemic drugs: Secondary | ICD-10-CM | POA: Diagnosis not present

## 2019-02-03 DIAGNOSIS — C50411 Malignant neoplasm of upper-outer quadrant of right female breast: Secondary | ICD-10-CM | POA: Diagnosis not present

## 2019-02-03 DIAGNOSIS — C50911 Malignant neoplasm of unspecified site of right female breast: Secondary | ICD-10-CM | POA: Diagnosis not present

## 2019-02-03 DIAGNOSIS — Z882 Allergy status to sulfonamides status: Secondary | ICD-10-CM | POA: Diagnosis not present

## 2019-02-10 DIAGNOSIS — Z09 Encounter for follow-up examination after completed treatment for conditions other than malignant neoplasm: Secondary | ICD-10-CM | POA: Diagnosis not present

## 2019-02-10 DIAGNOSIS — C50411 Malignant neoplasm of upper-outer quadrant of right female breast: Secondary | ICD-10-CM | POA: Diagnosis not present

## 2019-02-10 DIAGNOSIS — Z170 Estrogen receptor positive status [ER+]: Secondary | ICD-10-CM | POA: Diagnosis not present

## 2019-02-14 DIAGNOSIS — Z170 Estrogen receptor positive status [ER+]: Secondary | ICD-10-CM | POA: Diagnosis not present

## 2019-02-14 DIAGNOSIS — C50411 Malignant neoplasm of upper-outer quadrant of right female breast: Secondary | ICD-10-CM | POA: Diagnosis not present

## 2019-02-14 DIAGNOSIS — Z09 Encounter for follow-up examination after completed treatment for conditions other than malignant neoplasm: Secondary | ICD-10-CM | POA: Diagnosis not present

## 2019-02-17 DIAGNOSIS — C50411 Malignant neoplasm of upper-outer quadrant of right female breast: Secondary | ICD-10-CM | POA: Diagnosis not present

## 2019-02-17 DIAGNOSIS — Z170 Estrogen receptor positive status [ER+]: Secondary | ICD-10-CM | POA: Diagnosis not present

## 2019-02-17 DIAGNOSIS — I4811 Longstanding persistent atrial fibrillation: Secondary | ICD-10-CM | POA: Diagnosis not present

## 2019-02-28 DIAGNOSIS — C50411 Malignant neoplasm of upper-outer quadrant of right female breast: Secondary | ICD-10-CM | POA: Diagnosis not present

## 2019-02-28 DIAGNOSIS — Z170 Estrogen receptor positive status [ER+]: Secondary | ICD-10-CM | POA: Diagnosis not present

## 2019-03-01 DIAGNOSIS — E1122 Type 2 diabetes mellitus with diabetic chronic kidney disease: Secondary | ICD-10-CM | POA: Diagnosis not present

## 2019-03-01 DIAGNOSIS — Z882 Allergy status to sulfonamides status: Secondary | ICD-10-CM | POA: Diagnosis not present

## 2019-03-01 DIAGNOSIS — I129 Hypertensive chronic kidney disease with stage 1 through stage 4 chronic kidney disease, or unspecified chronic kidney disease: Secondary | ICD-10-CM | POA: Diagnosis not present

## 2019-03-01 DIAGNOSIS — Z79899 Other long term (current) drug therapy: Secondary | ICD-10-CM | POA: Diagnosis not present

## 2019-03-01 DIAGNOSIS — Z7901 Long term (current) use of anticoagulants: Secondary | ICD-10-CM | POA: Diagnosis not present

## 2019-03-01 DIAGNOSIS — Z7983 Long term (current) use of bisphosphonates: Secondary | ICD-10-CM | POA: Diagnosis not present

## 2019-03-01 DIAGNOSIS — C773 Secondary and unspecified malignant neoplasm of axilla and upper limb lymph nodes: Secondary | ICD-10-CM | POA: Diagnosis not present

## 2019-03-01 DIAGNOSIS — N99840 Postprocedural hematoma of a genitourinary system organ or structure following a genitourinary system procedure: Secondary | ICD-10-CM | POA: Diagnosis not present

## 2019-03-01 DIAGNOSIS — Z170 Estrogen receptor positive status [ER+]: Secondary | ICD-10-CM | POA: Diagnosis not present

## 2019-03-01 DIAGNOSIS — Z7984 Long term (current) use of oral hypoglycemic drugs: Secondary | ICD-10-CM | POA: Diagnosis not present

## 2019-03-01 DIAGNOSIS — F17200 Nicotine dependence, unspecified, uncomplicated: Secondary | ICD-10-CM | POA: Diagnosis not present

## 2019-03-01 DIAGNOSIS — C50411 Malignant neoplasm of upper-outer quadrant of right female breast: Secondary | ICD-10-CM | POA: Diagnosis not present

## 2019-03-01 DIAGNOSIS — N6489 Other specified disorders of breast: Secondary | ICD-10-CM | POA: Diagnosis not present

## 2019-03-01 DIAGNOSIS — Z806 Family history of leukemia: Secondary | ICD-10-CM | POA: Diagnosis not present

## 2019-03-01 DIAGNOSIS — N189 Chronic kidney disease, unspecified: Secondary | ICD-10-CM | POA: Diagnosis not present

## 2019-03-01 DIAGNOSIS — E785 Hyperlipidemia, unspecified: Secondary | ICD-10-CM | POA: Diagnosis not present

## 2019-03-01 DIAGNOSIS — I4891 Unspecified atrial fibrillation: Secondary | ICD-10-CM | POA: Diagnosis not present

## 2019-03-01 DIAGNOSIS — Z9889 Other specified postprocedural states: Secondary | ICD-10-CM | POA: Diagnosis not present

## 2019-03-02 DIAGNOSIS — E7800 Pure hypercholesterolemia, unspecified: Secondary | ICD-10-CM | POA: Diagnosis not present

## 2019-03-02 DIAGNOSIS — E1142 Type 2 diabetes mellitus with diabetic polyneuropathy: Secondary | ICD-10-CM | POA: Diagnosis not present

## 2019-03-02 DIAGNOSIS — I10 Essential (primary) hypertension: Secondary | ICD-10-CM | POA: Diagnosis not present

## 2019-03-02 DIAGNOSIS — N189 Chronic kidney disease, unspecified: Secondary | ICD-10-CM | POA: Diagnosis not present

## 2019-03-02 DIAGNOSIS — E1122 Type 2 diabetes mellitus with diabetic chronic kidney disease: Secondary | ICD-10-CM | POA: Diagnosis not present

## 2019-03-02 DIAGNOSIS — E782 Mixed hyperlipidemia: Secondary | ICD-10-CM | POA: Diagnosis not present

## 2019-03-03 ENCOUNTER — Ambulatory Visit (INDEPENDENT_AMBULATORY_CARE_PROVIDER_SITE_OTHER): Payer: Medicare Other | Admitting: *Deleted

## 2019-03-03 NOTE — Progress Notes (Signed)
Pt was started on Eliquis 5mg  for Afib by Dr. Bronson Ing on 08/02/2018.    Reviewed patients medication list.  Pt is not currently on any combined P-gp and strong CYP3A4 inhibitors/inducers (ketoconazole, traconazole, ritonavir, carbamazepine, phenytoin, rifampin, St. John's wort).  Reviewed labs from [facility] PCP office and SCr-0.99 on 03/02/2019, Weight-70.8kg.  Dose  Appropriate based on age, weight, and SCr. Hgb-14.9 and HCT-45.6 from [facility] on 12/28/2018.    A full discussion of the nature of anticoagulants has been carried out. A benefit/risk analysis has been presented to the patient, so that they understand the justification for choosing anticoagulation with Eliquis at this time. The need for compliance is stressed.  Pt is aware to take the medication twice daily. Side effects of potential bleeding are discussed, including unusual colored urine or stools, coughing up blood or coffee ground emesis, nose bleeds or serious fall or head trauma. Discussed signs and symptoms of stroke. The patient should avoid any OTC items containing aspirin or ibuprofen.  Avoid alcohol consumption. Call if any signs of abnormal bleeding. Discussed financial obligations and resolved any difficulty in obtaining medication.    Pt states she had breast surgery 2 weeks ago and had to hold her Eliquis for the procedure. Also, she states she had labs drawn at her PCP office on yesterday. Patient states she will be undergoing radiation in the near future and will have cataract surgery as well. Called [facility] and requested recent labs and they were faxed over and used for today's visit. Pt is aware to remain on Eliquis 5mg  twice a day and to call with any issues. Pt is aware labs will need to be done annually at any of her Physician offices and she verbalized understanding. She states she will have an appt with Kassie Mends on 03/08/2019 and let her know that as well.

## 2019-03-11 DIAGNOSIS — C50911 Malignant neoplasm of unspecified site of right female breast: Secondary | ICD-10-CM | POA: Diagnosis not present

## 2019-03-11 DIAGNOSIS — I4891 Unspecified atrial fibrillation: Secondary | ICD-10-CM | POA: Diagnosis not present

## 2019-03-11 DIAGNOSIS — I10 Essential (primary) hypertension: Secondary | ICD-10-CM | POA: Diagnosis not present

## 2019-03-11 DIAGNOSIS — Z6828 Body mass index (BMI) 28.0-28.9, adult: Secondary | ICD-10-CM | POA: Diagnosis not present

## 2019-03-11 DIAGNOSIS — E782 Mixed hyperlipidemia: Secondary | ICD-10-CM | POA: Diagnosis not present

## 2019-03-11 DIAGNOSIS — E1122 Type 2 diabetes mellitus with diabetic chronic kidney disease: Secondary | ICD-10-CM | POA: Diagnosis not present

## 2019-03-11 DIAGNOSIS — E876 Hypokalemia: Secondary | ICD-10-CM | POA: Diagnosis not present

## 2019-03-14 DIAGNOSIS — I10 Essential (primary) hypertension: Secondary | ICD-10-CM | POA: Diagnosis not present

## 2019-03-14 DIAGNOSIS — E785 Hyperlipidemia, unspecified: Secondary | ICD-10-CM | POA: Diagnosis not present

## 2019-03-15 DIAGNOSIS — Z170 Estrogen receptor positive status [ER+]: Secondary | ICD-10-CM | POA: Diagnosis not present

## 2019-03-15 DIAGNOSIS — C50411 Malignant neoplasm of upper-outer quadrant of right female breast: Secondary | ICD-10-CM | POA: Diagnosis not present

## 2019-03-22 ENCOUNTER — Telehealth: Payer: Self-pay | Admitting: Cardiovascular Disease

## 2019-03-22 NOTE — Telephone Encounter (Signed)
Nothing at the present time.  Depending upon the type of chemotherapy, cardiac function sometimes monitored by echocardiography.  This would typically be ordered by her oncologist if the chemotherapeutic agents have the potential for cardiotoxicity.

## 2019-03-22 NOTE — Telephone Encounter (Signed)
Patient notified and verbalized understanding. 

## 2019-03-22 NOTE — Telephone Encounter (Signed)
Left message to return call 

## 2019-03-22 NOTE — Telephone Encounter (Signed)
Patient has questions about what her Cancer doctor is wanting to do. Needs to know if she needs to be seen before has done

## 2019-03-22 NOTE — Telephone Encounter (Signed)
Is going to have to have chemo every 21 days for 3 months & radiation daily (not sure how long on this).  Has not had any cardiac symptoms since last telehealth visit.  Wants to know if anything needs to be done cardiac wise.

## 2019-03-30 DIAGNOSIS — H40051 Ocular hypertension, right eye: Secondary | ICD-10-CM | POA: Diagnosis not present

## 2019-03-30 DIAGNOSIS — H401111 Primary open-angle glaucoma, right eye, mild stage: Secondary | ICD-10-CM | POA: Diagnosis not present

## 2019-03-30 DIAGNOSIS — H25811 Combined forms of age-related cataract, right eye: Secondary | ICD-10-CM | POA: Diagnosis not present

## 2019-03-30 DIAGNOSIS — H2511 Age-related nuclear cataract, right eye: Secondary | ICD-10-CM | POA: Diagnosis not present

## 2019-03-31 DIAGNOSIS — I4811 Longstanding persistent atrial fibrillation: Secondary | ICD-10-CM | POA: Diagnosis not present

## 2019-03-31 DIAGNOSIS — Z170 Estrogen receptor positive status [ER+]: Secondary | ICD-10-CM | POA: Diagnosis not present

## 2019-03-31 DIAGNOSIS — C50411 Malignant neoplasm of upper-outer quadrant of right female breast: Secondary | ICD-10-CM | POA: Diagnosis not present

## 2019-03-31 DIAGNOSIS — Z794 Long term (current) use of insulin: Secondary | ICD-10-CM | POA: Diagnosis not present

## 2019-03-31 DIAGNOSIS — E1122 Type 2 diabetes mellitus with diabetic chronic kidney disease: Secondary | ICD-10-CM | POA: Diagnosis not present

## 2019-04-13 DIAGNOSIS — Z170 Estrogen receptor positive status [ER+]: Secondary | ICD-10-CM | POA: Diagnosis not present

## 2019-04-13 DIAGNOSIS — E1122 Type 2 diabetes mellitus with diabetic chronic kidney disease: Secondary | ICD-10-CM | POA: Diagnosis not present

## 2019-04-13 DIAGNOSIS — C50411 Malignant neoplasm of upper-outer quadrant of right female breast: Secondary | ICD-10-CM | POA: Diagnosis not present

## 2019-04-13 DIAGNOSIS — I10 Essential (primary) hypertension: Secondary | ICD-10-CM | POA: Diagnosis not present

## 2019-04-14 DIAGNOSIS — Z170 Estrogen receptor positive status [ER+]: Secondary | ICD-10-CM | POA: Diagnosis not present

## 2019-04-14 DIAGNOSIS — C50411 Malignant neoplasm of upper-outer quadrant of right female breast: Secondary | ICD-10-CM | POA: Diagnosis not present

## 2019-04-15 DIAGNOSIS — Z170 Estrogen receptor positive status [ER+]: Secondary | ICD-10-CM | POA: Diagnosis not present

## 2019-04-15 DIAGNOSIS — Z09 Encounter for follow-up examination after completed treatment for conditions other than malignant neoplasm: Secondary | ICD-10-CM | POA: Diagnosis not present

## 2019-04-15 DIAGNOSIS — C50411 Malignant neoplasm of upper-outer quadrant of right female breast: Secondary | ICD-10-CM | POA: Diagnosis not present

## 2019-04-20 DIAGNOSIS — Z4801 Encounter for change or removal of surgical wound dressing: Secondary | ICD-10-CM | POA: Diagnosis not present

## 2019-04-20 DIAGNOSIS — T8189XD Other complications of procedures, not elsewhere classified, subsequent encounter: Secondary | ICD-10-CM | POA: Diagnosis not present

## 2019-04-20 DIAGNOSIS — E119 Type 2 diabetes mellitus without complications: Secondary | ICD-10-CM | POA: Diagnosis not present

## 2019-04-20 DIAGNOSIS — Z170 Estrogen receptor positive status [ER+]: Secondary | ICD-10-CM | POA: Diagnosis not present

## 2019-04-20 DIAGNOSIS — J309 Allergic rhinitis, unspecified: Secondary | ICD-10-CM | POA: Diagnosis not present

## 2019-04-20 DIAGNOSIS — Z7901 Long term (current) use of anticoagulants: Secondary | ICD-10-CM | POA: Diagnosis not present

## 2019-04-20 DIAGNOSIS — Z7984 Long term (current) use of oral hypoglycemic drugs: Secondary | ICD-10-CM | POA: Diagnosis not present

## 2019-04-20 DIAGNOSIS — I10 Essential (primary) hypertension: Secondary | ICD-10-CM | POA: Diagnosis not present

## 2019-04-20 DIAGNOSIS — C50411 Malignant neoplasm of upper-outer quadrant of right female breast: Secondary | ICD-10-CM | POA: Diagnosis not present

## 2019-04-21 DIAGNOSIS — I10 Essential (primary) hypertension: Secondary | ICD-10-CM | POA: Diagnosis not present

## 2019-04-21 DIAGNOSIS — Z4801 Encounter for change or removal of surgical wound dressing: Secondary | ICD-10-CM | POA: Diagnosis not present

## 2019-04-21 DIAGNOSIS — T8189XD Other complications of procedures, not elsewhere classified, subsequent encounter: Secondary | ICD-10-CM | POA: Diagnosis not present

## 2019-04-21 DIAGNOSIS — C50411 Malignant neoplasm of upper-outer quadrant of right female breast: Secondary | ICD-10-CM | POA: Diagnosis not present

## 2019-04-21 DIAGNOSIS — Z170 Estrogen receptor positive status [ER+]: Secondary | ICD-10-CM | POA: Diagnosis not present

## 2019-04-21 DIAGNOSIS — E119 Type 2 diabetes mellitus without complications: Secondary | ICD-10-CM | POA: Diagnosis not present

## 2019-04-22 DIAGNOSIS — E119 Type 2 diabetes mellitus without complications: Secondary | ICD-10-CM | POA: Diagnosis not present

## 2019-04-22 DIAGNOSIS — T8189XD Other complications of procedures, not elsewhere classified, subsequent encounter: Secondary | ICD-10-CM | POA: Diagnosis not present

## 2019-04-22 DIAGNOSIS — Z170 Estrogen receptor positive status [ER+]: Secondary | ICD-10-CM | POA: Diagnosis not present

## 2019-04-22 DIAGNOSIS — Z4801 Encounter for change or removal of surgical wound dressing: Secondary | ICD-10-CM | POA: Diagnosis not present

## 2019-04-22 DIAGNOSIS — I10 Essential (primary) hypertension: Secondary | ICD-10-CM | POA: Diagnosis not present

## 2019-04-22 DIAGNOSIS — C50411 Malignant neoplasm of upper-outer quadrant of right female breast: Secondary | ICD-10-CM | POA: Diagnosis not present

## 2019-04-25 DIAGNOSIS — Z170 Estrogen receptor positive status [ER+]: Secondary | ICD-10-CM | POA: Diagnosis not present

## 2019-04-25 DIAGNOSIS — C50411 Malignant neoplasm of upper-outer quadrant of right female breast: Secondary | ICD-10-CM | POA: Diagnosis not present

## 2019-04-25 DIAGNOSIS — Z09 Encounter for follow-up examination after completed treatment for conditions other than malignant neoplasm: Secondary | ICD-10-CM | POA: Diagnosis not present

## 2019-04-27 DIAGNOSIS — C50411 Malignant neoplasm of upper-outer quadrant of right female breast: Secondary | ICD-10-CM | POA: Diagnosis not present

## 2019-04-27 DIAGNOSIS — I10 Essential (primary) hypertension: Secondary | ICD-10-CM | POA: Diagnosis not present

## 2019-04-27 DIAGNOSIS — E119 Type 2 diabetes mellitus without complications: Secondary | ICD-10-CM | POA: Diagnosis not present

## 2019-04-27 DIAGNOSIS — Z170 Estrogen receptor positive status [ER+]: Secondary | ICD-10-CM | POA: Diagnosis not present

## 2019-04-27 DIAGNOSIS — T8189XD Other complications of procedures, not elsewhere classified, subsequent encounter: Secondary | ICD-10-CM | POA: Diagnosis not present

## 2019-04-27 DIAGNOSIS — Z4801 Encounter for change or removal of surgical wound dressing: Secondary | ICD-10-CM | POA: Diagnosis not present

## 2019-04-29 DIAGNOSIS — Z170 Estrogen receptor positive status [ER+]: Secondary | ICD-10-CM | POA: Diagnosis not present

## 2019-04-29 DIAGNOSIS — I10 Essential (primary) hypertension: Secondary | ICD-10-CM | POA: Diagnosis not present

## 2019-04-29 DIAGNOSIS — Z4801 Encounter for change or removal of surgical wound dressing: Secondary | ICD-10-CM | POA: Diagnosis not present

## 2019-04-29 DIAGNOSIS — T8189XD Other complications of procedures, not elsewhere classified, subsequent encounter: Secondary | ICD-10-CM | POA: Diagnosis not present

## 2019-04-29 DIAGNOSIS — E119 Type 2 diabetes mellitus without complications: Secondary | ICD-10-CM | POA: Diagnosis not present

## 2019-04-29 DIAGNOSIS — C50411 Malignant neoplasm of upper-outer quadrant of right female breast: Secondary | ICD-10-CM | POA: Diagnosis not present

## 2019-05-02 DIAGNOSIS — E119 Type 2 diabetes mellitus without complications: Secondary | ICD-10-CM | POA: Diagnosis not present

## 2019-05-02 DIAGNOSIS — C50411 Malignant neoplasm of upper-outer quadrant of right female breast: Secondary | ICD-10-CM | POA: Diagnosis not present

## 2019-05-02 DIAGNOSIS — Z170 Estrogen receptor positive status [ER+]: Secondary | ICD-10-CM | POA: Diagnosis not present

## 2019-05-02 DIAGNOSIS — T8189XD Other complications of procedures, not elsewhere classified, subsequent encounter: Secondary | ICD-10-CM | POA: Diagnosis not present

## 2019-05-02 DIAGNOSIS — Z4801 Encounter for change or removal of surgical wound dressing: Secondary | ICD-10-CM | POA: Diagnosis not present

## 2019-05-02 DIAGNOSIS — I10 Essential (primary) hypertension: Secondary | ICD-10-CM | POA: Diagnosis not present

## 2019-05-04 DIAGNOSIS — C50411 Malignant neoplasm of upper-outer quadrant of right female breast: Secondary | ICD-10-CM | POA: Diagnosis not present

## 2019-05-04 DIAGNOSIS — Z170 Estrogen receptor positive status [ER+]: Secondary | ICD-10-CM | POA: Diagnosis not present

## 2019-05-09 DIAGNOSIS — I10 Essential (primary) hypertension: Secondary | ICD-10-CM | POA: Diagnosis not present

## 2019-05-09 DIAGNOSIS — T8189XD Other complications of procedures, not elsewhere classified, subsequent encounter: Secondary | ICD-10-CM | POA: Diagnosis not present

## 2019-05-09 DIAGNOSIS — E119 Type 2 diabetes mellitus without complications: Secondary | ICD-10-CM | POA: Diagnosis not present

## 2019-05-09 DIAGNOSIS — Z4801 Encounter for change or removal of surgical wound dressing: Secondary | ICD-10-CM | POA: Diagnosis not present

## 2019-05-09 DIAGNOSIS — C50411 Malignant neoplasm of upper-outer quadrant of right female breast: Secondary | ICD-10-CM | POA: Diagnosis not present

## 2019-05-09 DIAGNOSIS — Z170 Estrogen receptor positive status [ER+]: Secondary | ICD-10-CM | POA: Diagnosis not present

## 2019-05-11 DIAGNOSIS — Z170 Estrogen receptor positive status [ER+]: Secondary | ICD-10-CM | POA: Diagnosis not present

## 2019-05-11 DIAGNOSIS — C50411 Malignant neoplasm of upper-outer quadrant of right female breast: Secondary | ICD-10-CM | POA: Diagnosis not present

## 2019-05-12 DIAGNOSIS — N189 Chronic kidney disease, unspecified: Secondary | ICD-10-CM | POA: Diagnosis not present

## 2019-05-12 DIAGNOSIS — E782 Mixed hyperlipidemia: Secondary | ICD-10-CM | POA: Diagnosis not present

## 2019-05-12 DIAGNOSIS — E7800 Pure hypercholesterolemia, unspecified: Secondary | ICD-10-CM | POA: Diagnosis not present

## 2019-05-12 DIAGNOSIS — E1142 Type 2 diabetes mellitus with diabetic polyneuropathy: Secondary | ICD-10-CM | POA: Diagnosis not present

## 2019-05-12 DIAGNOSIS — E1122 Type 2 diabetes mellitus with diabetic chronic kidney disease: Secondary | ICD-10-CM | POA: Diagnosis not present

## 2019-05-12 DIAGNOSIS — I10 Essential (primary) hypertension: Secondary | ICD-10-CM | POA: Diagnosis not present

## 2019-05-12 DIAGNOSIS — E7801 Familial hypercholesterolemia: Secondary | ICD-10-CM | POA: Diagnosis not present

## 2019-05-16 DIAGNOSIS — I4811 Longstanding persistent atrial fibrillation: Secondary | ICD-10-CM | POA: Diagnosis not present

## 2019-05-16 DIAGNOSIS — I10 Essential (primary) hypertension: Secondary | ICD-10-CM | POA: Diagnosis not present

## 2019-05-16 DIAGNOSIS — C50411 Malignant neoplasm of upper-outer quadrant of right female breast: Secondary | ICD-10-CM | POA: Diagnosis not present

## 2019-05-16 DIAGNOSIS — E559 Vitamin D deficiency, unspecified: Secondary | ICD-10-CM | POA: Diagnosis not present

## 2019-05-16 DIAGNOSIS — Z170 Estrogen receptor positive status [ER+]: Secondary | ICD-10-CM | POA: Diagnosis not present

## 2019-05-18 DIAGNOSIS — R300 Dysuria: Secondary | ICD-10-CM | POA: Diagnosis not present

## 2019-05-19 DIAGNOSIS — M8580 Other specified disorders of bone density and structure, unspecified site: Secondary | ICD-10-CM | POA: Diagnosis not present

## 2019-05-19 DIAGNOSIS — Z170 Estrogen receptor positive status [ER+]: Secondary | ICD-10-CM | POA: Diagnosis not present

## 2019-05-19 DIAGNOSIS — C50411 Malignant neoplasm of upper-outer quadrant of right female breast: Secondary | ICD-10-CM | POA: Diagnosis not present

## 2019-05-19 DIAGNOSIS — M8008XA Age-related osteoporosis with current pathological fracture, vertebra(e), initial encounter for fracture: Secondary | ICD-10-CM | POA: Diagnosis not present

## 2019-05-20 DIAGNOSIS — C50911 Malignant neoplasm of unspecified site of right female breast: Secondary | ICD-10-CM | POA: Diagnosis not present

## 2019-05-20 DIAGNOSIS — E782 Mixed hyperlipidemia: Secondary | ICD-10-CM | POA: Diagnosis not present

## 2019-05-20 DIAGNOSIS — E876 Hypokalemia: Secondary | ICD-10-CM | POA: Diagnosis not present

## 2019-05-20 DIAGNOSIS — I4891 Unspecified atrial fibrillation: Secondary | ICD-10-CM | POA: Diagnosis not present

## 2019-05-20 DIAGNOSIS — Z6828 Body mass index (BMI) 28.0-28.9, adult: Secondary | ICD-10-CM | POA: Diagnosis not present

## 2019-05-20 DIAGNOSIS — E1122 Type 2 diabetes mellitus with diabetic chronic kidney disease: Secondary | ICD-10-CM | POA: Diagnosis not present

## 2019-05-20 DIAGNOSIS — I10 Essential (primary) hypertension: Secondary | ICD-10-CM | POA: Diagnosis not present

## 2019-05-30 DIAGNOSIS — C50411 Malignant neoplasm of upper-outer quadrant of right female breast: Secondary | ICD-10-CM | POA: Diagnosis not present

## 2019-05-30 DIAGNOSIS — Z170 Estrogen receptor positive status [ER+]: Secondary | ICD-10-CM | POA: Diagnosis not present

## 2019-06-03 DIAGNOSIS — C50411 Malignant neoplasm of upper-outer quadrant of right female breast: Secondary | ICD-10-CM | POA: Diagnosis not present

## 2019-06-03 DIAGNOSIS — Z170 Estrogen receptor positive status [ER+]: Secondary | ICD-10-CM | POA: Diagnosis not present

## 2019-06-10 DIAGNOSIS — N762 Acute vulvitis: Secondary | ICD-10-CM | POA: Diagnosis not present

## 2019-06-10 DIAGNOSIS — Z6827 Body mass index (BMI) 27.0-27.9, adult: Secondary | ICD-10-CM | POA: Diagnosis not present

## 2019-06-13 DIAGNOSIS — C50411 Malignant neoplasm of upper-outer quadrant of right female breast: Secondary | ICD-10-CM | POA: Diagnosis not present

## 2019-06-13 DIAGNOSIS — Z170 Estrogen receptor positive status [ER+]: Secondary | ICD-10-CM | POA: Diagnosis not present

## 2019-06-20 DIAGNOSIS — C50411 Malignant neoplasm of upper-outer quadrant of right female breast: Secondary | ICD-10-CM | POA: Diagnosis not present

## 2019-06-21 DIAGNOSIS — I4811 Longstanding persistent atrial fibrillation: Secondary | ICD-10-CM | POA: Diagnosis not present

## 2019-06-21 DIAGNOSIS — N189 Chronic kidney disease, unspecified: Secondary | ICD-10-CM | POA: Diagnosis not present

## 2019-06-21 DIAGNOSIS — N764 Abscess of vulva: Secondary | ICD-10-CM | POA: Diagnosis not present

## 2019-06-21 DIAGNOSIS — I10 Essential (primary) hypertension: Secondary | ICD-10-CM | POA: Diagnosis not present

## 2019-06-21 DIAGNOSIS — E782 Mixed hyperlipidemia: Secondary | ICD-10-CM | POA: Diagnosis not present

## 2019-06-21 DIAGNOSIS — E11622 Type 2 diabetes mellitus with other skin ulcer: Secondary | ICD-10-CM | POA: Diagnosis not present

## 2019-06-21 DIAGNOSIS — I499 Cardiac arrhythmia, unspecified: Secondary | ICD-10-CM | POA: Diagnosis not present

## 2019-06-21 DIAGNOSIS — N762 Acute vulvitis: Secondary | ICD-10-CM | POA: Diagnosis not present

## 2019-06-21 DIAGNOSIS — Z170 Estrogen receptor positive status [ER+]: Secondary | ICD-10-CM | POA: Diagnosis not present

## 2019-06-21 DIAGNOSIS — C50411 Malignant neoplasm of upper-outer quadrant of right female breast: Secondary | ICD-10-CM | POA: Diagnosis not present

## 2019-06-22 DIAGNOSIS — C50411 Malignant neoplasm of upper-outer quadrant of right female breast: Secondary | ICD-10-CM | POA: Diagnosis not present

## 2019-06-23 DIAGNOSIS — N764 Abscess of vulva: Secondary | ICD-10-CM | POA: Diagnosis not present

## 2019-06-23 DIAGNOSIS — Z1212 Encounter for screening for malignant neoplasm of rectum: Secondary | ICD-10-CM | POA: Diagnosis not present

## 2019-06-23 DIAGNOSIS — Z170 Estrogen receptor positive status [ER+]: Secondary | ICD-10-CM | POA: Diagnosis not present

## 2019-06-23 DIAGNOSIS — M810 Age-related osteoporosis without current pathological fracture: Secondary | ICD-10-CM | POA: Diagnosis not present

## 2019-06-23 DIAGNOSIS — M8580 Other specified disorders of bone density and structure, unspecified site: Secondary | ICD-10-CM | POA: Diagnosis not present

## 2019-06-23 DIAGNOSIS — C50411 Malignant neoplasm of upper-outer quadrant of right female breast: Secondary | ICD-10-CM | POA: Diagnosis not present

## 2019-06-24 DIAGNOSIS — Z6827 Body mass index (BMI) 27.0-27.9, adult: Secondary | ICD-10-CM | POA: Diagnosis not present

## 2019-06-24 DIAGNOSIS — E1142 Type 2 diabetes mellitus with diabetic polyneuropathy: Secondary | ICD-10-CM | POA: Diagnosis not present

## 2019-06-27 DIAGNOSIS — C50411 Malignant neoplasm of upper-outer quadrant of right female breast: Secondary | ICD-10-CM | POA: Diagnosis not present

## 2019-06-28 DIAGNOSIS — N762 Acute vulvitis: Secondary | ICD-10-CM | POA: Diagnosis not present

## 2019-07-01 DIAGNOSIS — R634 Abnormal weight loss: Secondary | ICD-10-CM | POA: Diagnosis not present

## 2019-07-01 DIAGNOSIS — Z1211 Encounter for screening for malignant neoplasm of colon: Secondary | ICD-10-CM | POA: Diagnosis not present

## 2019-07-01 DIAGNOSIS — Z800 Family history of malignant neoplasm of digestive organs: Secondary | ICD-10-CM | POA: Diagnosis not present

## 2019-07-12 DIAGNOSIS — N764 Abscess of vulva: Secondary | ICD-10-CM | POA: Diagnosis not present

## 2019-07-25 DIAGNOSIS — E559 Vitamin D deficiency, unspecified: Secondary | ICD-10-CM | POA: Diagnosis not present

## 2019-07-25 DIAGNOSIS — C50411 Malignant neoplasm of upper-outer quadrant of right female breast: Secondary | ICD-10-CM | POA: Diagnosis not present

## 2019-07-25 DIAGNOSIS — M8580 Other specified disorders of bone density and structure, unspecified site: Secondary | ICD-10-CM | POA: Diagnosis not present

## 2019-07-25 DIAGNOSIS — Z170 Estrogen receptor positive status [ER+]: Secondary | ICD-10-CM | POA: Diagnosis not present

## 2019-07-27 DIAGNOSIS — C50411 Malignant neoplasm of upper-outer quadrant of right female breast: Secondary | ICD-10-CM | POA: Diagnosis not present

## 2019-07-27 DIAGNOSIS — Z170 Estrogen receptor positive status [ER+]: Secondary | ICD-10-CM | POA: Diagnosis not present

## 2019-07-27 DIAGNOSIS — E1122 Type 2 diabetes mellitus with diabetic chronic kidney disease: Secondary | ICD-10-CM | POA: Diagnosis not present

## 2019-07-27 DIAGNOSIS — I4811 Longstanding persistent atrial fibrillation: Secondary | ICD-10-CM | POA: Diagnosis not present

## 2019-07-27 DIAGNOSIS — Z79811 Long term (current) use of aromatase inhibitors: Secondary | ICD-10-CM | POA: Diagnosis not present

## 2019-07-27 DIAGNOSIS — M810 Age-related osteoporosis without current pathological fracture: Secondary | ICD-10-CM | POA: Diagnosis not present

## 2019-08-09 DIAGNOSIS — H401122 Primary open-angle glaucoma, left eye, moderate stage: Secondary | ICD-10-CM | POA: Diagnosis not present

## 2019-08-09 DIAGNOSIS — H401111 Primary open-angle glaucoma, right eye, mild stage: Secondary | ICD-10-CM | POA: Diagnosis not present

## 2019-08-10 DIAGNOSIS — C50411 Malignant neoplasm of upper-outer quadrant of right female breast: Secondary | ICD-10-CM | POA: Diagnosis not present

## 2019-08-10 DIAGNOSIS — Z170 Estrogen receptor positive status [ER+]: Secondary | ICD-10-CM | POA: Diagnosis not present

## 2019-08-15 DIAGNOSIS — Z01818 Encounter for other preprocedural examination: Secondary | ICD-10-CM | POA: Diagnosis not present

## 2019-08-16 DIAGNOSIS — K644 Residual hemorrhoidal skin tags: Secondary | ICD-10-CM | POA: Diagnosis not present

## 2019-08-16 DIAGNOSIS — R634 Abnormal weight loss: Secondary | ICD-10-CM | POA: Diagnosis not present

## 2019-08-16 DIAGNOSIS — K5730 Diverticulosis of large intestine without perforation or abscess without bleeding: Secondary | ICD-10-CM | POA: Diagnosis not present

## 2019-08-17 DIAGNOSIS — Z882 Allergy status to sulfonamides status: Secondary | ICD-10-CM | POA: Diagnosis not present

## 2019-08-17 DIAGNOSIS — I129 Hypertensive chronic kidney disease with stage 1 through stage 4 chronic kidney disease, or unspecified chronic kidney disease: Secondary | ICD-10-CM | POA: Diagnosis not present

## 2019-08-17 DIAGNOSIS — E119 Type 2 diabetes mellitus without complications: Secondary | ICD-10-CM | POA: Diagnosis not present

## 2019-08-17 DIAGNOSIS — Z853 Personal history of malignant neoplasm of breast: Secondary | ICD-10-CM | POA: Diagnosis not present

## 2019-08-17 DIAGNOSIS — I10 Essential (primary) hypertension: Secondary | ICD-10-CM | POA: Diagnosis not present

## 2019-08-17 DIAGNOSIS — K5730 Diverticulosis of large intestine without perforation or abscess without bleeding: Secondary | ICD-10-CM | POA: Diagnosis not present

## 2019-08-17 DIAGNOSIS — R634 Abnormal weight loss: Secondary | ICD-10-CM | POA: Diagnosis not present

## 2019-08-17 DIAGNOSIS — I4891 Unspecified atrial fibrillation: Secondary | ICD-10-CM | POA: Diagnosis not present

## 2019-08-17 DIAGNOSIS — E1122 Type 2 diabetes mellitus with diabetic chronic kidney disease: Secondary | ICD-10-CM | POA: Diagnosis not present

## 2019-08-17 DIAGNOSIS — K5790 Diverticulosis of intestine, part unspecified, without perforation or abscess without bleeding: Secondary | ICD-10-CM | POA: Diagnosis not present

## 2019-08-17 DIAGNOSIS — Z800 Family history of malignant neoplasm of digestive organs: Secondary | ICD-10-CM | POA: Diagnosis not present

## 2019-08-17 DIAGNOSIS — K644 Residual hemorrhoidal skin tags: Secondary | ICD-10-CM | POA: Diagnosis not present

## 2019-08-17 DIAGNOSIS — N189 Chronic kidney disease, unspecified: Secondary | ICD-10-CM | POA: Diagnosis not present

## 2019-08-17 HISTORY — PX: COLONOSCOPY: SHX174

## 2019-08-19 DIAGNOSIS — I10 Essential (primary) hypertension: Secondary | ICD-10-CM | POA: Diagnosis not present

## 2019-08-19 DIAGNOSIS — E7801 Familial hypercholesterolemia: Secondary | ICD-10-CM | POA: Diagnosis not present

## 2019-08-19 DIAGNOSIS — E1122 Type 2 diabetes mellitus with diabetic chronic kidney disease: Secondary | ICD-10-CM | POA: Diagnosis not present

## 2019-08-23 DIAGNOSIS — E1122 Type 2 diabetes mellitus with diabetic chronic kidney disease: Secondary | ICD-10-CM | POA: Diagnosis not present

## 2019-08-23 DIAGNOSIS — C50911 Malignant neoplasm of unspecified site of right female breast: Secondary | ICD-10-CM | POA: Diagnosis not present

## 2019-08-23 DIAGNOSIS — Z6828 Body mass index (BMI) 28.0-28.9, adult: Secondary | ICD-10-CM | POA: Diagnosis not present

## 2019-08-23 DIAGNOSIS — E782 Mixed hyperlipidemia: Secondary | ICD-10-CM | POA: Diagnosis not present

## 2019-08-23 DIAGNOSIS — E876 Hypokalemia: Secondary | ICD-10-CM | POA: Diagnosis not present

## 2019-08-23 DIAGNOSIS — I10 Essential (primary) hypertension: Secondary | ICD-10-CM | POA: Diagnosis not present

## 2019-08-29 DIAGNOSIS — Z7983 Long term (current) use of bisphosphonates: Secondary | ICD-10-CM | POA: Diagnosis not present

## 2019-08-29 DIAGNOSIS — Z882 Allergy status to sulfonamides status: Secondary | ICD-10-CM | POA: Diagnosis not present

## 2019-08-29 DIAGNOSIS — Z8049 Family history of malignant neoplasm of other genital organs: Secondary | ICD-10-CM | POA: Diagnosis not present

## 2019-08-29 DIAGNOSIS — Z853 Personal history of malignant neoplasm of breast: Secondary | ICD-10-CM | POA: Diagnosis not present

## 2019-08-29 DIAGNOSIS — Z79899 Other long term (current) drug therapy: Secondary | ICD-10-CM | POA: Diagnosis not present

## 2019-08-29 DIAGNOSIS — Z79811 Long term (current) use of aromatase inhibitors: Secondary | ICD-10-CM | POA: Diagnosis not present

## 2019-08-29 DIAGNOSIS — C50411 Malignant neoplasm of upper-outer quadrant of right female breast: Secondary | ICD-10-CM | POA: Diagnosis not present

## 2019-08-29 DIAGNOSIS — Z923 Personal history of irradiation: Secondary | ICD-10-CM | POA: Diagnosis not present

## 2019-08-29 DIAGNOSIS — E1122 Type 2 diabetes mellitus with diabetic chronic kidney disease: Secondary | ICD-10-CM | POA: Diagnosis not present

## 2019-08-29 DIAGNOSIS — I4891 Unspecified atrial fibrillation: Secondary | ICD-10-CM | POA: Diagnosis not present

## 2019-08-29 DIAGNOSIS — N189 Chronic kidney disease, unspecified: Secondary | ICD-10-CM | POA: Diagnosis not present

## 2019-08-29 DIAGNOSIS — R922 Inconclusive mammogram: Secondary | ICD-10-CM | POA: Diagnosis not present

## 2019-08-29 DIAGNOSIS — E785 Hyperlipidemia, unspecified: Secondary | ICD-10-CM | POA: Diagnosis not present

## 2019-08-29 DIAGNOSIS — Z806 Family history of leukemia: Secondary | ICD-10-CM | POA: Diagnosis not present

## 2019-08-29 DIAGNOSIS — I129 Hypertensive chronic kidney disease with stage 1 through stage 4 chronic kidney disease, or unspecified chronic kidney disease: Secondary | ICD-10-CM | POA: Diagnosis not present

## 2019-08-29 DIAGNOSIS — Z7901 Long term (current) use of anticoagulants: Secondary | ICD-10-CM | POA: Diagnosis not present

## 2019-08-29 DIAGNOSIS — Z170 Estrogen receptor positive status [ER+]: Secondary | ICD-10-CM | POA: Diagnosis not present

## 2019-08-29 DIAGNOSIS — Z7984 Long term (current) use of oral hypoglycemic drugs: Secondary | ICD-10-CM | POA: Diagnosis not present

## 2019-08-29 DIAGNOSIS — Z87891 Personal history of nicotine dependence: Secondary | ICD-10-CM | POA: Diagnosis not present

## 2019-08-29 DIAGNOSIS — Z9011 Acquired absence of right breast and nipple: Secondary | ICD-10-CM | POA: Diagnosis not present

## 2019-09-09 DIAGNOSIS — E1122 Type 2 diabetes mellitus with diabetic chronic kidney disease: Secondary | ICD-10-CM | POA: Diagnosis not present

## 2019-09-09 DIAGNOSIS — N189 Chronic kidney disease, unspecified: Secondary | ICD-10-CM | POA: Diagnosis not present

## 2019-09-09 DIAGNOSIS — I129 Hypertensive chronic kidney disease with stage 1 through stage 4 chronic kidney disease, or unspecified chronic kidney disease: Secondary | ICD-10-CM | POA: Diagnosis not present

## 2019-09-16 ENCOUNTER — Ambulatory Visit (INDEPENDENT_AMBULATORY_CARE_PROVIDER_SITE_OTHER): Payer: Medicare Other | Admitting: Cardiovascular Disease

## 2019-09-16 ENCOUNTER — Other Ambulatory Visit: Payer: Self-pay

## 2019-09-16 ENCOUNTER — Encounter: Payer: Self-pay | Admitting: Cardiovascular Disease

## 2019-09-16 VITALS — BP 138/72 | HR 65 | Ht 60.0 in | Wt 152.0 lb

## 2019-09-16 DIAGNOSIS — E7800 Pure hypercholesterolemia, unspecified: Secondary | ICD-10-CM | POA: Diagnosis not present

## 2019-09-16 DIAGNOSIS — Z7901 Long term (current) use of anticoagulants: Secondary | ICD-10-CM | POA: Diagnosis not present

## 2019-09-16 DIAGNOSIS — I10 Essential (primary) hypertension: Secondary | ICD-10-CM | POA: Diagnosis not present

## 2019-09-16 DIAGNOSIS — I4819 Other persistent atrial fibrillation: Secondary | ICD-10-CM | POA: Diagnosis not present

## 2019-09-16 DIAGNOSIS — R0601 Orthopnea: Secondary | ICD-10-CM | POA: Diagnosis not present

## 2019-09-16 DIAGNOSIS — R0602 Shortness of breath: Secondary | ICD-10-CM | POA: Diagnosis not present

## 2019-09-16 NOTE — Progress Notes (Signed)
SUBJECTIVE: Betty Goodman is a 74 y.o. female with persistent atrial fibrillation and hypertension.  She was diagnosed in 10/23/2018 with stage II right-sided breast cancer.  She underwent right partial mastectomy on 02/03/2019 and completed adjuvant radiation on 07/14/2019.  She follows with oncology at [facility].  I personally reviewed the ECG performed today which demonstrates rate controlled atrial fibrillation with low voltage, old inferior infarct pattern, and late R wave transition.  For the past 3 months, she has noticed she has had to sit up in a recliner to sleep due to shortness of breath about 3 times per week.  She denies leg swelling and exertional chest pain.  Her energy levels are good.  Social history: She is originally from [place], [place]. She is widowed. Her husband passed away in 10/22/2017. She has an adult daughter, Min[place] Antis [place] inthe [place] area. She has another daughter in [place] in the [place] area. She has 2 sons in [place]. She raised herself since the age of 67 initially in [place], [place]. She has a strong faith.  Review of Systems: As per "subjective", otherwise negative.  Allergies  Allergen Reactions  . Sulfa Antibiotics     Current Outpatient Medications  Medication Sig Dispense Refill  . alendronate (FOSAMAX) 70 MG tablet Take 70 mg by mouth once a week. Take with a full glass of water on an empty stomach.    Marland Kitchen amLODipine (NORVASC) 10 MG tablet Take 10 mg by mouth daily.    Marland Kitchen apixaban (ELIQUIS) 5 MG TABS tablet Take 1 tablet (5 mg total) by mouth 2 (two) times daily. 28 tablet 0  . atorvastatin (LIPITOR) 40 MG tablet Take 40 mg by mouth daily.    . carvedilol (COREG CR) 20 MG 24 hr capsule Take 20 mg by mouth daily.    Marland Kitchen diltiazem (CARDIZEM) 60 MG tablet Take 1 tablet (60 mg total) by mouth 2 (two) times daily. 180 tablet 3  . fluticasone (FLONASE) 50 MCG/ACT nasal spray Place 2 sprays into the nose daily.    Marland Kitchen glipiZIDE (GLUCOTROL) 5 MG tablet Take  5 mg by mouth daily before breakfast.    . Liraglutide (VICTOZA [place]) Inject 1.8 mg into the skin daily.     Marland Kitchen lisinopril-hydrochlorothiazide (PRINZIDE,ZESTORETIC) 20-12.5 MG per tablet Take 1 tablet by mouth 2 (two) times daily. 60 tablet 3  . loratadine (CLARITIN) 10 MG tablet Take 10 mg by mouth daily.    . metFORMIN (GLUCOPHAGE) 1000 MG tablet Take 1,000 mg by mouth 2 (two) times daily with a meal.     No current facility-administered medications for this visit.    Past Medical History:  Diagnosis Date  . Atrial fibrillation ([place])   . Kidney stone    resulting in stenting  . Type 2 diabetes mellitus ([place])   . Unspecified essential hypertension     Past Surgical History:  Procedure Laterality Date  . ADENOIDECTOMY    . TONSILLECTOMY    . TUBAL LIGATION      Social History   Socioeconomic History  . Marital status: Married    Spouse name: Not on file  . Number of children: 4  . Years of education: Not on file  . Highest education level: Not on file  Occupational History  . Occupation: Retired  Tobacco Use  . Smoking status: Never Smoker  . Smokeless tobacco: Never Used  Substance and Sexual Activity  . Alcohol use: No  . Drug use: Not on file  .  Sexual activity: Not on file  Other Topics Concern  . Not on file  Social History Narrative   Lives with husband one daughter and two grandchildren.   Social Determinants of Health   Financial Resource Strain:   . Difficulty of Paying Living Expenses: Not on file  Food Insecurity:   . Worried About Charity fundraiser in the Last Year: Not on file  . Ran Out of Food in the Last Year: Not on file  Transportation Needs:   . Lack of Transportation (Medical): Not on file  . Lack of Transportation (Non-Medical): Not on file  Physical Activity:   . Days of Exercise per Week: Not on file  . Minutes of Exercise per Session: Not on file  Stress:   . Feeling of Stress : Not on file  Social Connections:   . Frequency of  Communication with Friends and Family: Not on file  . Frequency of Social Gatherings with Friends and Family: Not on file  . Attends Religious Services: Not on file  . Active Member of Clubs or Organizations: Not on file  . Attends Archivist Meetings: Not on file  . Marital Status: Not on file  Intimate Partner Violence:   . Fear of Current or Ex-Partner: Not on file  . Emotionally Abused: Not on file  . Physically Abused: Not on file  . Sexually Abused: Not on file    Orson Slick, LPN was present throughout the entirety of the encounter.  Vitals:   09/16/19 1337  BP: 138/72  Pulse: 65  SpO2: 97%  Weight: 152 lb (68.9 kg)  Height: 5' (1.524 m)    Wt Readings from Last 3 Encounters:  09/16/19 152 lb (68.9 kg)  01/25/19 149 lb (67.6 kg)  08/02/18 156 lb (70.8 kg)     PHYSICAL EXAM General: NAD HEENT: Normal. Neck: No JVD, no thyromegaly. Lungs: Clear to auscultation bilaterally with normal respiratory effort. CV: Regular rate and irregular rhythm, normal S1/S2, no S3, no murmur. No pretibial or periankle edema.   Abdomen: Soft, nontender, no distention.  Neurologic: Alert and oriented.  Psych: Normal affect. Skin: Normal. Musculoskeletal: No gross deformities.      Labs: Lab Results  Component Value Date/Time   K 3.9 09/19/2013 02:27 PM   BUN 23 09/19/2013 02:27 PM   CREATININE 0.84 09/19/2013 02:27 PM   HGB 13.4 09/19/2013 02:27 PM     Lipids: No results found for: LDLCALC, LDLDIRECT, CHOL, TRIG, HDL    Prior CV studies:   The following studies were reviewed today:  Echocardiogram 08/12/2018:  1. The left ventricle has normal systolic function of 69-48%. The cavity size is normal. There is no left ventricular wall thickness. Indeterminate diastolic filling patterns. The left ventricular diastology could not be evaluated secondary to atrial  fibrillation. 2. The mitral valve normal in structure. Regurgitation is mild by color flow  Doppler. 3. Normal tricuspid valve. 4. Tricuspid regurgitation moderate. RVSP 32.5 mmHg. 5. The aortic root is normal is size and structure. 6. The aortic valve is tricuspid. There is mild calcification of the aortic valve. 7. Moderately dilated left atrial size. 8. Normal right atrial size. 9. No atrial level shunt detected by color flow Doppler.   ASSESSMENT AND PLAN:  1.  Persistent atrial fibrillation: Symptomatically stable.  Left atrium moderately dilated by echocardiogram on 08/12/2018.  Heart rate controlled on carvedilol and diltiazem.  Anticoagulated with apixaban 5 mg twice daily.  I would recommend discontinuing amlodipine so that  she will not be on two calcium channel blockers.  2.  Hypertension: Blood pressure is controlled.  No changes to therapy.  I would recommend discontinuing amlodipine so that she will not be on two calcium channel blockers.  3.  Hyperlipidemia: Continue atorvastatin.  4.  Orthopnea/shortness of breath: I will obtain an echocardiogram to assess for interval changes in cardiac structure and function.  I will also obtain a chest x-ray.   Disposition: Follow up 3 months   Kate Sable, M.D., F.A.C.C.

## 2019-09-16 NOTE — Patient Instructions (Addendum)
Medication Instructions:  Continue all current medications.  Labwork: none  Testing/Procedures:  Your physician has requested that you have an echocardiogram. Echocardiography is a painless test that uses sound waves to create images of your heart. It provides your doctor with information about the size and shape of your heart and how well your heart's chambers and valves are working. This procedure takes approximately one hour. There are no restrictions for this procedure.  A chest x-ray takes a picture of the organs and structures inside the chest, including the heart, lungs, and blood vessels. This test can show several things, including, whether the heart is enlarges; whether fluid is building up in the lungs; and whether pacemaker / defibrillator leads are still in place.  Office will contact with results via phone or letter.    Follow-Up: 3 months   Any Other Special Instructions Will Be Listed Below (If Applicable).  If you need a refill on your cardiac medications before your next appointment, please call your pharmacy.

## 2019-09-20 DIAGNOSIS — Z79811 Long term (current) use of aromatase inhibitors: Secondary | ICD-10-CM | POA: Diagnosis not present

## 2019-09-20 DIAGNOSIS — Z170 Estrogen receptor positive status [ER+]: Secondary | ICD-10-CM | POA: Diagnosis not present

## 2019-09-20 DIAGNOSIS — E673 Hypervitaminosis D: Secondary | ICD-10-CM | POA: Diagnosis not present

## 2019-09-20 DIAGNOSIS — C50411 Malignant neoplasm of upper-outer quadrant of right female breast: Secondary | ICD-10-CM | POA: Diagnosis not present

## 2019-09-20 DIAGNOSIS — M810 Age-related osteoporosis without current pathological fracture: Secondary | ICD-10-CM | POA: Diagnosis not present

## 2019-09-20 DIAGNOSIS — I10 Essential (primary) hypertension: Secondary | ICD-10-CM | POA: Diagnosis not present

## 2019-09-21 ENCOUNTER — Telehealth: Payer: Self-pay | Admitting: Cardiovascular Disease

## 2019-09-21 DIAGNOSIS — R0602 Shortness of breath: Secondary | ICD-10-CM | POA: Diagnosis not present

## 2019-09-21 DIAGNOSIS — I517 Cardiomegaly: Secondary | ICD-10-CM | POA: Diagnosis not present

## 2019-09-21 NOTE — Telephone Encounter (Signed)
I called and discussed the chest x-ray results with Mrs. Ganser.  She was grateful for the call.

## 2019-09-22 NOTE — Telephone Encounter (Signed)
Noted.   Copy to pcp.

## 2019-10-12 DIAGNOSIS — I10 Essential (primary) hypertension: Secondary | ICD-10-CM | POA: Diagnosis not present

## 2019-10-12 DIAGNOSIS — E7849 Other hyperlipidemia: Secondary | ICD-10-CM | POA: Diagnosis not present

## 2019-10-20 ENCOUNTER — Other Ambulatory Visit: Payer: Self-pay

## 2019-10-20 ENCOUNTER — Ambulatory Visit (INDEPENDENT_AMBULATORY_CARE_PROVIDER_SITE_OTHER): Payer: Medicare Other

## 2019-10-20 DIAGNOSIS — R0602 Shortness of breath: Secondary | ICD-10-CM

## 2019-10-21 ENCOUNTER — Telehealth: Payer: Self-pay | Admitting: *Deleted

## 2019-10-21 ENCOUNTER — Telehealth: Payer: Self-pay

## 2019-10-21 NOTE — Telephone Encounter (Signed)
Will forward to [place] office.  

## 2019-10-21 NOTE — Telephone Encounter (Signed)
-----   Message from Merlene Laughter, RN sent at 10/21/2019  7:29 AM EDT -----  ----- Message ----- From: Herminio Commons, MD Sent: 10/20/2019   4:40 PM EDT To: Merlene Laughter, RN  Pumping function is normal.  There is moderate to severe tricuspid valve leakage.  I will continue to monitor.

## 2019-10-21 NOTE — Telephone Encounter (Signed)
Laurine Blazer, [place]  0/03/9067 93:40 AM EDT    Patient notified. Copy to pmd. Follow up scheduled for June with Dr. Bronson Ing.

## 2019-10-21 NOTE — Telephone Encounter (Signed)
Pt requesting a call from [place]   Please call [phone]   Thanks renee

## 2019-10-24 NOTE — Telephone Encounter (Signed)
She wanted Dr Bronson Ing know that she got her message

## 2019-10-24 NOTE — Telephone Encounter (Signed)
Left message to return call 

## 2019-10-25 NOTE — Telephone Encounter (Signed)
Provider aware

## 2019-10-29 DIAGNOSIS — L03115 Cellulitis of right lower limb: Secondary | ICD-10-CM | POA: Diagnosis not present

## 2019-10-29 DIAGNOSIS — Z23 Encounter for immunization: Secondary | ICD-10-CM | POA: Diagnosis not present

## 2019-10-29 DIAGNOSIS — S90921A Unspecified superficial injury of right foot, initial encounter: Secondary | ICD-10-CM | POA: Diagnosis not present

## 2019-11-07 DIAGNOSIS — H401122 Primary open-angle glaucoma, left eye, moderate stage: Secondary | ICD-10-CM | POA: Diagnosis not present

## 2019-11-07 DIAGNOSIS — E113293 Type 2 diabetes mellitus with mild nonproliferative diabetic retinopathy without macular edema, bilateral: Secondary | ICD-10-CM | POA: Diagnosis not present

## 2019-11-07 DIAGNOSIS — H401111 Primary open-angle glaucoma, right eye, mild stage: Secondary | ICD-10-CM | POA: Diagnosis not present

## 2019-11-08 ENCOUNTER — Other Ambulatory Visit: Payer: Self-pay | Admitting: Cardiovascular Disease

## 2019-11-08 MED ORDER — DILTIAZEM HCL 60 MG PO TABS: 60.0000 mg | ORAL_TABLET | Freq: Two times a day (BID) | ORAL | 3 refills | Status: DC

## 2019-11-08 NOTE — Telephone Encounter (Signed)
*  STAT* If patient is at the pharmacy, call can be transferred to refill team.   1. Which medications need to be refilled?  diltiazem (CARDIZEM) 60 MG tablet   2. Which pharmacy/location (including street and city if local pharmacy) is medication to be sent to? Mail - [place]  3. Do they need a 30 day or 90 day supply?

## 2019-11-08 NOTE — Telephone Encounter (Signed)
Medication sent to pharmacy  

## 2019-11-09 DIAGNOSIS — C50411 Malignant neoplasm of upper-outer quadrant of right female breast: Secondary | ICD-10-CM | POA: Diagnosis not present

## 2019-11-09 DIAGNOSIS — Z170 Estrogen receptor positive status [ER+]: Secondary | ICD-10-CM | POA: Diagnosis not present

## 2019-11-10 DIAGNOSIS — I10 Essential (primary) hypertension: Secondary | ICD-10-CM | POA: Diagnosis not present

## 2019-11-10 DIAGNOSIS — L03115 Cellulitis of right lower limb: Secondary | ICD-10-CM | POA: Diagnosis not present

## 2019-11-10 DIAGNOSIS — S90912A Unspecified superficial injury of left ankle, initial encounter: Secondary | ICD-10-CM | POA: Diagnosis not present

## 2019-11-11 DIAGNOSIS — E7800 Pure hypercholesterolemia, unspecified: Secondary | ICD-10-CM | POA: Diagnosis not present

## 2019-11-11 DIAGNOSIS — E1142 Type 2 diabetes mellitus with diabetic polyneuropathy: Secondary | ICD-10-CM | POA: Diagnosis not present

## 2019-11-11 DIAGNOSIS — I10 Essential (primary) hypertension: Secondary | ICD-10-CM | POA: Diagnosis not present

## 2019-11-11 DIAGNOSIS — E782 Mixed hyperlipidemia: Secondary | ICD-10-CM | POA: Diagnosis not present

## 2019-11-11 DIAGNOSIS — E1122 Type 2 diabetes mellitus with diabetic chronic kidney disease: Secondary | ICD-10-CM | POA: Diagnosis not present

## 2019-11-11 DIAGNOSIS — N189 Chronic kidney disease, unspecified: Secondary | ICD-10-CM | POA: Diagnosis not present

## 2019-11-11 DIAGNOSIS — I4891 Unspecified atrial fibrillation: Secondary | ICD-10-CM | POA: Diagnosis not present

## 2019-11-11 DIAGNOSIS — E876 Hypokalemia: Secondary | ICD-10-CM | POA: Diagnosis not present

## 2019-11-18 DIAGNOSIS — C50911 Malignant neoplasm of unspecified site of right female breast: Secondary | ICD-10-CM | POA: Diagnosis not present

## 2019-11-18 DIAGNOSIS — E1122 Type 2 diabetes mellitus with diabetic chronic kidney disease: Secondary | ICD-10-CM | POA: Diagnosis not present

## 2019-11-18 DIAGNOSIS — E876 Hypokalemia: Secondary | ICD-10-CM | POA: Diagnosis not present

## 2019-11-18 DIAGNOSIS — I10 Essential (primary) hypertension: Secondary | ICD-10-CM | POA: Diagnosis not present

## 2019-11-18 DIAGNOSIS — E782 Mixed hyperlipidemia: Secondary | ICD-10-CM | POA: Diagnosis not present

## 2019-11-18 DIAGNOSIS — Z0001 Encounter for general adult medical examination with abnormal findings: Secondary | ICD-10-CM | POA: Diagnosis not present

## 2019-11-18 DIAGNOSIS — Z6827 Body mass index (BMI) 27.0-27.9, adult: Secondary | ICD-10-CM | POA: Diagnosis not present

## 2019-12-19 DIAGNOSIS — E673 Hypervitaminosis D: Secondary | ICD-10-CM | POA: Diagnosis not present

## 2019-12-19 DIAGNOSIS — M8580 Other specified disorders of bone density and structure, unspecified site: Secondary | ICD-10-CM | POA: Diagnosis not present

## 2019-12-19 DIAGNOSIS — C50411 Malignant neoplasm of upper-outer quadrant of right female breast: Secondary | ICD-10-CM | POA: Diagnosis not present

## 2019-12-19 DIAGNOSIS — Z170 Estrogen receptor positive status [ER+]: Secondary | ICD-10-CM | POA: Diagnosis not present

## 2019-12-19 DIAGNOSIS — M810 Age-related osteoporosis without current pathological fracture: Secondary | ICD-10-CM | POA: Diagnosis not present

## 2019-12-21 DIAGNOSIS — E1122 Type 2 diabetes mellitus with diabetic chronic kidney disease: Secondary | ICD-10-CM | POA: Diagnosis not present

## 2019-12-21 DIAGNOSIS — I10 Essential (primary) hypertension: Secondary | ICD-10-CM | POA: Diagnosis not present

## 2019-12-21 DIAGNOSIS — N189 Chronic kidney disease, unspecified: Secondary | ICD-10-CM | POA: Diagnosis not present

## 2019-12-21 DIAGNOSIS — Z923 Personal history of irradiation: Secondary | ICD-10-CM | POA: Diagnosis not present

## 2019-12-21 DIAGNOSIS — Z9011 Acquired absence of right breast and nipple: Secondary | ICD-10-CM | POA: Diagnosis not present

## 2019-12-21 DIAGNOSIS — I129 Hypertensive chronic kidney disease with stage 1 through stage 4 chronic kidney disease, or unspecified chronic kidney disease: Secondary | ICD-10-CM | POA: Diagnosis not present

## 2019-12-21 DIAGNOSIS — Z9289 Personal history of other medical treatment: Secondary | ICD-10-CM | POA: Diagnosis not present

## 2019-12-21 DIAGNOSIS — Z853 Personal history of malignant neoplasm of breast: Secondary | ICD-10-CM | POA: Diagnosis not present

## 2019-12-21 DIAGNOSIS — Z1212 Encounter for screening for malignant neoplasm of rectum: Secondary | ICD-10-CM | POA: Diagnosis not present

## 2019-12-21 DIAGNOSIS — Z79811 Long term (current) use of aromatase inhibitors: Secondary | ICD-10-CM | POA: Diagnosis not present

## 2019-12-21 DIAGNOSIS — I4891 Unspecified atrial fibrillation: Secondary | ICD-10-CM | POA: Diagnosis not present

## 2019-12-21 DIAGNOSIS — E785 Hyperlipidemia, unspecified: Secondary | ICD-10-CM | POA: Diagnosis not present

## 2019-12-21 DIAGNOSIS — R928 Other abnormal and inconclusive findings on diagnostic imaging of breast: Secondary | ICD-10-CM | POA: Diagnosis not present

## 2019-12-21 DIAGNOSIS — Z87891 Personal history of nicotine dependence: Secondary | ICD-10-CM | POA: Diagnosis not present

## 2020-01-05 ENCOUNTER — Other Ambulatory Visit (HOSPITAL_BASED_OUTPATIENT_CLINIC_OR_DEPARTMENT_OTHER): Payer: Self-pay

## 2020-01-05 DIAGNOSIS — I10 Essential (primary) hypertension: Secondary | ICD-10-CM

## 2020-01-10 ENCOUNTER — Ambulatory Visit: Payer: Medicare Other | Admitting: Cardiovascular Disease

## 2020-01-11 NOTE — Progress Notes (Signed)
Cardiology Office Note  Date: 01/12/2020   ID: Betty Goodman, DOB [birth year], MRN [id number]  PCP:  Betty Mullet, PA-C  Cardiologist:  Betty Sable, Goodman Electrophysiologist:  None   Chief Complaint: Follow-up shortness of breath  History of Present Illness: Betty Goodman is a 74 y.o. female with a history of shortness of breath, hypertension, type 2 diabetes, breast cancer with partial right mastectomy 02/03/2019 and radiation.  Follows with oncology at [facility].  Last visit with Betty Goodman 09/16/2019.  She stated for the prior 3 months she had noticed she had to sit up in a recliner due to shortness of breath about 3 times a week.  She denied any leg swelling or exertional chest pain.  Recent echocardiogram showed EF of 65 to 70%, severe LVH of septal segment.  Severely elevated pulmonary artery systolic pressure, mild left atrial dilatation, right atrium moderately dilated, mild mitral valve regurgitation, moderate to severe tricuspid regurgitation.  Chest x-ray showed cardiomegaly. She has an order for sleep study July 17 at [facility] Dr. Baird Lyons, Goodman.    Recent ED visit to [facility] for HTN on 12/21/2019. Diltiazem 30 mg given orally x 1 to help lower her blood pressure.  Initial blood pressure was 161/100   Patient states she had a recent visit with her PCP who stopped her amlodipine due to being on diltiazem.  Her blood pressure remains elevated today at 140/72.  She denies any chest pain, pressure, tightness, neck, arm, back, or jaw pain.  Denies any associated symptoms.  States she has 2 pillow orthopnea and sometimes has to sleep in a recliner.  States her blood pressures systolic have been running in the 664Q to 034V systolic.   Past Medical History:  Diagnosis Date  . Atrial fibrillation ([place])   . Kidney stone    resulting in stenting  . Type 2 diabetes mellitus ([place])   . Unspecified essential hypertension     Past Surgical History:  Procedure  Laterality Date  . ADENOIDECTOMY    . TONSILLECTOMY    . TUBAL LIGATION      Current Outpatient Medications  Medication Sig Dispense Refill  . alendronate (FOSAMAX) 70 MG tablet Take 70 mg by mouth once a week. Take with a full glass of water on an empty stomach.    Marland Kitchen amLODipine (NORVASC) 10 MG tablet Take 10 mg by mouth daily.    Marland Kitchen apixaban (ELIQUIS) 5 MG TABS tablet Take 1 tablet (5 mg total) by mouth 2 (two) times daily. 28 tablet 0  . atorvastatin (LIPITOR) 40 MG tablet Take 40 mg by mouth daily.    . carvedilol (COREG CR) 20 MG 24 hr capsule Take 20 mg by mouth daily.    Marland Kitchen diltiazem (CARDIZEM) 120 MG tablet Take 360 mg by mouth daily. Not sure if this is long or short acting.    . fluticasone (FLONASE) 50 MCG/ACT nasal spray Place 2 sprays into the nose daily.    Marland Kitchen glipiZIDE (GLUCOTROL) 10 MG tablet Take 10 mg by mouth daily before breakfast.    . Liraglutide (VICTOZA [place]) Inject 1.8 mg into the skin daily.     Marland Kitchen lisinopril-hydrochlorothiazide (PRINZIDE,ZESTORETIC) 20-12.5 MG per tablet Take 1 tablet by mouth 2 (two) times daily. 60 tablet 3  . loratadine (CLARITIN) 10 MG tablet Take 10 mg by mouth daily.    . metFORMIN (GLUCOPHAGE) 1000 MG tablet Take 1,000 mg by mouth 2 (two) times daily with a  meal.    . hydrALAZINE (APRESOLINE) 25 MG tablet Take 1 tablet (25 mg total) by mouth 3 (three) times daily. 90 tablet 6   No current facility-administered medications for this visit.   Allergies:  Sulfa antibiotics   Social History: The patient  reports that she has never smoked. She has never used smokeless tobacco. She reports that she does not drink alcohol.   Family History: The patient's family history includes Cancer in her mother; Leukemia in her sister.   ROS:  Please see the history of present illness. Otherwise, complete review of systems is positive for none.  All other systems are reviewed and negative.   Physical Exam: VS:  BP 140/72   Pulse (!) 45   Ht 5' (1.524 m)   Wt  156 lb 8 oz (71 kg)   SpO2 95%   BMI 30.56 kg/m , BMI Body mass index is 30.56 kg/m.  Wt Readings from Last 3 Encounters:  01/12/20 156 lb 8 oz (71 kg)  09/16/19 152 lb (68.9 kg)  01/25/19 149 lb (67.6 kg)    General: Patient appears comfortable at rest. Neck: Supple, no elevated JVP or carotid bruits, no thyromegaly. Lungs: Clear to auscultation, nonlabored breathing at rest. Cardiac: Regular rate and rhythm, no S3 or significant systolic murmur, no pericardial rub. Extremities: No pitting edema, distal pulses 2+. Skin: Warm and dry. Musculoskeletal: No kyphosis. Neuropsychiatric: Alert and oriented x3, affect grossly appropriate.  ECG:    Recent Labwork: No results found for requested labs within last 8760 hours.  No results found for: CHOL, TRIG, HDL, CHOLHDL, VLDL, LDLCALC, LDLDIRECT  Other Studies Reviewed Today:  Echocardiogram 10/20/2019  1. Left ventricular ejection fraction, by estimation, is 65 to 70%. The left ventricle has normal function. The left ventricle has no regional wall motion abnormalities. There is severe left ventricular hypertrophy of the septal segment. Left ventricular diastolic parameters are indeterminate. 2. Right ventricular systolic function is normal. The right ventricular size is mildly enlarged. There is severely elevated pulmonary artery systolic pressure. 3. Left atrial size was mildly dilated. 4. Right atrial size was moderately dilated. 5. The mitral valve is degenerative. Mild mitral valve regurgitation. 6. Tricuspid valve regurgitation is moderate to severe. 7. The aortic valve is tricuspid. Aortic valve regurgitation is not visualized. Mild aortic valve sclerosis is present, with no evidence of aortic valve stenosis. 8. The inferior vena cava is normal in size with greater than 50% respiratory variability, suggesting right atrial pressure of 3 mmHg. Comparison(s): Previous Echo showed LV EF 60-65%, mild MR, moderate TR, tri-leaflet  aortic valve with mild calcification. moderate LAE.  Echocardiogram 08/12/2018:  1. The left ventricle has normal systolic function of 27-06%. The cavity size is normal. There is no left ventricular wall thickness. Indeterminate diastolic filling patterns. The left ventricular diastology could not be evaluated secondary to atrial  fibrillation. 2. The mitral valve normal in structure. Regurgitation is mild by color flow Doppler. 3. Normal tricuspid valve. 4. Tricuspid regurgitation moderate. RVSP 32.5 mmHg. 5. The aortic root is normal is size and structure. 6. The aortic valve is tricuspid. There is mild calcification of the aortic valve. 7. Moderately dilated left atrial size. 8. Normal right atrial size. 9. No atrial level shunt detected by color flow Doppler.   Assessment and Plan:  1. Persistent atrial fibrillation ([place])   2. Essential hypertension   3. SOB (shortness of breath)   4. Moderate to severe pulmonary hypertension ([place])    1. Persistent atrial  fibrillation ([place]) Heart rate today 45 and regular.  Continue Eliquis 5 mg p.o. twice daily.  Continue diltiazem 120 mg daily.  2. Essential hypertension Blood pressure 140/72 on arrival.  Discontinue amlodipine, continue Cardizem 120 mg daily.  Continue Coreg 20 mg daily.  Continue lisinopril HCTZ 20/12.5 mg p.o. twice daily.  Start hydralazine 25 mg p.o. 3 times daily  3. SOB (shortness of breath) Continues with mild to moderate shortness of breath.  Recent echo showed significantly elevated pulmonary artery systolic pressure at 80, with moderate severe tricuspid regurgitation.  Severe left ventricular hypertrophy of the septal segment on recent echo. Patient has a pending sleep study at [place] sleep disorder clinic on July 17  4. Moderate to severe pulmonary hypertension (HCC) Refer to [facility] pulmonary clinic for management of pulmonary artery hypertension.  Significantly increased PASP at 80 on recent  echo.   Medication Adjustments/Labs and Tests Ordered: Current medicines are reviewed at length with the patient today.  Concerns regarding medicines are outlined above.    Disposition: Follow-up with cardiology or APP 6 months Signed, Levell July, NP 01/12/2020 11:27 AM    [facility] [place], [place], [place] [address] Phone: [phone]; Fax: [phone]

## 2020-01-12 ENCOUNTER — Other Ambulatory Visit: Payer: Self-pay

## 2020-01-12 ENCOUNTER — Ambulatory Visit (INDEPENDENT_AMBULATORY_CARE_PROVIDER_SITE_OTHER): Payer: Medicare Other | Admitting: Family Medicine

## 2020-01-12 ENCOUNTER — Encounter: Payer: Self-pay | Admitting: Family Medicine

## 2020-01-12 VITALS — BP 140/72 | HR 45 | Ht 60.0 in | Wt 156.5 lb

## 2020-01-12 DIAGNOSIS — R0602 Shortness of breath: Secondary | ICD-10-CM | POA: Diagnosis not present

## 2020-01-12 DIAGNOSIS — I272 Pulmonary hypertension, unspecified: Secondary | ICD-10-CM

## 2020-01-12 DIAGNOSIS — I4819 Other persistent atrial fibrillation: Secondary | ICD-10-CM

## 2020-01-12 DIAGNOSIS — I10 Essential (primary) hypertension: Secondary | ICD-10-CM | POA: Diagnosis not present

## 2020-01-12 MED ORDER — HYDRALAZINE HCL 25 MG PO TABS
25.0000 mg | ORAL_TABLET | Freq: Three times a day (TID) | ORAL | 6 refills | Status: DC
Start: 2020-01-12 — End: 2021-03-26

## 2020-01-12 NOTE — Progress Notes (Signed)
Yea , I referred her to Pulmonary but didn't know they had a dedicated clinic for Managing PAH. I will ask Baker Janus to send her there instead. Thanks for the heads up.

## 2020-01-12 NOTE — Patient Instructions (Signed)
Medication Instructions:   Begin Hydralazine 25mg  three times per day.  Continue all other medications.    Labwork: none  Testing/Procedures: none  Follow-Up: 6 months  Any Other Special Instructions Will Be Listed Below (If Applicable). You have been referred to:  [facility] Pulmonology   If you need a refill on your cardiac medications before your next appointment, please call your pharmacy.

## 2020-01-12 NOTE — Progress Notes (Signed)
She also has a split study coming up at Sleep disorder clinic on the 17 th

## 2020-01-25 DIAGNOSIS — Z20828 Contact with and (suspected) exposure to other viral communicable diseases: Secondary | ICD-10-CM | POA: Diagnosis not present

## 2020-01-28 ENCOUNTER — Ambulatory Visit (HOSPITAL_BASED_OUTPATIENT_CLINIC_OR_DEPARTMENT_OTHER): Payer: Medicare Other | Admitting: Internal Medicine

## 2020-02-04 ENCOUNTER — Other Ambulatory Visit: Payer: Self-pay

## 2020-02-04 ENCOUNTER — Ambulatory Visit (HOSPITAL_BASED_OUTPATIENT_CLINIC_OR_DEPARTMENT_OTHER): Payer: Medicare Other | Attending: General Practice | Admitting: Internal Medicine

## 2020-02-04 VITALS — Ht 60.0 in | Wt 150.0 lb

## 2020-02-04 DIAGNOSIS — I10 Essential (primary) hypertension: Secondary | ICD-10-CM

## 2020-02-04 DIAGNOSIS — I493 Ventricular premature depolarization: Secondary | ICD-10-CM | POA: Diagnosis not present

## 2020-02-04 DIAGNOSIS — I4891 Unspecified atrial fibrillation: Secondary | ICD-10-CM | POA: Insufficient documentation

## 2020-02-04 DIAGNOSIS — G4733 Obstructive sleep apnea (adult) (pediatric): Secondary | ICD-10-CM | POA: Insufficient documentation

## 2020-02-12 DIAGNOSIS — I10 Essential (primary) hypertension: Secondary | ICD-10-CM | POA: Diagnosis not present

## 2020-02-12 DIAGNOSIS — G4733 Obstructive sleep apnea (adult) (pediatric): Secondary | ICD-10-CM | POA: Diagnosis not present

## 2020-02-12 NOTE — Procedures (Signed)
Patient Name: Betty Goodman, Betty Goodman Study Date: 02/04/2020 Gender: Female D.O.B: 1946-02-16 Age (years): 73 Referring Provider: Jalene Mullet PA-C Height (inches): 60 Interpreting Physician: Baird Lyons MD, ABSM Weight (lbs): 150 RPSGT: Lanae Boast BMI: 29 MRN: [id number] Neck Size: 13.50  CLINICAL INFORMATION Sleep Study Type: NPSG Indication for sleep study: Hypertension, Snoring Epworth Sleepiness Score: 2  SLEEP STUDY TECHNIQUE As per the AASM Manual for the Scoring of Sleep and Associated Events v2.3 (April 2016) with a hypopnea requiring 4% desaturations.  The channels recorded and monitored were frontal, central and occipital EEG, electrooculogram (EOG), submentalis EMG (chin), nasal and oral airflow, thoracic and abdominal wall motion, anterior tibialis EMG, snore microphone, electrocardiogram, and pulse oximetry.  MEDICATIONS Medications self-administered by patient taken the night of the study : none repoted  SLEEP ARCHITECTURE The study was initiated at 10:18:39 PM and ended at 5:11:46 AM.  Sleep onset time was 55.8 minutes and the sleep efficiency was 36.0%. The total sleep time was 148.8 minutes.  Stage REM latency was N/A minutes.  The patient spent 30.92% of the night in stage N1 sleep, 69.08% in stage N2 sleep, 0.00% in stage N3 and 0% in REM.  Alpha intrusion was absent.  Supine sleep was 53.63%.  RESPIRATORY PARAMETERS The overall apnea/hypopnea index (AHI) was 16.1 per hour. There were 20 total apneas, including 10 obstructive, 7 central and 3 mixed apneas. There were 20 hypopneas and 64 RERAs.  The AHI during Stage REM sleep was N/A per hour.  AHI while supine was 26.3 per hour.  The mean oxygen saturation was 90.93%. The minimum SpO2 during sleep was 82.00%.  soft snoring was noted during this study.  CARDIAC DATA The 2 lead EKG demonstrated atrial fibrillation. The mean heart rate was 61.20 beats per minute. Other EKG findings include:  PVCs.  LEG MOVEMENT DATA The total PLMS were 0 with a resulting PLMS index of 0.00. Associated arousal with leg movement index was 0.0 .  IMPRESSIONS - Moderate obstructive sleep apnea occurred during this study (AHI = 16.1/h) - Difficulty initiating and maintaining sleep with insuficient early sleep to meet protocol reuirements for split CPAP titration. - No significant central sleep apnea occurred during this study (CAI = 2.8/h). - Oxygen desaturation was noted during this study (Min O2 = 82.00%). Mean sat 92.6%. - The patient snored with soft snoring volume. - EKG findings include Atrial Fibrillation, PVCs. - Clinically significant periodic limb movements did not occur during sleep. No significant associated arousals.  DIAGNOSIS - Obstructive Sleep Apnea (G47.33)  RECOMMENDATIONS - Suggest CPAP titration sleep study or autopap. Other options would be based on clinical judgment. - Patient was fitted with a nasal pillows mask after demonstrating significant claustrophobia with a full-face CPAP mask at the start of the study. - Be careful with alcohol, sedatives and other CNS depressants that may worsen sleep apnea and disrupt normal sleep architecture. - Sleep hygiene should be reviewed to assess factors that may improve sleep quality. - Weight management and regular exercise should be initiated or continued if appropriate.  [Electronically signed] 02/12/2020 11:28 AM  Baird Lyons MD, ABSM Diplomate, American Board of Sleep Medicine   NPI: [id number]                          [place]  ELECTRONICALLY SIGNED ON:  02/12/2020, 11:19 AM [place] PH: [phone]   FX: [phone] ACCREDITED BY  THE AMERICAN ACADEMY OF SLEEP MEDICINE

## 2020-02-27 DIAGNOSIS — Z08 Encounter for follow-up examination after completed treatment for malignant neoplasm: Secondary | ICD-10-CM | POA: Diagnosis not present

## 2020-02-27 DIAGNOSIS — C50411 Malignant neoplasm of upper-outer quadrant of right female breast: Secondary | ICD-10-CM | POA: Diagnosis not present

## 2020-02-27 DIAGNOSIS — Z170 Estrogen receptor positive status [ER+]: Secondary | ICD-10-CM | POA: Diagnosis not present

## 2020-02-27 DIAGNOSIS — R922 Inconclusive mammogram: Secondary | ICD-10-CM | POA: Diagnosis not present

## 2020-02-27 DIAGNOSIS — Z853 Personal history of malignant neoplasm of breast: Secondary | ICD-10-CM | POA: Diagnosis not present

## 2020-03-01 ENCOUNTER — Encounter: Payer: Self-pay | Admitting: Pulmonary Disease

## 2020-03-01 ENCOUNTER — Ambulatory Visit (INDEPENDENT_AMBULATORY_CARE_PROVIDER_SITE_OTHER): Payer: Medicare Other | Admitting: Pulmonary Disease

## 2020-03-01 ENCOUNTER — Other Ambulatory Visit (HOSPITAL_COMMUNITY)
Admission: RE | Admit: 2020-03-01 | Discharge: 2020-03-01 | Disposition: A | Discharge status: Home / Self Care | Payer: Medicare Other | Source: Ambulatory Visit | Attending: Pulmonary Disease | Admitting: Pulmonary Disease

## 2020-03-01 ENCOUNTER — Other Ambulatory Visit: Payer: Self-pay

## 2020-03-01 VITALS — BP 132/80 | HR 52 | Temp 97.3°F | Ht 61.0 in | Wt 151.3 lb

## 2020-03-01 DIAGNOSIS — D649 Anemia, unspecified: Secondary | ICD-10-CM

## 2020-03-01 DIAGNOSIS — I272 Pulmonary hypertension, unspecified: Secondary | ICD-10-CM | POA: Insufficient documentation

## 2020-03-01 DIAGNOSIS — G4733 Obstructive sleep apnea (adult) (pediatric): Secondary | ICD-10-CM

## 2020-03-01 LAB — CBC WITH DIFFERENTIAL/PLATELET
Abs Immature Granulocytes: 0.03 10*3/uL (ref 0.00–0.07)
Basophils Absolute: 0.1 10*3/uL (ref 0.0–0.1)
Basophils Relative: 1 %
Eosinophils Absolute: 0.2 10*3/uL (ref 0.0–0.5)
Eosinophils Relative: 3 %
HCT: 46.2 % — ABNORMAL HIGH (ref 36.0–46.0)
Hemoglobin: 14.6 g/dL (ref 12.0–15.0)
Immature Granulocytes: 0 %
Lymphocytes Relative: 19 %
Lymphs Abs: 1.4 10*3/uL (ref 0.7–4.0)
MCH: 28 pg (ref 26.0–34.0)
MCHC: 31.6 g/dL (ref 30.0–36.0)
MCV: 88.5 fL (ref 80.0–100.0)
Monocytes Absolute: 0.8 10*3/uL (ref 0.1–1.0)
Monocytes Relative: 11 %
Neutro Abs: 4.8 10*3/uL (ref 1.7–7.7)
Neutrophils Relative %: 66 %
Platelets: 232 10*3/uL (ref 150–400)
RBC: 5.22 MIL/uL — ABNORMAL HIGH (ref 3.87–5.11)
RDW: 14.9 % (ref 11.5–15.5)
WBC: 7.3 10*3/uL (ref 4.0–10.5)
nRBC: 0 % (ref 0.0–0.2)

## 2020-03-01 LAB — COMPREHENSIVE METABOLIC PANEL
ALT: 26 U/L (ref 0–44)
AST: 20 U/L (ref 15–41)
Albumin: 4.1 g/dL (ref 3.5–5.0)
Alkaline Phosphatase: 79 U/L (ref 38–126)
Anion gap: 14 (ref 5–15)
BUN: 24 mg/dL — ABNORMAL HIGH (ref 8–23)
CO2: 25 mmol/L (ref 22–32)
Calcium: 10.2 mg/dL (ref 8.9–10.3)
Chloride: 103 mmol/L (ref 98–111)
Creatinine, Ser: 1.07 mg/dL — ABNORMAL HIGH (ref 0.44–1.00)
GFR calc Af Amer: >60 mL/min (ref >60)
GFR calc non Af Amer: 52 mL/min — ABNORMAL LOW (ref >60)
Glucose, Bld: 235 mg/dL — ABNORMAL HIGH (ref 70–99)
Potassium: 3.6 mmol/L (ref 3.5–5.1)
Sodium: 142 mmol/L (ref 135–145)
Total Bilirubin: 0.9 mg/dL (ref 0.3–1.2)
Total Protein: 7.1 g/dL (ref 6.5–8.1)

## 2020-03-01 NOTE — Progress Notes (Addendum)
[facility]  Chief Complaint  Patient presents with  . Consult    Afib, pulm. hypertension, was recently diagnosed with moderate sleep apnea, denies shortness of breath or cough. Split night done 02/04/20 and sleep study done 02/08/20    Constitutional:  BP 132/80 (BP Location: Left Arm, Patient Position: Sitting, Cuff Size: Normal)   Pulse (!) 52   Temp (!) 97.3 F (36.3 C) (Temporal)   Ht 5\' 1"  (1.549 m)   Wt 151 lb 4.8 oz (68.6 kg)   SpO2 99%   BMI 28.59 kg/m   Past Medical History:  Persistent A fib, Nephrolithiasis, DM 2, HTN, Rt breast cancer 2020  Summary:  Betty Goodman is a 74 y.o. female never smoker with pulmonary hypertension.  Subjective:  She is followed by cardiology for A fib.  She had Echo which showed pulmonary hypertension.  She was also found to have moderate sleep apnea.  She never smoked cigarette.  She working in [employer].  She had exposure to chemical cleaning agents, and would get sinus congestion with exposure.  No history of asthma.  Denies history of CTD or thromboembolic disease.  Wasn't ever on diet pills.  No skin rash, joint swelling, trouble swallowing, dry eyes/mouth.  Doesn't feel like she has trouble with her breathing with activity.  Physical Exam:   Appearance - well kempt  ENMT - no sinus tenderness, no nasal discharge, no oral exudate, Mallampati 3, prominent turbinates, deviated septum  Respiratory - no wheeze, or rales  CV - regular rate and rhythm, no murmurs  GI - soft, non tender  Lymph - no adenopathy noted in neck  Ext - no edema  Skin - no rashes  Neuro - normal strength, oriented x 3  Psych - normal mood and affect  Discussion:  She has history of atrial fibrillation and hypertension.  She had Echo that showed elevated PA pressures.  She had sleep study that showed moderate obstructive sleep apnea.  While sleep apnea can contribute to pulmonary hypertension, it would  be expected to cause this degree of pressure elevation.  She needs assessment for additional causes of pulmonary hypertension.  Assessment/Plan:   Pulmonary hypertension. - will arrange for CMET, CBC with Diff, RF, ANA, Scl 70, CXR, V/Q scan, PFT - depending on test results might need HRCT chest - might ultimately need Rt heart catheterization  Obstructive sleep apnea. - reviewed her sleep study with her  - discussed how sleep apnea can impact her health - treatment options reviewed - will arrange for auto CPAP set up - she will need ONO with CPAP  A total of 64 minutes spent addressing patient care issues on day of visit.  Follow up:   Patient Instructions  Will arrange for lab tests, chest xray, V/Q scan, pulmonary function test  Will arrange for auto CPAP set up and overnight oxygen test while using CPAP  Follow up in 4 weeks  Signature:  Chesley Mires, MD [place] Pager: [phone] 03/01/2020, 12:58 PM Flow Sheet     Pulmonary tests:    Sleep tests:   PSG 02/04/20 >> AHI 16.1, SpO2 low 82%  Cardiac tests:   Echo 10/20/19 >> EF 65 to 70%, severe LVH, PASP 80 mmHg, mod RA dilation, mild MR, mod/severe TR  Medications:   Allergies as of 03/01/2020      Reactions   Sulfa Antibiotics       Medication List  Accurate as of March 01, 2020 12:57 PM. If you have any questions, ask your nurse or doctor.        alendronate 70 MG tablet Commonly known as: FOSAMAX Take 70 mg by mouth once a week. Take with a full glass of water on an empty stomach.   amLODipine 10 MG tablet Commonly known as: NORVASC Take 10 mg by mouth daily.   apixaban 5 MG Tabs tablet Commonly known as: ELIQUIS Take 1 tablet (5 mg total) by mouth 2 (two) times daily.   atorvastatin 40 MG tablet Commonly known as: LIPITOR Take 40 mg by mouth daily.   carvedilol 20 MG 24 hr capsule Commonly known as: COREG CR Take 20 mg by mouth daily.   diltiazem 120 MG  tablet Commonly known as: CARDIZEM Take 360 mg by mouth daily. Not sure if this is long or short acting.   fluticasone 50 MCG/ACT nasal spray Commonly known as: FLONASE Place 2 sprays into the nose daily.   glipiZIDE 10 MG tablet Commonly known as: GLUCOTROL Take 10 mg by mouth daily before breakfast.   hydrALAZINE 25 MG tablet Commonly known as: APRESOLINE Take 1 tablet (25 mg total) by mouth 3 (three) times daily.   lisinopril-hydrochlorothiazide 20-12.5 MG tablet Commonly known as: ZESTORETIC Take 1 tablet by mouth 2 (two) times daily.   loratadine 10 MG tablet Commonly known as: CLARITIN Take 10 mg by mouth daily.   metFORMIN 1000 MG tablet Commonly known as: GLUCOPHAGE Take 1,000 mg by mouth 2 (two) times daily with a meal.   VICTOZA [place] Inject 1.8 mg into the skin daily.       Past Surgical History:  She  has a past surgical history that includes Tubal ligation; Tonsillectomy; and Adenoidectomy.  Family History:  Her family history includes Cancer in her mother; Leukemia in her sister.  Social History:  She  reports that she has never smoked. She has never used smokeless tobacco. She reports that she does not drink alcohol.

## 2020-03-01 NOTE — Patient Instructions (Signed)
Will arrange for lab tests, chest xray, V/Q scan, pulmonary function test  Will arrange for auto CPAP set up and overnight oxygen test while using CPAP  Follow up in 4 weeks

## 2020-03-02 LAB — ANTINUCLEAR ANTIBODIES, IFA: ANA Ab, IFA: NEGATIVE

## 2020-03-02 LAB — ANTI-SCLERODERMA ANTIBODY: Scleroderma (Scl-70) (ENA) Antibody, IgG: <0.2 AI (ref 0.0–0.9)

## 2020-03-02 LAB — RHEUMATOID FACTOR: Rheumatoid fact SerPl-aCnc: 10 IU/mL (ref 0.0–13.9)

## 2020-03-07 ENCOUNTER — Telehealth: Payer: Self-pay | Admitting: Pulmonary Disease

## 2020-03-07 NOTE — Telephone Encounter (Signed)
Spoke with pt and notified of results per Dr. Sood. Pt verbalized understanding and denied any questions.  

## 2020-03-07 NOTE — Telephone Encounter (Signed)
CMP Latest Ref Rng & Units 03/01/2020 09/19/2013  Glucose 70 - 99 mg/dL 235(H) 160(H)  BUN 8 - 23 mg/dL 24(H) 23  Creatinine 0.44 - 1.00 mg/dL 1.07(H) 0.84  Sodium 135 - 145 mmol/L 142 143  Potassium 3.5 - 5.1 mmol/L 3.6 3.9  Chloride 98 - 111 mmol/L 103 100  CO2 22 - 32 mmol/L 25 32  Calcium 8.9 - 10.3 mg/dL 10.2 9.9  Total Protein 6.5 - 8.1 g/dL 7.1 -  Total Bilirubin 0.3 - 1.2 mg/dL 0.9 -  Alkaline Phos 38 - 126 U/L 79 -  AST 15 - 41 U/L 20 -  ALT 0 - 44 U/L 26 -    CBC    Component Value Date/Time   WBC 7.3 03/01/2020 1152   RBC 5.22 (H) 03/01/2020 1152   HGB 14.6 03/01/2020 1152   HCT 46.2 (H) 03/01/2020 1152   PLT 232 03/01/2020 1152   MCV 88.5 03/01/2020 1152   MCH 28.0 03/01/2020 1152   MCHC 31.6 03/01/2020 1152   RDW 14.9 03/01/2020 1152   LYMPHSABS 1.4 03/01/2020 1152   MONOABS 0.8 03/01/2020 1152   EOSABS 0.2 03/01/2020 1152   BASOSABS 0.1 03/01/2020 1152    Lab Results  Component Value Date   ANA Negative 03/01/2020   RF 10.0 03/01/2020    Please let her know her labs were normal except for an increase in her blood sugar related to her diabetes.  She should follow up with her primary care provider for further management of her diabetes.

## 2020-03-12 ENCOUNTER — Encounter (HOSPITAL_COMMUNITY): Payer: Medicare Other

## 2020-03-22 ENCOUNTER — Ambulatory Visit (HOSPITAL_COMMUNITY): Admission: RE | Admit: 2020-03-22 | Payer: Medicare Other | Source: Ambulatory Visit

## 2020-03-26 ENCOUNTER — Telehealth: Payer: Self-pay | Admitting: Pulmonary Disease

## 2020-03-26 NOTE — Telephone Encounter (Signed)
CXR does not require an appointment.  Left detailed message on pt's VM regarding this.

## 2020-03-29 DIAGNOSIS — E782 Mixed hyperlipidemia: Secondary | ICD-10-CM | POA: Diagnosis not present

## 2020-03-29 DIAGNOSIS — Z1159 Encounter for screening for other viral diseases: Secondary | ICD-10-CM | POA: Diagnosis not present

## 2020-03-29 DIAGNOSIS — E1122 Type 2 diabetes mellitus with diabetic chronic kidney disease: Secondary | ICD-10-CM | POA: Diagnosis not present

## 2020-03-29 DIAGNOSIS — N189 Chronic kidney disease, unspecified: Secondary | ICD-10-CM | POA: Diagnosis not present

## 2020-03-29 DIAGNOSIS — I10 Essential (primary) hypertension: Secondary | ICD-10-CM | POA: Diagnosis not present

## 2020-03-29 DIAGNOSIS — E875 Hyperkalemia: Secondary | ICD-10-CM | POA: Diagnosis not present

## 2020-03-30 ENCOUNTER — Ambulatory Visit: Payer: Medicare Other | Admitting: Pulmonary Disease

## 2020-04-03 DIAGNOSIS — Z6828 Body mass index (BMI) 28.0-28.9, adult: Secondary | ICD-10-CM | POA: Diagnosis not present

## 2020-04-03 DIAGNOSIS — E1122 Type 2 diabetes mellitus with diabetic chronic kidney disease: Secondary | ICD-10-CM | POA: Diagnosis not present

## 2020-04-03 DIAGNOSIS — E876 Hypokalemia: Secondary | ICD-10-CM | POA: Diagnosis not present

## 2020-04-03 DIAGNOSIS — I10 Essential (primary) hypertension: Secondary | ICD-10-CM | POA: Diagnosis not present

## 2020-04-03 DIAGNOSIS — E782 Mixed hyperlipidemia: Secondary | ICD-10-CM | POA: Diagnosis not present

## 2020-04-03 DIAGNOSIS — Z7189 Other specified counseling: Secondary | ICD-10-CM | POA: Diagnosis not present

## 2020-04-03 DIAGNOSIS — C50911 Malignant neoplasm of unspecified site of right female breast: Secondary | ICD-10-CM | POA: Diagnosis not present

## 2020-04-09 DIAGNOSIS — Z20828 Contact with and (suspected) exposure to other viral communicable diseases: Secondary | ICD-10-CM | POA: Diagnosis not present

## 2020-04-12 ENCOUNTER — Other Ambulatory Visit: Payer: Self-pay

## 2020-04-12 ENCOUNTER — Ambulatory Visit (HOSPITAL_COMMUNITY)
Admission: RE | Admit: 2020-04-12 | Discharge: 2020-04-12 | Disposition: A | Discharge status: Home / Self Care | Payer: Medicare Other | Source: Ambulatory Visit | Attending: Pulmonary Disease | Admitting: Pulmonary Disease

## 2020-04-12 DIAGNOSIS — E1122 Type 2 diabetes mellitus with diabetic chronic kidney disease: Secondary | ICD-10-CM | POA: Diagnosis not present

## 2020-04-12 DIAGNOSIS — I272 Pulmonary hypertension, unspecified: Secondary | ICD-10-CM | POA: Insufficient documentation

## 2020-04-12 DIAGNOSIS — I4819 Other persistent atrial fibrillation: Secondary | ICD-10-CM | POA: Diagnosis not present

## 2020-04-12 DIAGNOSIS — N182 Chronic kidney disease, stage 2 (mild): Secondary | ICD-10-CM | POA: Diagnosis not present

## 2020-04-12 DIAGNOSIS — I129 Hypertensive chronic kidney disease with stage 1 through stage 4 chronic kidney disease, or unspecified chronic kidney disease: Secondary | ICD-10-CM | POA: Diagnosis not present

## 2020-04-12 DIAGNOSIS — I10 Essential (primary) hypertension: Secondary | ICD-10-CM | POA: Diagnosis not present

## 2020-04-12 DIAGNOSIS — E7849 Other hyperlipidemia: Secondary | ICD-10-CM | POA: Diagnosis not present

## 2020-04-12 DIAGNOSIS — J984 Other disorders of lung: Secondary | ICD-10-CM | POA: Diagnosis not present

## 2020-04-12 LAB — PULMONARY FUNCTION TEST
DL/VA % pred: 98 %
DL/VA: 4.16 ml/min/mmHg/L
DLCO unc % pred: 95 %
DLCO unc: 16.54 ml/min/mmHg
FEF 25-75 Post: 2.16 L/sec
FEF 25-75 Pre: 2.39 L/sec
FEF2575-%Change-Post: -9 %
FEF2575-%Pred-Post: 152 %
FEF2575-%Pred-Pre: 169 %
FEV1-%Change-Post: 0 %
FEV1-%Pred-Post: 132 %
FEV1-%Pred-Pre: 133 %
FEV1-Post: 2.01 L
FEV1-Pre: 2.03 L
FEV1FVC-%Change-Post: 1 %
FEV1FVC-%Pred-Pre: 106 %
FEV6-%Change-Post: -1 %
FEV6-%Pred-Post: 128 %
FEV6-%Pred-Pre: 130 %
FEV6-Post: 2.42 L
FEV6-Pre: 2.46 L
FEV6FVC-%Pred-Post: 104 %
FEV6FVC-%Pred-Pre: 104 %
FVC-%Change-Post: -2 %
FVC-%Pred-Post: 122 %
FVC-%Pred-Pre: 125 %
FVC-Post: 2.42 L
FVC-Pre: 2.47 L
Post FEV1/FVC ratio: 83 %
Post FEV6/FVC ratio: 100 %
Pre FEV1/FVC ratio: 82 %
Pre FEV6/FVC Ratio: 100 %
RV % pred: 125 %
RV: 2.62 L
TLC % pred: 110 %
TLC: 5.07 L

## 2020-04-12 MED ORDER — ALBUTEROL SULFATE (2.5 MG/3ML) 0.083% IN NEBU
2.5000 mg | INHALATION_SOLUTION | Freq: Once | RESPIRATORY_TRACT | Status: AC
  Administered 2020-04-12: 2.5 mg via RESPIRATORY_TRACT

## 2020-04-12 MED ORDER — ALBUTEROL SULFATE (2.5 MG/3ML) 0.083% IN NEBU: 2.5000 mg | INHALATION_SOLUTION | Freq: Once | RESPIRATORY_TRACT | Status: DC

## 2020-04-17 ENCOUNTER — Other Ambulatory Visit: Payer: Self-pay

## 2020-04-17 ENCOUNTER — Encounter: Payer: Self-pay | Admitting: Internal Medicine

## 2020-04-17 ENCOUNTER — Ambulatory Visit (INDEPENDENT_AMBULATORY_CARE_PROVIDER_SITE_OTHER): Payer: Medicare Other | Admitting: Internal Medicine

## 2020-04-17 DIAGNOSIS — I10 Essential (primary) hypertension: Secondary | ICD-10-CM

## 2020-04-17 DIAGNOSIS — I272 Pulmonary hypertension, unspecified: Secondary | ICD-10-CM

## 2020-04-17 NOTE — Progress Notes (Signed)
Betty Goodman, female    DOB: Jun 02, 1946, 74 y.o.   MRN: [id number]   Brief patient profile:  44 yobf never smoker pt of SOOD with OSA being eval for PH here to discuss pfts    History of Present Illness  04/17/2020  Pulmonary/ 1st office eval/ Vanessia Bokh[facility] / [place] [facility]  Chief Complaint  Patient presents with  . Follow-up    VS pt- told to f/u after PFT. PFT done 04/12/20.  She states her breathing is "okay".  She has some nasal congestion.    Dyspnea:  No steps but on flat surface  More limited by knees than breathing Cough: none  Sleep: refuses cpap / sleeping on side with one pillow ok SABA use: none   No obvious day to day or daytime variability or assoc excess/ purulent sputum or mucus plugs or hemoptysis or cp or chest tightness, subjective wheeze or overt sinus or hb symptoms.   Sleeping from her perspective without nocturnal  or early am exacerbation  of respiratory  c/o's or need for noct saba. Also denies any obvious fluctuation of symptoms with weather or environmental changes or other aggravating or alleviating factors except as outlined above   No unusual exposure hx or h/o childhood pna/ asthma or knowledge of premature birth.  Current Allergies, Complete Past Medical History, Past Surgical History, Family History, and Social History were reviewed in [facility] record.  ROS  The following are not active complaints unless bolded Hoarseness, sore throat, dysphagia, dental problems, itching, sneezing,  nasal congestion or discharge of excess mucus or purulent secretions, ear ache,   fever, chills, sweats, unintended wt loss or wt gain, classically pleuritic or exertional cp,  orthopnea pnd or arm/hand swelling  or leg swelling, presyncope, palpitations, abdominal pain, anorexia, nausea, vomiting, diarrhea  or change in bowel habits or change in bladder habits, change in stools or change in urine, dysuria, hematuria,  rash, arthralgias, visual  complaints, headache, numbness, weakness or ataxia or problems with walking or coordination,  change in mood or  memory.                Past Medical History:  Diagnosis Date  . Atrial fibrillation ([place])   . Kidney stone    resulting in stenting  . Type 2 diabetes mellitus ([place])   . Unspecified essential hypertension     Outpatient Medications Prior to Visit - - NOTE:   Unable to verify as accurately reflecting what pt takes     Medication Sig Dispense Refill  . alendronate (FOSAMAX) 70 MG tablet Take 70 mg by mouth once a week. Take with a full glass of water on an empty stomach.    Marland Kitchen amLODipine (NORVASC) 10 MG tablet Take 10 mg by mouth daily.    Marland Kitchen apixaban (ELIQUIS) 5 MG TABS tablet Take 1 tablet (5 mg total) by mouth 2 (two) times daily. 28 tablet 0  . atorvastatin (LIPITOR) 40 MG tablet Take 40 mg by mouth daily.    . carvedilol (COREG CR) 20 MG 24 hr capsule Take 20 mg by mouth daily.    Marland Kitchen diltiazem (CARDIZEM) 120 MG tablet Take 360 mg by mouth daily. Not sure if this is long or short acting.    . fluticasone (FLONASE) 50 MCG/ACT nasal spray Place 2 sprays into the nose daily.    Marland Kitchen glipiZIDE (GLUCOTROL) 10 MG tablet Take 10 mg by mouth daily before breakfast.    . hydrALAZINE (APRESOLINE) 25 MG tablet  Take 1 tablet (25 mg total) by mouth 3 (three) times daily. 90 tablet 6  . Liraglutide (VICTOZA [place]) Inject 1.8 mg into the skin daily.     Marland Kitchen loratadine (CLARITIN) 10 MG tablet Take 10 mg by mouth daily.    . metFORMIN (GLUCOPHAGE) 1000 MG tablet Take 1,000 mg by mouth 2 (two) times daily with a meal.    .     3      Objective:     BP (!) 150/90 (BP Location: Left Arm, Cuff Size: Normal)   Pulse 93   Temp (!) 97.1 F (36.2 C) (Temporal)   Ht 5\' 1"  (1.549 m)   Wt 152 lb (68.9 kg)   SpO2 98% Comment: on RA  BMI 28.72 kg/m   SpO2: 98 % (on RA)   Pleasant amb bf nad   HEENT : pt wearing mask not removed for exam due to covid -19 concerns.    NECK :  without  JVD/Nodes/TM/ nl carotid upstrokes bilaterally   LUNGS: no acc muscle use,  Nl contour chest which is clear to A and P bilaterally without cough on insp or exp maneuvers   CV:  RRR  no s3 or murmur - def  increase in P2 but no peripheral edema   ABD:  soft and nontender with nl inspiratory excursion in the supine position. No bruits or organomegaly appreciated, bowel sounds nl  MS:  Nl gait/ ext warm without deformities, calf tenderness, cyanosis or clubbing No obvious joint restrictions   SKIN: warm and dry without lesions    NEURO:  alert, approp, nl sensorium with  no motor or cerebellar deficits apparent.     I personally reviewed images and agree with radiology impression as follows:  CXR:   P and lateral 04/12/20 No active cardiopulmonary disease.     Assessment   No problem-specific Assessment & Plan notes found for this encounter.     Christinia Gully, MD 04/17/2020

## 2020-04-17 NOTE — Patient Instructions (Addendum)
I very strongly recommend you get the moderna or pfizer vaccine as soon as possible based on your risk of dying from the virus  and the proven safety and benefit of these vaccines against even the delta variant.  This can save your life as well as  those of your loved ones,  especially if they are also not vaccinated.      Please schedule a follow up office visit in 6 weeks to see Dr Halford Chessman to discuss further work up for your pulmonary hypertension Add:  ono on RA in meantime as she is willing to wear noct 02

## 2020-04-18 ENCOUNTER — Encounter: Payer: Self-pay | Admitting: Internal Medicine

## 2020-04-18 NOTE — Assessment & Plan Note (Signed)
Not adequately controlled on 3 different vasodilators - norvasc, apresoline, cardizem > doubt adherence and would strongly consider consolidating to just minoxidil  Plus adequate beta blockade but defer to cards with f/u planned

## 2020-04-18 NOTE — Assessment & Plan Note (Addendum)
   Cardiac tests:   Echo 10/20/19 >> EF 65 to 70%, severe LVH, PASP 80 mmHg, mod RA dilation, mild MR, mod/severe TR  Sleep tests:   PSG 02/04/20 >> AHI 16.1, SpO2 low 82%> refuses cpap    PFTs  04/10/20  wnl / pt denies limiting sob at present  I agree with Dr Halford Chessman that this degree of osa is unlikely to contribute this degree of PH and she refuses cpap anyway but she is willing to wear 02 and this may be of some help if qualifies by medicare guidelines so will set up study to determine this  and f/u/ with Dr Halford Chessman next available.   Next step is RHC esp needed in view of degree of diastolic dysfunction from HBP but defer this to cards/Dr Halford Chessman     Discussed in detail all the  indications, usual  risks and alternatives  relative to the benefits with patient who agrees to proceed with Rx as outlined.        Each maintenance medication was reviewed in detail including emphasizing most importantly the difference between maintenance and prns and under what circumstances the prns are to be triggered using an action plan format where appropriate.  Total time for H and P, chart review, counseling, teaching device and generating customized AVS unique to this office visit with pt new to me/ charting = 40 min         Cardiac tests:   Echo 10/20/19 >> EF 65 to 70%, severe LVH, PASP 80 mmHg, mod RA dilation, mild MR, mod/severe TR  Sleep tests:   PSG 02/04/20 >> AHI 16.1, SpO2 low 82%> refuses cpap    PFTs  04/10/20  wnl   I agree with Dr Halford Chessman that this degree of osa is unlikely to contribute this degree of PH and she refuses cpap anyway but she is willing to wear 02 and this may be of some help if qualifies by medicare guidelines so will set up study to determine this  and f/u/ with Dr Halford Chessman next available.   Discussed in detail all the  indications, usual  risks and alternatives  relative to the benefits with patient who agrees to proceed with Rx as outlined

## 2020-04-19 ENCOUNTER — Telehealth: Payer: Self-pay | Admitting: *Deleted

## 2020-04-19 DIAGNOSIS — I272 Pulmonary hypertension, unspecified: Secondary | ICD-10-CM

## 2020-04-19 NOTE — Telephone Encounter (Signed)
Called pt and there was no answer-LMTCB °

## 2020-04-19 NOTE — Telephone Encounter (Signed)
-----   Message from Tanda Rockers, MD sent at 04/18/2020  6:22 AM EDT ----- Needs ono on RA if not already done

## 2020-05-01 NOTE — Telephone Encounter (Signed)
Spoke with the pt and advised MW wants to order ONO  Pt was okay with this and test was ordered

## 2020-05-03 ENCOUNTER — Encounter: Payer: Self-pay | Admitting: Internal Medicine

## 2020-05-03 DIAGNOSIS — G4730 Sleep apnea, unspecified: Secondary | ICD-10-CM | POA: Diagnosis not present

## 2020-05-03 DIAGNOSIS — R0683 Snoring: Secondary | ICD-10-CM | POA: Diagnosis not present

## 2020-05-12 DIAGNOSIS — E1122 Type 2 diabetes mellitus with diabetic chronic kidney disease: Secondary | ICD-10-CM | POA: Diagnosis not present

## 2020-05-12 DIAGNOSIS — N182 Chronic kidney disease, stage 2 (mild): Secondary | ICD-10-CM | POA: Diagnosis not present

## 2020-05-12 DIAGNOSIS — I129 Hypertensive chronic kidney disease with stage 1 through stage 4 chronic kidney disease, or unspecified chronic kidney disease: Secondary | ICD-10-CM | POA: Diagnosis not present

## 2020-05-14 DIAGNOSIS — M8580 Other specified disorders of bone density and structure, unspecified site: Secondary | ICD-10-CM | POA: Diagnosis not present

## 2020-05-14 DIAGNOSIS — Z170 Estrogen receptor positive status [ER+]: Secondary | ICD-10-CM | POA: Diagnosis not present

## 2020-05-14 DIAGNOSIS — C50411 Malignant neoplasm of upper-outer quadrant of right female breast: Secondary | ICD-10-CM | POA: Diagnosis not present

## 2020-05-14 DIAGNOSIS — E673 Hypervitaminosis D: Secondary | ICD-10-CM | POA: Diagnosis not present

## 2020-05-14 DIAGNOSIS — Z780 Asymptomatic menopausal state: Secondary | ICD-10-CM | POA: Diagnosis not present

## 2020-05-16 DIAGNOSIS — Z780 Asymptomatic menopausal state: Secondary | ICD-10-CM | POA: Diagnosis not present

## 2020-05-16 DIAGNOSIS — Z79811 Long term (current) use of aromatase inhibitors: Secondary | ICD-10-CM | POA: Diagnosis not present

## 2020-05-16 DIAGNOSIS — K029 Dental caries, unspecified: Secondary | ICD-10-CM | POA: Diagnosis not present

## 2020-05-16 DIAGNOSIS — Z5181 Encounter for therapeutic drug level monitoring: Secondary | ICD-10-CM | POA: Diagnosis not present

## 2020-05-16 DIAGNOSIS — C50411 Malignant neoplasm of upper-outer quadrant of right female breast: Secondary | ICD-10-CM | POA: Diagnosis not present

## 2020-05-16 DIAGNOSIS — G4733 Obstructive sleep apnea (adult) (pediatric): Secondary | ICD-10-CM | POA: Diagnosis not present

## 2020-05-16 DIAGNOSIS — M8580 Other specified disorders of bone density and structure, unspecified site: Secondary | ICD-10-CM | POA: Diagnosis not present

## 2020-05-16 DIAGNOSIS — Z170 Estrogen receptor positive status [ER+]: Secondary | ICD-10-CM | POA: Diagnosis not present

## 2020-05-24 ENCOUNTER — Encounter: Payer: Self-pay | Admitting: Pulmonary Disease

## 2020-05-24 ENCOUNTER — Other Ambulatory Visit: Payer: Self-pay

## 2020-05-24 ENCOUNTER — Ambulatory Visit (INDEPENDENT_AMBULATORY_CARE_PROVIDER_SITE_OTHER): Payer: Medicare Other | Admitting: Pulmonary Disease

## 2020-05-24 VITALS — BP 158/96 | HR 77 | Temp 97.0°F | Ht 60.0 in | Wt 156.0 lb

## 2020-05-24 DIAGNOSIS — I272 Pulmonary hypertension, unspecified: Secondary | ICD-10-CM | POA: Diagnosis not present

## 2020-05-24 DIAGNOSIS — G4733 Obstructive sleep apnea (adult) (pediatric): Secondary | ICD-10-CM | POA: Diagnosis not present

## 2020-05-24 NOTE — Progress Notes (Signed)
[facility]  Chief Complaint  Patient presents with  . Follow-up    does not have cpap machine yet     Constitutional:  BP (!) 158/96 (BP Location: Left Arm, Cuff Size: Normal)   Pulse 77   Temp (!) 97 F (36.1 C) (Other (Comment)) Comment (Src): wrist  Ht 5' (1.524 m)   Wt 156 lb (70.8 kg)   SpO2 98% Comment: Room air  BMI 30.47 kg/m   Past Medical History:  Persistent A fib, Nephrolithiasis, DM 2, HTN, Rt breast cancer 2020  Past Surgical History:  Her  has a past surgical history that includes Tubal ligation; Tonsillectomy; and Adenoidectomy.  Brief Summary:  Betty Goodman is a 74 y.o. female never smoker with pulmonary hypertension.      Subjective:   She never had CPAP set up.  She got a nasal pillows mask and didn't think she could use this.  She wasn't shown any other types of masks.  Serology was negative.  PFT was normal    Physical Exam:   Appearance - well kempt   ENMT - no sinus tenderness, no oral exudate, no LAN, Mallampati 3 airway, no stridor, deviated nasal septum  Respiratory - equal breath sounds bilaterally, no wheezing or rales  CV - s1s2 regular rate and rhythm, no murmurs  Ext - no clubbing, no edema  Skin - no rashes  Psych - normal mood and affect   Pulmonary testing:   PFT 04/12/20 >> FEV1 2.01 (132%), FEV1% 83, TLC 5.07 (110%), DLCO 95%  Sleep Tests:   PSG 02/04/20 >> AHI 16.1, SpO2 low 82%  Cardiac Tests:   Echo 10/20/19 >> EF 65 to 70%, severe LVH, PASP 80 mmHg, mod RA dilation, mild MR, mod/severe TR  Social History:  She  reports that she has never smoked. She has never used smokeless tobacco. She reports that she does not drink alcohol.  Family History:  Her family history includes Cancer in her mother; Leukemia in her sister.     Assessment/Plan:   Pulmonary hypertension. - PFT normal and serology negative - at least in part related to sleep disordered  breathing  Obstructive sleep apnea. - she was not able to tolerate nasal pillows mask in setting of septal deviation and rhinitis - discussed that she could use full face mask  - she is willing to try CPAP - will arrange for auto CPAP set up and arrange for overnight oximetry on CPAP  Time Spent Involved in Patient Care on Day of Examination:  23 minutes  Follow up:  Patient Instructions  Will arrange for auto CPAP set up and overnight oxygen test on CPAP  Follow up in 3 months   Medication List:   Allergies as of 05/24/2020      Reactions   Sulfa Antibiotics       Medication List       Accurate as of May 24, 2020 11:05 AM. If you have any questions, ask your nurse or doctor.        STOP taking these medications   amLODipine 10 MG tablet Commonly known as: NORVASC Stopped by: Chesley Mires, MD     TAKE these medications   alendronate 70 MG tablet Commonly known as: FOSAMAX Take 70 mg by mouth once a week. Take with a full glass of water on an empty stomach.   apixaban 5 MG Tabs tablet Commonly known as: ELIQUIS Take 1 tablet (5 mg total)  by mouth 2 (two) times daily.   atorvastatin 40 MG tablet Commonly known as: LIPITOR Take 40 mg by mouth daily.   carvedilol 20 MG 24 hr capsule Commonly known as: COREG CR Take 20 mg by mouth daily.   chlorthalidone 25 MG tablet Commonly known as: HYGROTON Take 12.5 mg by mouth daily.   diltiazem 120 MG tablet Commonly known as: CARDIZEM Take 360 mg by mouth daily. Not sure if this is long or short acting.   fluticasone 50 MCG/ACT nasal spray Commonly known as: FLONASE Place 2 sprays into the nose daily.   glipiZIDE 10 MG tablet Commonly known as: GLUCOTROL Take 10 mg by mouth daily before breakfast.   hydrALAZINE 25 MG tablet Commonly known as: APRESOLINE Take 1 tablet (25 mg total) by mouth 3 (three) times daily.   loratadine 10 MG tablet Commonly known as: CLARITIN Take 10 mg by mouth daily.    losartan 100 MG tablet Commonly known as: COZAAR Take 100 mg by mouth daily.   metFORMIN 1000 MG tablet Commonly known as: GLUCOPHAGE Take 1,000 mg by mouth 2 (two) times daily with a meal.   VICTOZA [place] Inject 1.8 mg into the skin daily.       Signature:  Chesley Mires, MD [place] Pager - [phone] 05/24/2020, 11:05 AM

## 2020-05-24 NOTE — Patient Instructions (Signed)
Will arrange for auto CPAP set up and overnight oxygen test on CPAP  Follow up in 3 months

## 2020-06-28 DIAGNOSIS — N189 Chronic kidney disease, unspecified: Secondary | ICD-10-CM | POA: Diagnosis not present

## 2020-06-28 DIAGNOSIS — E1122 Type 2 diabetes mellitus with diabetic chronic kidney disease: Secondary | ICD-10-CM | POA: Diagnosis not present

## 2020-06-28 DIAGNOSIS — E782 Mixed hyperlipidemia: Secondary | ICD-10-CM | POA: Diagnosis not present

## 2020-06-28 DIAGNOSIS — E876 Hypokalemia: Secondary | ICD-10-CM | POA: Diagnosis not present

## 2020-06-28 DIAGNOSIS — I10 Essential (primary) hypertension: Secondary | ICD-10-CM | POA: Diagnosis not present

## 2020-06-28 DIAGNOSIS — E7801 Familial hypercholesterolemia: Secondary | ICD-10-CM | POA: Diagnosis not present

## 2020-07-18 ENCOUNTER — Telehealth: Payer: Self-pay | Admitting: Pulmonary Disease

## 2020-07-18 NOTE — Telephone Encounter (Signed)
Received a fax from [facility] stating that they have been unable to reach the patient for her ONO test. They have tried to call her on 11/12, 11/15, 11/23, 11/30, 12/2, 12/8, 12/10, 12/30 and 1/4.

## 2020-07-21 DIAGNOSIS — R300 Dysuria: Secondary | ICD-10-CM | POA: Diagnosis not present

## 2020-07-25 DIAGNOSIS — I482 Chronic atrial fibrillation, unspecified: Secondary | ICD-10-CM | POA: Diagnosis not present

## 2020-07-25 DIAGNOSIS — E782 Mixed hyperlipidemia: Secondary | ICD-10-CM | POA: Diagnosis not present

## 2020-07-25 DIAGNOSIS — E7849 Other hyperlipidemia: Secondary | ICD-10-CM | POA: Diagnosis not present

## 2020-07-25 DIAGNOSIS — C50911 Malignant neoplasm of unspecified site of right female breast: Secondary | ICD-10-CM | POA: Diagnosis not present

## 2020-07-25 DIAGNOSIS — I10 Essential (primary) hypertension: Secondary | ICD-10-CM | POA: Diagnosis not present

## 2020-07-25 DIAGNOSIS — Z6828 Body mass index (BMI) 28.0-28.9, adult: Secondary | ICD-10-CM | POA: Diagnosis not present

## 2020-07-25 DIAGNOSIS — E876 Hypokalemia: Secondary | ICD-10-CM | POA: Diagnosis not present

## 2020-07-25 DIAGNOSIS — E1122 Type 2 diabetes mellitus with diabetic chronic kidney disease: Secondary | ICD-10-CM | POA: Diagnosis not present

## 2020-07-26 DIAGNOSIS — G4733 Obstructive sleep apnea (adult) (pediatric): Secondary | ICD-10-CM | POA: Diagnosis not present

## 2020-07-26 DIAGNOSIS — M8580 Other specified disorders of bone density and structure, unspecified site: Secondary | ICD-10-CM | POA: Diagnosis not present

## 2020-07-26 DIAGNOSIS — Z79811 Long term (current) use of aromatase inhibitors: Secondary | ICD-10-CM | POA: Diagnosis not present

## 2020-07-26 DIAGNOSIS — C50411 Malignant neoplasm of upper-outer quadrant of right female breast: Secondary | ICD-10-CM | POA: Diagnosis not present

## 2020-07-26 DIAGNOSIS — Z170 Estrogen receptor positive status [ER+]: Secondary | ICD-10-CM | POA: Diagnosis not present

## 2020-07-26 DIAGNOSIS — Z780 Asymptomatic menopausal state: Secondary | ICD-10-CM | POA: Diagnosis not present

## 2020-07-26 DIAGNOSIS — Z5181 Encounter for therapeutic drug level monitoring: Secondary | ICD-10-CM | POA: Diagnosis not present

## 2020-07-26 DIAGNOSIS — M859 Disorder of bone density and structure, unspecified: Secondary | ICD-10-CM | POA: Diagnosis not present

## 2020-07-26 DIAGNOSIS — Z748 Other problems related to care provider dependency: Secondary | ICD-10-CM | POA: Diagnosis not present

## 2020-08-03 ENCOUNTER — Ambulatory Visit: Payer: Medicare Other | Admitting: Family Medicine

## 2020-08-15 DIAGNOSIS — M79671 Pain in right foot: Secondary | ICD-10-CM | POA: Diagnosis not present

## 2020-08-15 NOTE — Progress Notes (Deleted)
Cardiology Office Note  Date: 08/15/2020   ID: Betty Goodman, Betty Goodman 04/29/1946, MRN [id number]  PCP:  Jalene Mullet, PA-C  Cardiologist:  No primary care provider on file. Electrophysiologist:  None   Chief Complaint: Follow-up shortness of breath  History of Present Illness: Betty Goodman is a 75 y.o. female with a history of shortness of breath, hypertension, type 2 diabetes, breast cancer with partial right mastectomy 02/03/2019 and radiation.  Follows with oncology at [facility].  Last visit with Dr. Bronson Ing 09/16/2019.  She stated for the prior 3 months she had noticed she had to sit up in a recliner due to shortness of breath about 3 times a week.  She denied any leg swelling or exertional chest pain.  Recent echocardiogram showed EF of 65 to 70%, severe LVH of septal segment.  Severely elevated pulmonary artery systolic pressure, mild left atrial dilatation, right atrium moderately dilated, mild mitral valve regurgitation, moderate to severe tricuspid regurgitation.  Chest x-ray showed cardiomegaly. She has an order for sleep study July 17 at [facility] sleep disorder center Dr. Baird Lyons, MD.    Recent ED visit to [facility] for HTN on 12/21/2019. Diltiazem 30 mg given orally x 1 to help lower her blood pressure.  Initial blood pressure was 161/100   Patient states she had a recent visit with her PCP who stopped her amlodipine due to being on diltiazem.  Her blood pressure remains elevated today at 140/72.  She denies any chest pain, pressure, tightness, neck, arm, back, or jaw pain.  Denies any associated symptoms.  States she has 2 pillow orthopnea and sometimes has to sleep in a recliner.  States her blood pressures systolic have been running in the 448J to 856D systolic.   Past Medical History:  Diagnosis Date  . Atrial fibrillation ([place])   . Kidney stone    resulting in stenting  . Type 2 diabetes mellitus ([place])   . Unspecified essential hypertension     Past Surgical History:   Procedure Laterality Date  . ADENOIDECTOMY    . TONSILLECTOMY    . TUBAL LIGATION      Current Outpatient Medications  Medication Sig Dispense Refill  . alendronate (FOSAMAX) 70 MG tablet Take 70 mg by mouth once a week. Take with a full glass of water on an empty stomach.    Marland Kitchen apixaban (ELIQUIS) 5 MG TABS tablet Take 1 tablet (5 mg total) by mouth 2 (two) times daily. 28 tablet 0  . atorvastatin (LIPITOR) 40 MG tablet Take 40 mg by mouth daily.    . carvedilol (COREG CR) 20 MG 24 hr capsule Take 20 mg by mouth daily.    . chlorthalidone (HYGROTON) 25 MG tablet Take 12.5 mg by mouth daily.    Marland Kitchen diltiazem (CARDIZEM) 120 MG tablet Take 360 mg by mouth daily. Not sure if this is long or short acting.    . fluticasone (FLONASE) 50 MCG/ACT nasal spray Place 2 sprays into the nose daily.    Marland Kitchen glipiZIDE (GLUCOTROL) 10 MG tablet Take 10 mg by mouth daily before breakfast.    . hydrALAZINE (APRESOLINE) 25 MG tablet Take 1 tablet (25 mg total) by mouth 3 (three) times daily. 90 tablet 6  . Liraglutide (VICTOZA [place]) Inject 1.8 mg into the skin daily.     Marland Kitchen loratadine (CLARITIN) 10 MG tablet Take 10 mg by mouth daily.    Marland Kitchen losartan (COZAAR) 100 MG tablet Take 100 mg by mouth daily.    Marland Kitchen  metFORMIN (GLUCOPHAGE) 1000 MG tablet Take 1,000 mg by mouth 2 (two) times daily with a meal.     No current facility-administered medications for this visit.   Allergies:  Sulfa antibiotics   Social History: The patient  reports that she has never smoked. She has never used smokeless tobacco. She reports that she does not drink alcohol.   Family History: The patient's family history includes Cancer in her mother; Leukemia in her sister.   ROS:  Please see the history of present illness. Otherwise, complete review of systems is positive for none.  All other systems are reviewed and negative.   Physical Exam: VS:  There were no vitals taken for this visit., BMI There is no height or weight on file to calculate  BMI.  Wt Readings from Last 3 Encounters:  05/24/20 156 lb (70.8 kg)  04/17/20 152 lb (68.9 kg)  03/01/20 151 lb 4.8 oz (68.6 kg)    General: Patient appears comfortable at rest. Neck: Supple, no elevated JVP or carotid bruits, no thyromegaly. Lungs: Clear to auscultation, nonlabored breathing at rest. Cardiac: Regular rate and rhythm, no S3 or significant systolic murmur, no pericardial rub. Extremities: No pitting edema, distal pulses 2+. Skin: Warm and dry. Musculoskeletal: No kyphosis. Neuropsychiatric: Alert and oriented x3, affect grossly appropriate.  ECG:    Recent Labwork: 03/01/2020: ALT 26; AST 20; BUN 24; Creatinine, Ser 1.07; Hemoglobin 14.6; Platelets 232; Potassium 3.6; Sodium 142  No results found for: CHOL, TRIG, HDL, CHOLHDL, VLDL, LDLCALC, LDLDIRECT  Other Studies Reviewed Today:  Echocardiogram 10/20/2019  1. Left ventricular ejection fraction, by estimation, is 65 to 70%. The left ventricle has normal function. The left ventricle has no regional wall motion abnormalities. There is severe left ventricular hypertrophy of the septal segment. Left ventricular diastolic parameters are indeterminate. 2. Right ventricular systolic function is normal. The right ventricular size is mildly enlarged. There is severely elevated pulmonary artery systolic pressure. 3. Left atrial size was mildly dilated. 4. Right atrial size was moderately dilated. 5. The mitral valve is degenerative. Mild mitral valve regurgitation. 6. Tricuspid valve regurgitation is moderate to severe. 7. The aortic valve is tricuspid. Aortic valve regurgitation is not visualized. Mild aortic valve sclerosis is present, with no evidence of aortic valve stenosis. 8. The inferior vena cava is normal in size with greater than 50% respiratory variability, suggesting right atrial pressure of 3 mmHg. Comparison(s): Previous Echo showed LV EF 60-65%, mild MR, moderate TR, tri-leaflet aortic valve with mild  calcification. moderate LAE.  Echocardiogram 08/12/2018:  1. The left ventricle has normal systolic function of 65-03%. The cavity size is normal. There is no left ventricular wall thickness. Indeterminate diastolic filling patterns. The left ventricular diastology could not be evaluated secondary to atrial  fibrillation. 2. The mitral valve normal in structure. Regurgitation is mild by color flow Doppler. 3. Normal tricuspid valve. 4. Tricuspid regurgitation moderate. RVSP 32.5 mmHg. 5. The aortic root is normal is size and structure. 6. The aortic valve is tricuspid. There is mild calcification of the aortic valve. 7. Moderately dilated left atrial size. 8. Normal right atrial size. 9. No atrial level shunt detected by color flow Doppler.   Assessment and Plan:   1. Persistent atrial fibrillation (HCC) Heart rate today 45 and regular.  Continue Eliquis 5 mg p.o. twice daily.  Continue diltiazem 120 mg daily.  2. Essential hypertension Blood pressure 140/72 on arrival.  Discontinue amlodipine, continue Cardizem 120 mg daily.  Continue Coreg 20 mg daily.  Continue lisinopril HCTZ 20/12.5 mg p.o. twice daily.  Start hydralazine 25 mg p.o. 3 times daily  3. SOB (shortness of breath) Continues with mild to moderate shortness of breath.  Recent echo showed significantly elevated pulmonary artery systolic pressure at 80, with moderate severe tricuspid regurgitation.  Severe left ventricular hypertrophy of the septal segment on recent echo. Patient has a pending sleep study at [place] sleep disorder clinic on July 17  4. Moderate to severe pulmonary hypertension (HCC) Refer to [facility] pulmonary clinic for management of pulmonary artery hypertension.  Significantly increased PASP at 80 on recent echo.   Medication Adjustments/Labs and Tests Ordered: Current medicines are reviewed at length with the patient today.  Concerns regarding medicines are outlined above.    Disposition:  Follow-up with cardiology or APP 6 months Signed, Levell July, NP 08/15/2020 10:24 PM    [facility] at [place], [place], [place] [address] Phone: [phone]; Fax: [phone]

## 2020-08-16 ENCOUNTER — Ambulatory Visit: Payer: Medicare Other | Admitting: Family Medicine

## 2020-08-24 DIAGNOSIS — S92901A Unspecified fracture of right foot, initial encounter for closed fracture: Secondary | ICD-10-CM | POA: Diagnosis not present

## 2020-08-31 DIAGNOSIS — Z130 Encounter for screening for diseases of the blood and blood-forming organs and certain disorders involving the immune mechanism: Secondary | ICD-10-CM | POA: Diagnosis not present

## 2020-08-31 DIAGNOSIS — R6883 Chills (without fever): Secondary | ICD-10-CM | POA: Diagnosis not present

## 2020-09-05 NOTE — Progress Notes (Signed)
Cardiology Office Note  Date: 09/06/2020   ID: [name], [name] [birth year], MRN [id number]  PCP:  Jalene Mullet, PA-C  Cardiologist:  No primary care provider on file. Electrophysiologist:  None   Chief Complaint: Follow-up shortness of breath  History of Present Illness: Betty Goodman is a 75 y.o. female with a history of shortness of breath, hypertension, type 2 diabetes, breast cancer with partial right mastectomy 02/03/2019 and radiation.  Follows with oncology at [facility].  Last visit with Dr. Bronson Ing 09/16/2019.  She stated for the prior 3 months she had noticed she had to sit up in a recliner due to shortness of breath about 3 times a week.  She denied any leg swelling or exertional chest pain.  Recent echocardiogram showed EF of 65 to 70%, severe LVH of septal segment.  Severely elevated pulmonary artery systolic pressure, mild left atrial dilatation, right atrium moderately dilated, mild mitral valve regurgitation, moderate to severe tricuspid regurgitation.  Chest x-ray showed cardiomegaly. She had an order for sleep study July 17 at [facility] sleep disorder center Dr. Baird Lyons, MD.    Recent ED visit to [facility] for HTN on 12/21/2019. Diltiazem 30 mg given orally x 1 to help lower her blood pressure.  Initial blood pressure was 161/100  Patient states she had a recent visit with her PCP who stopped her amlodipine due to being on diltiazem.  Her blood pressure remained elevated at 140/72.  She denied any chest pain, pressure, tightness, neck, arm, back, or jaw pain.  Denied any associated symptoms.  States she had 2 pillow orthopnea and sometimes has to sleep in a recliner.  Apparently she is supposed to be on CPAP therapy but cannot tolerate.  She  stated her blood pressures systolic had been running in the 621H to 086V systolic.   She is here today for 57-month follow-up.  She states her shortness of breath seems to be increasing lately with activity which she describes is new  for her.  She denies any anginal symptoms, orthostatic symptoms, CVA or TIA-like symptoms, describes some orthopnea.  No bleeding issues, claudication-like symptoms, DVT or PE-like symptoms.  As noted above during recent visit with Dr. Melvyn Novas, he suggested she may need a right heart cath given degree of diastolic dysfunction and LVH.  Also mentioned that he doubted adherence to Norvasc, Apresoline and Cardizem.  Stated he would strongly consider consolidating to just minoxidil plus beta-blocker but defer to cards with follow-up plan.  Blood pressures well controlled today on current medications at 128/82.  She saw Dr. Halford Chessman in November 2021 with normal PFT and serology negative.  He believed her pulmonary hypertension was in part due to sleep disordered breathing.  He stated he was willing totry CPAP of full mask and overnight oximetry on CPAP.  On questioning her today she states she is not going to do the CPAP.    Past Medical History:  Diagnosis Date  . Atrial fibrillation ([place])   . Kidney stone    resulting in stenting  . Type 2 diabetes mellitus ([place])   . Unspecified essential hypertension     Past Surgical History:  Procedure Laterality Date  . ADENOIDECTOMY    . TONSILLECTOMY    . TUBAL LIGATION      Current Outpatient Medications  Medication Sig Dispense Refill  . alendronate (FOSAMAX) 70 MG tablet Take 70 mg by mouth once a week. Take with a full glass of water on an empty stomach.    Marland Kitchen  apixaban (ELIQUIS) 5 MG TABS tablet Take 1 tablet (5 mg total) by mouth 2 (two) times daily. 28 tablet 0  . atorvastatin (LIPITOR) 40 MG tablet Take 40 mg by mouth at bedtime.    . carvedilol (COREG CR) 20 MG 24 hr capsule Take 20 mg by mouth daily.    Marland Kitchen diltiazem (CARDIZEM CD) 360 MG 24 hr capsule Take 360 mg by mouth daily.    . Empagliflozin (JARDIANCE PO) Take 1 tablet by mouth at bedtime.    . fluticasone (FLONASE) 50 MCG/ACT nasal spray Place 2 sprays into the nose daily.    Marland Kitchen glipiZIDE  (GLUCOTROL) 10 MG tablet Take 10 mg by mouth daily before breakfast.    . letrozole (FEMARA) 2.5 MG tablet Take 2.5 mg by mouth daily.    . Liraglutide (VICTOZA [place]) Inject 1.8 mg into the skin daily.     Marland Kitchen loratadine (CLARITIN) 10 MG tablet Take 10 mg by mouth daily.    Marland Kitchen losartan (COZAAR) 100 MG tablet Take 100 mg by mouth daily.    . metFORMIN (GLUCOPHAGE) 1000 MG tablet Take 1,000 mg by mouth 2 (two) times daily with a meal.    . hydrALAZINE (APRESOLINE) 25 MG tablet Take 1 tablet (25 mg total) by mouth 3 (three) times daily. 90 tablet 6   No current facility-administered medications for this visit.   Allergies:  Sulfa antibiotics   Social History: The patient  reports that she has never smoked. She has never used smokeless tobacco. She reports that she does not drink alcohol.   Family History: The patient's family history includes Cancer in her mother; Leukemia in her sister.   ROS:  Please see the history of present illness. Otherwise, complete review of systems is positive for none.  All other systems are reviewed and negative.   Physical Exam: VS:  BP 128/82   Pulse 93   Ht 5' (1.524 m)   Wt 151 lb 6.4 oz (68.7 kg)   SpO2 98%   BMI 29.57 kg/m , BMI Body mass index is 29.57 kg/m.  Wt Readings from Last 3 Encounters:  09/06/20 151 lb 6.4 oz (68.7 kg)  05/24/20 156 lb (70.8 kg)  04/17/20 152 lb (68.9 kg)    General: Patient appears comfortable at rest. Neck: Supple, no elevated JVP or carotid bruits, no thyromegaly. Lungs: Clear to auscultation, nonlabored breathing at rest. Cardiac: Regular rate and rhythm, no S3 or significant systolic murmur, no pericardial rub. Extremities: No pitting edema, distal pulses 2+. Skin: Warm and dry. Musculoskeletal: No kyphosis. Neuropsychiatric: Alert and oriented x3, affect grossly appropriate.  ECG: September 06, 2020 atrial flutter with variable AV block rate of 90, left axis deviation, anteroseptal infarct age undetermined.  Recent  Labwork: 03/01/2020: ALT 26; AST 20; BUN 24; Creatinine, Ser 1.07; Hemoglobin 14.6; Platelets 232; Potassium 3.6; Sodium 142  No results found for: CHOL, TRIG, HDL, CHOLHDL, VLDL, LDLCALC, LDLDIRECT  Other Studies Reviewed Today:  Echocardiogram 10/20/2019  1. Left ventricular ejection fraction, by estimation, is 65 to 70%. The left ventricle has normal function. The left ventricle has no regional wall motion abnormalities. There is severe left ventricular hypertrophy of the septal segment. Left ventricular diastolic parameters are indeterminate. 2. Right ventricular systolic function is normal. The right ventricular size is mildly enlarged. There is severely elevated pulmonary artery systolic pressure. 3. Left atrial size was mildly dilated. 4. Right atrial size was moderately dilated. 5. The mitral valve is degenerative. Mild mitral valve regurgitation. 6. Tricuspid  valve regurgitation is moderate to severe. 7. The aortic valve is tricuspid. Aortic valve regurgitation is not visualized. Mild aortic valve sclerosis is present, with no evidence of aortic valve stenosis. 8. The inferior vena cava is normal in size with greater than 50% respiratory variability, suggesting right atrial pressure of 3 mmHg. Comparison(s): Previous Echo showed LV EF 60-65%, mild MR, moderate TR, tri-leaflet aortic valve with mild calcification. moderate LAE.  Echocardiogram 08/12/2018: 1. The left ventricle has normal systolic function of 16-10%. The cavity size is normal. There is no left ventricular wall thickness. Indeterminate diastolic filling patterns. The left ventricular diastology could not be evaluated secondary to atrial  fibrillation. 2. The mitral valve normal in structure. Regurgitation is mild by color flow Doppler. 3. Normal tricuspid valve. 4. Tricuspid regurgitation moderate. RVSP 32.5 mmHg. 5. The aortic root is normal is size and structure. 6. The aortic valve is tricuspid. There is  mild calcification of the aortic valve. 7. Moderately dilated left atrial size. 8. Normal right atrial size. 9. No atrial level shunt detected by color flow Doppler.   Assessment and Plan:   1. Persistent atrial fibrillation (HCC) EKG today shows atrial flutter with a rate of 90 with variable AV block, left axis deviation, anteroseptal infarct, age undetermined.  Continue Eliquis 5 mg p.o. twice daily.  Continue diltiazem 120 mg daily.  2. Essential hypertension Blood pressure 140/72 on arrival.  Discontinue amlodipine, continue Cardizem 120 mg daily.  Continue Coreg 20 mg daily.  Continue lisinopril HCTZ 20/12.5 mg p.o. twice daily.  Start hydralazine 25 mg p.o. 3 times daily.  Recently Dr. Melvyn Novas suggested discontinue amlodipine, Cardizem, hydralazine to be discontinued and to start minoxidil.  He believed her to be noncompliant with the 3 medication therapy  3. SOB (shortness of breath) Continues with mild to moderate shortness of breath.  Recent echo showed significantly elevated pulmonary artery systolic pressure at 80, with moderate severe tricuspid regurgitation.  Severe left ventricular hypertrophy of the septal segment on recent echo.  Recently saw Dr. Halford Chessman who recommended she start CPAP with a full facemask.  Apparently she initially agreed but states today she does not want to pursue CPAP.  4. Moderate to severe pulmonary hypertension (HCC) Refer to [facility] pulmonary clinic for management of pulmonary artery hypertension.  Significantly increased PASP at 80 on recent echo.  Recently saw Dr. Melvyn Novas who suggested she have a right heart cath due to significant diastolic dysfunction and severe LVH.  Will forward chart to Dr. Harl Bowie to get his assessment regarding going forward with a right heart cath per Dr. Gustavus Bryant suggestion.   Medication Adjustments/Labs and Tests Ordered: Current medicines are reviewed at length with the patient today.  Concerns regarding medicines are outlined  above.    Disposition: Follow-up with cardiology or APP 6 months Signed, Levell July, NP 09/06/2020 11:04 AM    [facility] at [place], [place], [place] [address] Phone: [phone]; Fax: [phone]

## 2020-09-06 ENCOUNTER — Encounter: Payer: Self-pay | Admitting: Family Medicine

## 2020-09-06 ENCOUNTER — Ambulatory Visit (INDEPENDENT_AMBULATORY_CARE_PROVIDER_SITE_OTHER): Payer: Medicare Other | Admitting: Family Medicine

## 2020-09-06 VITALS — BP 128/82 | HR 93 | Ht 60.0 in | Wt 151.4 lb

## 2020-09-06 DIAGNOSIS — I4891 Unspecified atrial fibrillation: Secondary | ICD-10-CM | POA: Diagnosis not present

## 2020-09-06 DIAGNOSIS — I10 Essential (primary) hypertension: Secondary | ICD-10-CM | POA: Diagnosis not present

## 2020-09-06 DIAGNOSIS — I272 Pulmonary hypertension, unspecified: Secondary | ICD-10-CM | POA: Diagnosis not present

## 2020-09-06 DIAGNOSIS — G4733 Obstructive sleep apnea (adult) (pediatric): Secondary | ICD-10-CM | POA: Diagnosis not present

## 2020-09-06 NOTE — Addendum Note (Signed)
Addended by: Laurine Blazer on: 09/06/2020 01:35 PM   Modules accepted: Orders

## 2020-09-06 NOTE — Patient Instructions (Signed)
Medication Instructions:  Continue all current medications.   Labwork: none  Testing/Procedures: none  Follow-Up: 6 months   Any Other Special Instructions Will Be Listed Below (If Applicable).   If you need a refill on your cardiac medications before your next appointment, please call your pharmacy.  

## 2020-09-10 DIAGNOSIS — E1122 Type 2 diabetes mellitus with diabetic chronic kidney disease: Secondary | ICD-10-CM | POA: Diagnosis not present

## 2020-09-10 DIAGNOSIS — I4819 Other persistent atrial fibrillation: Secondary | ICD-10-CM | POA: Diagnosis not present

## 2020-09-10 DIAGNOSIS — E7849 Other hyperlipidemia: Secondary | ICD-10-CM | POA: Diagnosis not present

## 2020-09-10 DIAGNOSIS — N182 Chronic kidney disease, stage 2 (mild): Secondary | ICD-10-CM | POA: Diagnosis not present

## 2020-09-10 DIAGNOSIS — I129 Hypertensive chronic kidney disease with stage 1 through stage 4 chronic kidney disease, or unspecified chronic kidney disease: Secondary | ICD-10-CM | POA: Diagnosis not present

## 2020-09-12 NOTE — Progress Notes (Signed)
I looked at the April 2021 study, I do not see how the PASP was calculated in the 80s. The highest I see is around 63 which is significantly elevated but not as severely as from the echo report. There are some signs of RV enlargement and dysfunction. I would repeat echo first, and then very likely plan for RHC. I have never seen this patient nor do I know her history of antihypertensives, I would continue current regimen for now and consider changes at f/u. The main issue likely is tid hydralazine, at f/u could look into an alternative such as chlorthalidone or aldactone if not tried before and no contraindications.     Zandra Abts MD

## 2020-10-18 ENCOUNTER — Telehealth: Payer: Self-pay | Admitting: *Deleted

## 2020-10-18 DIAGNOSIS — I272 Pulmonary hypertension, unspecified: Secondary | ICD-10-CM

## 2020-10-18 NOTE — Telephone Encounter (Signed)
-----   Message from Verta Ellen., NP sent at 10/17/2020 10:51 PM EDT ----- Regarding: Repeat echo Betty Goodman will you call the patient and let her know Dr Harl Bowie wants to have her get a repeat echocardiogram. She had elevated pulmonary pressures on her last echocardiogram. Please call her and let her know. Below is his note in response to my question regarding elevated pulmonary pressures. The measurement may not be accurate on previous echocardiogram but he wants to repeat echo to make sure. Thank you     I looked at the April 2021 study, I do not see how the PASP was calculated in the 80s. The highest I see is around 53 which is significantly elevated but not as severely as from the echo report. There are some signs of RV enlargement and dysfunction. I would repeat echo first, and then very likely plan for RHC. I have never seen this patient nor do I know her history of antihypertensives, I would continue current regimen for now and consider changes at f/u. The main issue likely is tid hydralazine, at f/u could look into an alternative such as chlorthalidone or aldactone if not tried before and no contraindications.     Betty Abts MD

## 2020-10-18 NOTE — Telephone Encounter (Signed)
Patient notified and verbalized understanding.  She is agreeable to doing the Echo - prefers to come to [place] office.

## 2020-11-06 ENCOUNTER — Ambulatory Visit (INDEPENDENT_AMBULATORY_CARE_PROVIDER_SITE_OTHER): Payer: Medicare Other

## 2020-11-06 DIAGNOSIS — I272 Pulmonary hypertension, unspecified: Secondary | ICD-10-CM | POA: Diagnosis not present

## 2020-11-06 LAB — ECHOCARDIOGRAM COMPLETE
AR max vel: 1.43 cm2
AV Area VTI: 1.49 cm2
AV Area mean vel: 1.35 cm2
AV Mean grad: 3.1 mmHg
AV Peak grad: 6 mmHg
Ao pk vel: 1.22 m/s
Area-P 1/2: 4.35 cm2
Calc EF: 50.3 %
MV M vel: 1.74 m/s
MV Peak grad: 12.1 mmHg
S' Lateral: 2.09 cm
Single Plane A2C EF: 48.4 %
Single Plane A4C EF: 50.1 %

## 2020-11-13 ENCOUNTER — Telehealth: Payer: Self-pay | Admitting: *Deleted

## 2020-11-13 NOTE — Telephone Encounter (Signed)
Laurine Blazer, LPN  01/15/3009 4:04 PM EDT Back to Top     Notified, copy to pcp.    Laurine Blazer, LPN  5/91/3685 9:92 PM EDT      Left message to return call.   Verta Ellen., NP  11/07/2020 9:45 AM EDT      Please call the patient and let her know the echocardiogram showed she has good pumping function of her heart. The main pumping chamber is significantly more muscular than normal. Best management is to keep BP at 130/80 or less consistently and manage all other risk factors. She has a very small leak in one of her valves on the left side and a medium leak in a valve on the right side. Her pulmonary pressure is significantly better when compared to the prior echo when it was measured at 80. This time it was 32. This is much better.

## 2020-11-14 DIAGNOSIS — Z79811 Long term (current) use of aromatase inhibitors: Secondary | ICD-10-CM | POA: Diagnosis not present

## 2020-11-14 DIAGNOSIS — Z170 Estrogen receptor positive status [ER+]: Secondary | ICD-10-CM | POA: Diagnosis not present

## 2020-11-14 DIAGNOSIS — M8580 Other specified disorders of bone density and structure, unspecified site: Secondary | ICD-10-CM | POA: Diagnosis not present

## 2020-11-14 DIAGNOSIS — L589 Radiodermatitis, unspecified: Secondary | ICD-10-CM | POA: Diagnosis not present

## 2020-11-14 DIAGNOSIS — Z780 Asymptomatic menopausal state: Secondary | ICD-10-CM | POA: Diagnosis not present

## 2020-11-14 DIAGNOSIS — K029 Dental caries, unspecified: Secondary | ICD-10-CM | POA: Diagnosis not present

## 2020-11-14 DIAGNOSIS — E673 Hypervitaminosis D: Secondary | ICD-10-CM | POA: Diagnosis not present

## 2020-11-14 DIAGNOSIS — G4733 Obstructive sleep apnea (adult) (pediatric): Secondary | ICD-10-CM | POA: Diagnosis not present

## 2020-11-14 DIAGNOSIS — C50411 Malignant neoplasm of upper-outer quadrant of right female breast: Secondary | ICD-10-CM | POA: Diagnosis not present

## 2020-11-14 DIAGNOSIS — Z5181 Encounter for therapeutic drug level monitoring: Secondary | ICD-10-CM | POA: Diagnosis not present

## 2020-11-16 DIAGNOSIS — E039 Hypothyroidism, unspecified: Secondary | ICD-10-CM | POA: Diagnosis not present

## 2020-11-16 DIAGNOSIS — E7849 Other hyperlipidemia: Secondary | ICD-10-CM | POA: Diagnosis not present

## 2020-11-16 DIAGNOSIS — E1165 Type 2 diabetes mellitus with hyperglycemia: Secondary | ICD-10-CM | POA: Diagnosis not present

## 2020-11-16 DIAGNOSIS — E782 Mixed hyperlipidemia: Secondary | ICD-10-CM | POA: Diagnosis not present

## 2020-11-16 DIAGNOSIS — N189 Chronic kidney disease, unspecified: Secondary | ICD-10-CM | POA: Diagnosis not present

## 2020-11-16 DIAGNOSIS — D649 Anemia, unspecified: Secondary | ICD-10-CM | POA: Diagnosis not present

## 2020-11-16 DIAGNOSIS — R739 Hyperglycemia, unspecified: Secondary | ICD-10-CM | POA: Diagnosis not present

## 2020-11-23 DIAGNOSIS — Z780 Asymptomatic menopausal state: Secondary | ICD-10-CM | POA: Diagnosis not present

## 2020-11-23 DIAGNOSIS — Z170 Estrogen receptor positive status [ER+]: Secondary | ICD-10-CM | POA: Diagnosis not present

## 2020-11-23 DIAGNOSIS — G4733 Obstructive sleep apnea (adult) (pediatric): Secondary | ICD-10-CM | POA: Diagnosis not present

## 2020-11-23 DIAGNOSIS — M8580 Other specified disorders of bone density and structure, unspecified site: Secondary | ICD-10-CM | POA: Diagnosis not present

## 2020-11-23 DIAGNOSIS — Z79811 Long term (current) use of aromatase inhibitors: Secondary | ICD-10-CM | POA: Diagnosis not present

## 2020-11-23 DIAGNOSIS — E673 Hypervitaminosis D: Secondary | ICD-10-CM | POA: Diagnosis not present

## 2020-11-23 DIAGNOSIS — K029 Dental caries, unspecified: Secondary | ICD-10-CM | POA: Diagnosis not present

## 2020-11-23 DIAGNOSIS — Z5181 Encounter for therapeutic drug level monitoring: Secondary | ICD-10-CM | POA: Diagnosis not present

## 2020-11-23 DIAGNOSIS — C50411 Malignant neoplasm of upper-outer quadrant of right female breast: Secondary | ICD-10-CM | POA: Diagnosis not present

## 2020-11-27 DIAGNOSIS — Z6828 Body mass index (BMI) 28.0-28.9, adult: Secondary | ICD-10-CM | POA: Diagnosis not present

## 2020-11-27 DIAGNOSIS — E7849 Other hyperlipidemia: Secondary | ICD-10-CM | POA: Diagnosis not present

## 2020-11-27 DIAGNOSIS — I10 Essential (primary) hypertension: Secondary | ICD-10-CM | POA: Diagnosis not present

## 2020-11-27 DIAGNOSIS — E1122 Type 2 diabetes mellitus with diabetic chronic kidney disease: Secondary | ICD-10-CM | POA: Diagnosis not present

## 2020-11-27 DIAGNOSIS — C50911 Malignant neoplasm of unspecified site of right female breast: Secondary | ICD-10-CM | POA: Diagnosis not present

## 2020-11-27 DIAGNOSIS — E876 Hypokalemia: Secondary | ICD-10-CM | POA: Diagnosis not present

## 2020-11-27 DIAGNOSIS — Z0001 Encounter for general adult medical examination with abnormal findings: Secondary | ICD-10-CM | POA: Diagnosis not present

## 2021-01-08 DIAGNOSIS — J0101 Acute recurrent maxillary sinusitis: Secondary | ICD-10-CM | POA: Diagnosis not present

## 2021-01-08 DIAGNOSIS — Z20828 Contact with and (suspected) exposure to other viral communicable diseases: Secondary | ICD-10-CM | POA: Diagnosis not present

## 2021-01-20 DIAGNOSIS — Z20822 Contact with and (suspected) exposure to covid-19: Secondary | ICD-10-CM | POA: Diagnosis not present

## 2021-02-13 DIAGNOSIS — N189 Chronic kidney disease, unspecified: Secondary | ICD-10-CM | POA: Diagnosis not present

## 2021-02-13 DIAGNOSIS — E7849 Other hyperlipidemia: Secondary | ICD-10-CM | POA: Diagnosis not present

## 2021-02-13 DIAGNOSIS — E875 Hyperkalemia: Secondary | ICD-10-CM | POA: Diagnosis not present

## 2021-02-13 DIAGNOSIS — E782 Mixed hyperlipidemia: Secondary | ICD-10-CM | POA: Diagnosis not present

## 2021-02-13 DIAGNOSIS — I10 Essential (primary) hypertension: Secondary | ICD-10-CM | POA: Diagnosis not present

## 2021-02-13 DIAGNOSIS — E1122 Type 2 diabetes mellitus with diabetic chronic kidney disease: Secondary | ICD-10-CM | POA: Diagnosis not present

## 2021-02-19 DIAGNOSIS — Z6826 Body mass index (BMI) 26.0-26.9, adult: Secondary | ICD-10-CM | POA: Diagnosis not present

## 2021-02-19 DIAGNOSIS — C50911 Malignant neoplasm of unspecified site of right female breast: Secondary | ICD-10-CM | POA: Diagnosis not present

## 2021-02-19 DIAGNOSIS — E1122 Type 2 diabetes mellitus with diabetic chronic kidney disease: Secondary | ICD-10-CM | POA: Diagnosis not present

## 2021-02-19 DIAGNOSIS — E876 Hypokalemia: Secondary | ICD-10-CM | POA: Diagnosis not present

## 2021-02-19 DIAGNOSIS — I10 Essential (primary) hypertension: Secondary | ICD-10-CM | POA: Diagnosis not present

## 2021-02-19 DIAGNOSIS — Z20828 Contact with and (suspected) exposure to other viral communicable diseases: Secondary | ICD-10-CM | POA: Diagnosis not present

## 2021-03-13 DIAGNOSIS — R922 Inconclusive mammogram: Secondary | ICD-10-CM | POA: Diagnosis not present

## 2021-03-13 DIAGNOSIS — Z7983 Long term (current) use of bisphosphonates: Secondary | ICD-10-CM | POA: Diagnosis not present

## 2021-03-13 DIAGNOSIS — Z7984 Long term (current) use of oral hypoglycemic drugs: Secondary | ICD-10-CM | POA: Diagnosis not present

## 2021-03-13 DIAGNOSIS — Z7901 Long term (current) use of anticoagulants: Secondary | ICD-10-CM | POA: Diagnosis not present

## 2021-03-13 DIAGNOSIS — Z170 Estrogen receptor positive status [ER+]: Secondary | ICD-10-CM | POA: Diagnosis not present

## 2021-03-13 DIAGNOSIS — C50411 Malignant neoplasm of upper-outer quadrant of right female breast: Secondary | ICD-10-CM | POA: Diagnosis not present

## 2021-03-13 DIAGNOSIS — Z853 Personal history of malignant neoplasm of breast: Secondary | ICD-10-CM | POA: Diagnosis not present

## 2021-03-13 DIAGNOSIS — E785 Hyperlipidemia, unspecified: Secondary | ICD-10-CM | POA: Diagnosis not present

## 2021-03-13 DIAGNOSIS — E1122 Type 2 diabetes mellitus with diabetic chronic kidney disease: Secondary | ICD-10-CM | POA: Diagnosis not present

## 2021-03-13 DIAGNOSIS — N189 Chronic kidney disease, unspecified: Secondary | ICD-10-CM | POA: Diagnosis not present

## 2021-03-13 DIAGNOSIS — Z882 Allergy status to sulfonamides status: Secondary | ICD-10-CM | POA: Diagnosis not present

## 2021-03-13 DIAGNOSIS — Z08 Encounter for follow-up examination after completed treatment for malignant neoplasm: Secondary | ICD-10-CM | POA: Diagnosis not present

## 2021-03-13 DIAGNOSIS — Z87891 Personal history of nicotine dependence: Secondary | ICD-10-CM | POA: Diagnosis not present

## 2021-03-13 DIAGNOSIS — Z780 Asymptomatic menopausal state: Secondary | ICD-10-CM | POA: Diagnosis not present

## 2021-03-13 DIAGNOSIS — I129 Hypertensive chronic kidney disease with stage 1 through stage 4 chronic kidney disease, or unspecified chronic kidney disease: Secondary | ICD-10-CM | POA: Diagnosis not present

## 2021-03-13 DIAGNOSIS — Z79899 Other long term (current) drug therapy: Secondary | ICD-10-CM | POA: Diagnosis not present

## 2021-03-13 DIAGNOSIS — Z9011 Acquired absence of right breast and nipple: Secondary | ICD-10-CM | POA: Diagnosis not present

## 2021-03-26 ENCOUNTER — Encounter: Payer: Self-pay | Admitting: Cardiology

## 2021-03-26 ENCOUNTER — Ambulatory Visit (INDEPENDENT_AMBULATORY_CARE_PROVIDER_SITE_OTHER): Payer: Medicare Other | Admitting: Cardiology

## 2021-03-26 VITALS — BP 144/79 | HR 57 | Ht 60.0 in | Wt 147.6 lb

## 2021-03-26 DIAGNOSIS — I4891 Unspecified atrial fibrillation: Secondary | ICD-10-CM

## 2021-03-26 DIAGNOSIS — I272 Pulmonary hypertension, unspecified: Secondary | ICD-10-CM

## 2021-03-26 DIAGNOSIS — I10 Essential (primary) hypertension: Secondary | ICD-10-CM

## 2021-03-26 NOTE — Patient Instructions (Addendum)
Medication Instructions:  Continue all current medications.   Labwork: none  Testing/Procedures: none  Follow-Up: 6 months   Any Other Special Instructions Will Be Listed Below (If Applicable).   If you need a refill on your cardiac medications before your next appointment, please call your pharmacy.  

## 2021-03-26 NOTE — Progress Notes (Signed)
Clinical Summary Betty Goodman is a 76 y.o.female former patient of Dr Bronson Ing, this is our first visit together. She is seen for the following medical problems.  Afib - infrequent palpitations - mixed compliant with meds   2. HTN - mixed compliance with meds   3.Breast cancer   4.Pulmonary HTN - April 2021 echo reported PASP in the 80s. On review unclear of that calculation, highest in the 52s.  - 10/2020 echo LVEF 38-75%, indet diastolic, normal RV, severe BAE, mod TR, PASP is 32 - severe BAE would suggest significant diastolic dysfunction> she also has OSA - PFTs were unremarkable, negative serologies.  - no SOB/DOE  5.OSA - has not wanted to wear cpap   Social history: She is originally from [place], [place].  She is widowed.  Her husband passed away in 10/19/17.  She has an adult daughter, Betty Goodman, who lives in the [place] area. She has another daughter in [place] in the [place]. area. She has 2 sons in [place]. She raised herself since the age of 53 initially in [place], [place].    Past Medical History:  Diagnosis Date   Atrial fibrillation ([place])    Kidney stone    resulting in stenting   Type 2 diabetes mellitus (HCC)    Unspecified essential hypertension      Allergies  Allergen Reactions   Sulfa Antibiotics      Current Outpatient Medications  Medication Sig Dispense Refill   alendronate (FOSAMAX) 70 MG tablet Take 70 mg by mouth once a week. Take with a full glass of water on an empty stomach.     apixaban (ELIQUIS) 5 MG TABS tablet Take 1 tablet (5 mg total) by mouth 2 (two) times daily. 28 tablet 0   atorvastatin (LIPITOR) 40 MG tablet Take 40 mg by mouth at bedtime.     carvedilol (COREG CR) 20 MG 24 hr capsule Take 20 mg by mouth daily.     diltiazem (CARDIZEM CD) 360 MG 24 hr capsule Take 360 mg by mouth daily.     Empagliflozin (JARDIANCE PO) Take 1 tablet by mouth at bedtime.     fluticasone (FLONASE) 50 MCG/ACT nasal spray Place 2 sprays into  the nose daily.     glipiZIDE (GLUCOTROL) 10 MG tablet Take 10 mg by mouth daily before breakfast.     hydrALAZINE (APRESOLINE) 25 MG tablet Take 1 tablet (25 mg total) by mouth 3 (three) times daily. 90 tablet 6   letrozole (FEMARA) 2.5 MG tablet Take 2.5 mg by mouth daily.     Liraglutide (VICTOZA [place]) Inject 1.8 mg into the skin daily.      loratadine (CLARITIN) 10 MG tablet Take 10 mg by mouth daily.     losartan (COZAAR) 100 MG tablet Take 100 mg by mouth daily.     metFORMIN (GLUCOPHAGE) 1000 MG tablet Take 1,000 mg by mouth 2 (two) times daily with a meal.     No current facility-administered medications for this visit.     Past Surgical History:  Procedure Laterality Date   ADENOIDECTOMY     TONSILLECTOMY     TUBAL LIGATION       Allergies  Allergen Reactions   Sulfa Antibiotics       Family History  Problem Relation Age of Onset   Cancer Mother        Liver   Leukemia Sister      Social History Ms. Furnari reports that she has never smoked.  She has never used smokeless tobacco. Ms. Rubano reports no history of alcohol use.   Review of Systems CONSTITUTIONAL: No weight loss, fever, chills, weakness or fatigue.  HEENT: Eyes: No visual loss, blurred vision, double vision or yellow sclerae.No hearing loss, sneezing, congestion, runny nose or sore throat.  SKIN: No rash or itching.  CARDIOVASCULAR: per hpi RESPIRATORY: No shortness of breath, cough or sputum.  GASTROINTESTINAL: No anorexia, nausea, vomiting or diarrhea. No abdominal pain or blood.  GENITOURINARY: No burning on urination, no polyuria NEUROLOGICAL: No headache, dizziness, syncope, paralysis, ataxia, numbness or tingling in the extremities. No change in bowel or bladder control.  MUSCULOSKELETAL: No muscle, back pain, joint pain or stiffness.  LYMPHATICS: No enlarged nodes. No history of splenectomy.  PSYCHIATRIC: No history of depression or anxiety.  ENDOCRINOLOGIC: No reports of sweating, cold or  heat intolerance. No polyuria or polydipsia.  Marland Kitchen   Physical Examination Today's Vitals   03/26/21 1129  BP: (!) 144/79  Pulse: (!) 57  SpO2: 97%  Weight: 147 lb 9.6 oz (67 kg)  Height: 5' (1.524 m)   Body mass index is 28.83 kg/m.  Gen: resting comfortably, no acute distress HEENT: no scleral icterus, pupils equal round and reactive, no palptable cervical adenopathy,  CV: irreg, no m/r/g no jvd Resp: Clear to auscultation bilaterally GI: abdomen is soft, non-tender, non-distended, normal bowel sounds, no hepatosplenomegaly MSK: extremities are warm, no edema.  Skin: warm, no rash Neuro:  no focal deficits Psych: appropriate affect   Diagnostic Studies 10/2020 echo IMPRESSIONS     1. Left ventricular ejection fraction, by estimation, is 60 to 65%. The  left ventricle has normal function. The left ventricle has no regional  wall motion abnormalities. There is severe left ventricular hypertrophy.  Left ventricular diastolic parameters   are indeterminate.   2. Right ventricular systolic function is normal. The right ventricular  size is normal.   3. Left atrial size was severely dilated.   4. Right atrial size was severely dilated.   5. The mitral valve is normal in structure. Trivial mitral valve  regurgitation. No evidence of mitral stenosis.   6. The tricuspid valve is abnormal. Tricuspid valve regurgitation is  moderate.   7. The aortic valve is tricuspid. There is mild calcification of the  aortic valve. There is mild thickening of the aortic valve. Aortic valve  regurgitation is not visualized. No aortic stenosis is present.   8. Mild pulmonary HTN, PASP is 32 mmHg.   9. The inferior vena cava is normal in size with greater than 50%  respiratory variability, suggesting right atrial pressure of 3 mmHg.    Assessment and Plan   Afib -no symptosm, continue current meds  2. HTN - elevated today, mixed medication compliance. COntinue to monitor  3. Pulmonary  HTN - reviwed echos as reported above, most recent study mild pulm HTN likely secondary to diastolic dysfunction and OSA. No strong indication for more involved testing at this time, monitor PASP over time       Arnoldo Lenis, M.D.

## 2021-05-02 DIAGNOSIS — R300 Dysuria: Secondary | ICD-10-CM | POA: Diagnosis not present

## 2021-05-06 DIAGNOSIS — N390 Urinary tract infection, site not specified: Secondary | ICD-10-CM | POA: Diagnosis not present

## 2021-05-10 DIAGNOSIS — M545 Low back pain, unspecified: Secondary | ICD-10-CM | POA: Diagnosis not present

## 2021-05-10 DIAGNOSIS — M25552 Pain in left hip: Secondary | ICD-10-CM | POA: Diagnosis not present

## 2021-05-10 DIAGNOSIS — Z6827 Body mass index (BMI) 27.0-27.9, adult: Secondary | ICD-10-CM | POA: Diagnosis not present

## 2021-05-16 DIAGNOSIS — Z79811 Long term (current) use of aromatase inhibitors: Secondary | ICD-10-CM | POA: Diagnosis not present

## 2021-05-16 DIAGNOSIS — M810 Age-related osteoporosis without current pathological fracture: Secondary | ICD-10-CM | POA: Diagnosis not present

## 2021-05-16 DIAGNOSIS — C50411 Malignant neoplasm of upper-outer quadrant of right female breast: Secondary | ICD-10-CM | POA: Diagnosis not present

## 2021-05-16 DIAGNOSIS — Z853 Personal history of malignant neoplasm of breast: Secondary | ICD-10-CM | POA: Diagnosis not present

## 2021-05-16 DIAGNOSIS — Z08 Encounter for follow-up examination after completed treatment for malignant neoplasm: Secondary | ICD-10-CM | POA: Diagnosis not present

## 2021-05-16 DIAGNOSIS — Z5181 Encounter for therapeutic drug level monitoring: Secondary | ICD-10-CM | POA: Diagnosis not present

## 2021-05-16 DIAGNOSIS — G4733 Obstructive sleep apnea (adult) (pediatric): Secondary | ICD-10-CM | POA: Diagnosis not present

## 2021-05-16 DIAGNOSIS — I10 Essential (primary) hypertension: Secondary | ICD-10-CM | POA: Diagnosis not present

## 2021-05-16 DIAGNOSIS — Z170 Estrogen receptor positive status [ER+]: Secondary | ICD-10-CM | POA: Diagnosis not present

## 2021-05-16 DIAGNOSIS — Z780 Asymptomatic menopausal state: Secondary | ICD-10-CM | POA: Diagnosis not present

## 2021-05-16 DIAGNOSIS — M8580 Other specified disorders of bone density and structure, unspecified site: Secondary | ICD-10-CM | POA: Diagnosis not present

## 2021-05-16 DIAGNOSIS — E673 Hypervitaminosis D: Secondary | ICD-10-CM | POA: Diagnosis not present

## 2021-05-16 DIAGNOSIS — I4891 Unspecified atrial fibrillation: Secondary | ICD-10-CM | POA: Diagnosis not present

## 2021-05-16 DIAGNOSIS — K029 Dental caries, unspecified: Secondary | ICD-10-CM | POA: Diagnosis not present

## 2021-05-23 DIAGNOSIS — E113291 Type 2 diabetes mellitus with mild nonproliferative diabetic retinopathy without macular edema, right eye: Secondary | ICD-10-CM | POA: Diagnosis not present

## 2021-05-23 DIAGNOSIS — E113312 Type 2 diabetes mellitus with moderate nonproliferative diabetic retinopathy with macular edema, left eye: Secondary | ICD-10-CM | POA: Diagnosis not present

## 2021-05-23 DIAGNOSIS — H401111 Primary open-angle glaucoma, right eye, mild stage: Secondary | ICD-10-CM | POA: Diagnosis not present

## 2021-05-23 DIAGNOSIS — H401122 Primary open-angle glaucoma, left eye, moderate stage: Secondary | ICD-10-CM | POA: Diagnosis not present

## 2021-05-27 ENCOUNTER — Encounter (INDEPENDENT_AMBULATORY_CARE_PROVIDER_SITE_OTHER): Payer: Medicare Other | Admitting: Ophthalmology

## 2021-05-27 ENCOUNTER — Other Ambulatory Visit: Payer: Self-pay

## 2021-05-27 DIAGNOSIS — H35033 Hypertensive retinopathy, bilateral: Secondary | ICD-10-CM | POA: Diagnosis not present

## 2021-05-27 DIAGNOSIS — I10 Essential (primary) hypertension: Secondary | ICD-10-CM

## 2021-05-27 DIAGNOSIS — E113313 Type 2 diabetes mellitus with moderate nonproliferative diabetic retinopathy with macular edema, bilateral: Secondary | ICD-10-CM

## 2021-05-27 DIAGNOSIS — H43813 Vitreous degeneration, bilateral: Secondary | ICD-10-CM | POA: Diagnosis not present

## 2021-06-03 DIAGNOSIS — E782 Mixed hyperlipidemia: Secondary | ICD-10-CM | POA: Diagnosis not present

## 2021-06-03 DIAGNOSIS — N189 Chronic kidney disease, unspecified: Secondary | ICD-10-CM | POA: Diagnosis not present

## 2021-06-03 DIAGNOSIS — I10 Essential (primary) hypertension: Secondary | ICD-10-CM | POA: Diagnosis not present

## 2021-06-03 DIAGNOSIS — E7849 Other hyperlipidemia: Secondary | ICD-10-CM | POA: Diagnosis not present

## 2021-06-03 DIAGNOSIS — E7800 Pure hypercholesterolemia, unspecified: Secondary | ICD-10-CM | POA: Diagnosis not present

## 2021-06-03 DIAGNOSIS — E1122 Type 2 diabetes mellitus with diabetic chronic kidney disease: Secondary | ICD-10-CM | POA: Diagnosis not present

## 2021-06-03 DIAGNOSIS — E1142 Type 2 diabetes mellitus with diabetic polyneuropathy: Secondary | ICD-10-CM | POA: Diagnosis not present

## 2021-06-03 DIAGNOSIS — E1165 Type 2 diabetes mellitus with hyperglycemia: Secondary | ICD-10-CM | POA: Diagnosis not present

## 2021-06-05 DIAGNOSIS — E7849 Other hyperlipidemia: Secondary | ICD-10-CM | POA: Diagnosis not present

## 2021-06-05 DIAGNOSIS — I10 Essential (primary) hypertension: Secondary | ICD-10-CM | POA: Diagnosis not present

## 2021-06-05 DIAGNOSIS — E876 Hypokalemia: Secondary | ICD-10-CM | POA: Diagnosis not present

## 2021-06-05 DIAGNOSIS — C50911 Malignant neoplasm of unspecified site of right female breast: Secondary | ICD-10-CM | POA: Diagnosis not present

## 2021-06-05 DIAGNOSIS — E1122 Type 2 diabetes mellitus with diabetic chronic kidney disease: Secondary | ICD-10-CM | POA: Diagnosis not present

## 2021-06-19 DIAGNOSIS — H34832 Tributary (branch) retinal vein occlusion, left eye, with macular edema: Secondary | ICD-10-CM | POA: Diagnosis not present

## 2021-06-19 DIAGNOSIS — H401122 Primary open-angle glaucoma, left eye, moderate stage: Secondary | ICD-10-CM | POA: Diagnosis not present

## 2021-06-19 DIAGNOSIS — H401111 Primary open-angle glaucoma, right eye, mild stage: Secondary | ICD-10-CM | POA: Diagnosis not present

## 2021-06-19 DIAGNOSIS — E113312 Type 2 diabetes mellitus with moderate nonproliferative diabetic retinopathy with macular edema, left eye: Secondary | ICD-10-CM | POA: Diagnosis not present

## 2021-06-20 DIAGNOSIS — Z20822 Contact with and (suspected) exposure to covid-19: Secondary | ICD-10-CM | POA: Diagnosis not present

## 2021-06-21 DIAGNOSIS — S61211A Laceration without foreign body of left index finger without damage to nail, initial encounter: Secondary | ICD-10-CM | POA: Diagnosis not present

## 2021-06-21 DIAGNOSIS — J0101 Acute recurrent maxillary sinusitis: Secondary | ICD-10-CM | POA: Diagnosis not present

## 2021-06-25 ENCOUNTER — Encounter (INDEPENDENT_AMBULATORY_CARE_PROVIDER_SITE_OTHER): Payer: Medicare Other | Admitting: Ophthalmology

## 2021-07-22 ENCOUNTER — Encounter (INDEPENDENT_AMBULATORY_CARE_PROVIDER_SITE_OTHER): Payer: Medicare Other | Admitting: Ophthalmology

## 2021-07-26 DIAGNOSIS — M542 Cervicalgia: Secondary | ICD-10-CM | POA: Diagnosis not present

## 2021-08-01 DIAGNOSIS — M5030 Other cervical disc degeneration, unspecified cervical region: Secondary | ICD-10-CM | POA: Diagnosis not present

## 2021-08-01 DIAGNOSIS — J329 Chronic sinusitis, unspecified: Secondary | ICD-10-CM | POA: Diagnosis not present

## 2021-08-01 DIAGNOSIS — R52 Pain, unspecified: Secondary | ICD-10-CM | POA: Diagnosis not present

## 2021-08-01 DIAGNOSIS — M542 Cervicalgia: Secondary | ICD-10-CM | POA: Diagnosis not present

## 2021-08-06 DIAGNOSIS — M5030 Other cervical disc degeneration, unspecified cervical region: Secondary | ICD-10-CM | POA: Diagnosis not present

## 2021-08-06 DIAGNOSIS — M2578 Osteophyte, vertebrae: Secondary | ICD-10-CM | POA: Diagnosis not present

## 2021-08-06 DIAGNOSIS — M542 Cervicalgia: Secondary | ICD-10-CM | POA: Diagnosis not present

## 2021-08-12 DIAGNOSIS — H401122 Primary open-angle glaucoma, left eye, moderate stage: Secondary | ICD-10-CM | POA: Diagnosis not present

## 2021-08-15 DIAGNOSIS — M5090 Cervical disc disorder, unspecified, unspecified cervical region: Secondary | ICD-10-CM | POA: Diagnosis not present

## 2021-08-15 DIAGNOSIS — M47812 Spondylosis without myelopathy or radiculopathy, cervical region: Secondary | ICD-10-CM | POA: Diagnosis not present

## 2021-08-16 DIAGNOSIS — L97509 Non-pressure chronic ulcer of other part of unspecified foot with unspecified severity: Secondary | ICD-10-CM | POA: Diagnosis not present

## 2021-08-16 DIAGNOSIS — E11621 Type 2 diabetes mellitus with foot ulcer: Secondary | ICD-10-CM | POA: Diagnosis not present

## 2021-08-26 DIAGNOSIS — Z20822 Contact with and (suspected) exposure to covid-19: Secondary | ICD-10-CM | POA: Diagnosis not present

## 2021-08-30 DIAGNOSIS — E11621 Type 2 diabetes mellitus with foot ulcer: Secondary | ICD-10-CM | POA: Diagnosis not present

## 2021-09-04 DIAGNOSIS — H401111 Primary open-angle glaucoma, right eye, mild stage: Secondary | ICD-10-CM | POA: Diagnosis not present

## 2021-09-11 DIAGNOSIS — E782 Mixed hyperlipidemia: Secondary | ICD-10-CM | POA: Diagnosis not present

## 2021-09-11 DIAGNOSIS — E7801 Familial hypercholesterolemia: Secondary | ICD-10-CM | POA: Diagnosis not present

## 2021-09-11 DIAGNOSIS — E7849 Other hyperlipidemia: Secondary | ICD-10-CM | POA: Diagnosis not present

## 2021-09-11 DIAGNOSIS — E1165 Type 2 diabetes mellitus with hyperglycemia: Secondary | ICD-10-CM | POA: Diagnosis not present

## 2021-09-11 DIAGNOSIS — N189 Chronic kidney disease, unspecified: Secondary | ICD-10-CM | POA: Diagnosis not present

## 2021-09-11 DIAGNOSIS — I10 Essential (primary) hypertension: Secondary | ICD-10-CM | POA: Diagnosis not present

## 2021-09-18 DIAGNOSIS — E876 Hypokalemia: Secondary | ICD-10-CM | POA: Diagnosis not present

## 2021-09-18 DIAGNOSIS — E1122 Type 2 diabetes mellitus with diabetic chronic kidney disease: Secondary | ICD-10-CM | POA: Diagnosis not present

## 2021-09-18 DIAGNOSIS — E7849 Other hyperlipidemia: Secondary | ICD-10-CM | POA: Diagnosis not present

## 2021-09-18 DIAGNOSIS — I10 Essential (primary) hypertension: Secondary | ICD-10-CM | POA: Diagnosis not present

## 2021-09-18 DIAGNOSIS — C50911 Malignant neoplasm of unspecified site of right female breast: Secondary | ICD-10-CM | POA: Diagnosis not present

## 2021-09-18 DIAGNOSIS — Z6825 Body mass index (BMI) 25.0-25.9, adult: Secondary | ICD-10-CM | POA: Diagnosis not present

## 2021-09-18 DIAGNOSIS — Z0001 Encounter for general adult medical examination with abnormal findings: Secondary | ICD-10-CM | POA: Diagnosis not present

## 2021-09-27 DIAGNOSIS — H401122 Primary open-angle glaucoma, left eye, moderate stage: Secondary | ICD-10-CM | POA: Diagnosis not present

## 2021-09-27 DIAGNOSIS — R1032 Left lower quadrant pain: Secondary | ICD-10-CM | POA: Diagnosis not present

## 2021-09-27 DIAGNOSIS — Z6825 Body mass index (BMI) 25.0-25.9, adult: Secondary | ICD-10-CM | POA: Diagnosis not present

## 2021-09-27 DIAGNOSIS — K5792 Diverticulitis of intestine, part unspecified, without perforation or abscess without bleeding: Secondary | ICD-10-CM | POA: Diagnosis not present

## 2021-09-27 DIAGNOSIS — H401111 Primary open-angle glaucoma, right eye, mild stage: Secondary | ICD-10-CM | POA: Diagnosis not present

## 2021-10-02 ENCOUNTER — Encounter: Payer: Self-pay | Admitting: Cardiology

## 2021-10-02 ENCOUNTER — Other Ambulatory Visit: Payer: Self-pay

## 2021-10-02 ENCOUNTER — Ambulatory Visit (INDEPENDENT_AMBULATORY_CARE_PROVIDER_SITE_OTHER): Payer: Medicare Other | Admitting: Cardiology

## 2021-10-02 VITALS — BP 140/90 | HR 64 | Ht 60.0 in | Wt 141.6 lb

## 2021-10-02 DIAGNOSIS — I4891 Unspecified atrial fibrillation: Secondary | ICD-10-CM | POA: Diagnosis not present

## 2021-10-02 DIAGNOSIS — I10 Essential (primary) hypertension: Secondary | ICD-10-CM | POA: Diagnosis not present

## 2021-10-02 DIAGNOSIS — D6869 Other thrombophilia: Secondary | ICD-10-CM

## 2021-10-02 DIAGNOSIS — I272 Pulmonary hypertension, unspecified: Secondary | ICD-10-CM | POA: Diagnosis not present

## 2021-10-02 NOTE — Patient Instructions (Signed)
Medication Instructions:  ?Your physician recommends that you continue on your current medications as directed. Please refer to the Current Medication list given to you today.  ? ?Labwork: ?none ? ?Testing/Procedures: ?none ? ?Follow-Up: ?Your physician recommends that you schedule a follow-up appointment in: 6 months ? ?Any Other Special Instructions Will Be Listed Below (If Applicable). ? ?Please call the office on Friday with your home blood pressure readings. ? ?If you need a refill on your cardiac medications before your next appointment, please call your pharmacy. ? ?

## 2021-10-02 NOTE — Progress Notes (Signed)
? ? ? ?Clinical Summary ?Ms. Glasner is a 76 y.o.female ? ?Afib ?- rare palpitaitons ?- mixed compliance with meds, sometimes forgets ?- no bleeding on eliquis.  ?  ?  ?2. HTN ?- mixed compliance with meds, sometimes forgets to take ?  ? ? ?3.Breast cancer ?  ?  ?4.Pulmonary HTN ?- April 2021 echo reported PASP in the 80s. On review unclear of that calculation, highest in the 62s.  ?- 10/2020 echo LVEF 73-22%, indet diastolic, normal RV, severe BAE, mod TR, PASP is 32 ?- severe BAE would suggest significant diastolic dysfunction> she also has OSA ?- PFTs were unremarkable, negative serologies. ?  ?- denies SOB/DOE ?  ?5.OSA ?- has not wanted to wear cpap ?  ?  ?Social history: She is originally from [place], [place].  She is widowed.  Her husband passed away in Oct 22, 2017.  She has an adult daughter, Tilda Burrow, who lives in the [place] area. She has another daughter in [place] in the [place]. area. She has 2 sons in [place]. She raised herself since the age of 5 initially in [place], [place].  ?Past Medical History:  ?Diagnosis Date  ? Atrial fibrillation ([place])   ? Kidney stone   ? resulting in stenting  ? Type 2 diabetes mellitus ([place])   ? Unspecified essential hypertension   ? ? ? ?Allergies  ?Allergen Reactions  ? Sulfa Antibiotics   ? ? ? ?Current Outpatient Medications  ?Medication Sig Dispense Refill  ? alendronate (FOSAMAX) 70 MG tablet Take 70 mg by mouth once a week. Take with a full glass of water on an empty stomach.    ? apixaban (ELIQUIS) 5 MG TABS tablet Take 1 tablet (5 mg total) by mouth 2 (two) times daily. 28 tablet 0  ? atorvastatin (LIPITOR) 40 MG tablet Take 40 mg by mouth at bedtime.    ? carvedilol (COREG CR) 20 MG 24 hr capsule Take 20 mg by mouth daily.    ? diltiazem (CARDIZEM CD) 360 MG 24 hr capsule Take 360 mg by mouth daily.    ? Empagliflozin (JARDIANCE PO) Take 1 tablet by mouth at bedtime.    ? fluticasone (FLONASE) 50 MCG/ACT nasal spray Place 2 sprays into the nose daily.    ? glipiZIDE  (GLUCOTROL) 10 MG tablet Take 10 mg by mouth daily before breakfast.    ? letrozole (FEMARA) 2.5 MG tablet Take 2.5 mg by mouth daily.    ? Liraglutide (VICTOZA [place]) Inject 1.8 mg into the skin daily.     ? loratadine (CLARITIN) 10 MG tablet Take 10 mg by mouth daily.    ? losartan (COZAAR) 100 MG tablet Take 100 mg by mouth daily.    ? metFORMIN (GLUCOPHAGE) 1000 MG tablet Take 1,000 mg by mouth 2 (two) times daily with a meal.    ? ?No current facility-administered medications for this visit.  ? ? ? ?Past Surgical History:  ?Procedure Laterality Date  ? ADENOIDECTOMY    ? TONSILLECTOMY    ? TUBAL LIGATION    ? ? ? ?Allergies  ?Allergen Reactions  ? Sulfa Antibiotics   ? ? ? ? ?Family History  ?Problem Relation Age of Onset  ? Cancer Mother   ?     Liver  ? Leukemia Sister   ? ? ? ?Social History ?Ms. Buster reports that she has never smoked. She has never used smokeless tobacco. ?Ms. Barrie reports no history of alcohol use. ? ? ?Review of Systems ?CONSTITUTIONAL: No  weight loss, fever, chills, weakness or fatigue.  ?HEENT: Eyes: No visual loss, blurred vision, double vision or yellow sclerae.No hearing loss, sneezing, congestion, runny nose or sore throat.  ?SKIN: No rash or itching.  ?CARDIOVASCULAR: per hpi ?RESPIRATORY: No shortness of breath, cough or sputum.  ?GASTROINTESTINAL: No anorexia, nausea, vomiting or diarrhea. No abdominal pain or blood.  ?GENITOURINARY: No burning on urination, no polyuria ?NEUROLOGICAL: No headache, dizziness, syncope, paralysis, ataxia, numbness or tingling in the extremities. No change in bowel or bladder control.  ?MUSCULOSKELETAL: No muscle, back pain, joint pain or stiffness.  ?LYMPHATICS: No enlarged nodes. No history of splenectomy.  ?PSYCHIATRIC: No history of depression or anxiety.  ?ENDOCRINOLOGIC: No reports of sweating, cold or heat intolerance. No polyuria or polydipsia.  ?. ? ? ?Physical Examination ?Today's Vitals  ? 10/02/21 1102  ?BP: (!) 160/100  ?Pulse: 64  ?SpO2:  98%  ?Weight: 141 lb 9.6 oz (64.2 kg)  ?Height: 5' (1.524 m)  ? ?Body mass index is 27.65 kg/m?. ? ?Gen: resting comfortably, no acute distress ?HEENT: no scleral icterus, pupils equal round and reactive, no palptable cervical adenopathy,  ?CV: irreg, no m/r/ gno jvd ?Resp: Clear to auscultation bilaterally ?GI: abdomen is soft, non-tender, non-distended, normal bowel sounds, no hepatosplenomegaly ?MSK: extremities are warm, no edema.  ?Skin: warm, no rash ?Neuro:  no focal deficits ?Psych: appropriate affect ? ? ?Diagnostic Studies ?10/2020 echo ?IMPRESSIONS  ? ? ? 1. Left ventricular ejection fraction, by estimation, is 60 to 65%. The  ?left ventricle has normal function. The left ventricle has no regional  ?wall motion abnormalities. There is severe left ventricular hypertrophy.  ?Left ventricular diastolic parameters  ? are indeterminate.  ? 2. Right ventricular systolic function is normal. The right ventricular  ?size is normal.  ? 3. Left atrial size was severely dilated.  ? 4. Right atrial size was severely dilated.  ? 5. The mitral valve is normal in structure. Trivial mitral valve  ?regurgitation. No evidence of mitral stenosis.  ? 6. The tricuspid valve is abnormal. Tricuspid valve regurgitation is  ?moderate.  ? 7. The aortic valve is tricuspid. There is mild calcification of the  ?aortic valve. There is mild thickening of the aortic valve. Aortic valve  ?regurgitation is not visualized. No aortic stenosis is present.  ? 8. Mild pulmonary HTN, PASP is 32 mmHg.  ? 9. The inferior vena cava is normal in size with greater than 50%  ?respiratory variability, suggesting right atrial pressure of 3 mmHg. ?  ? ? ? ?Assessment and Plan  ? ?Afib/acquired thrombophilia ?-infrequent symptoms, continue current meds ?- continue eliquis for stroke prevention ?- ekg shows rate controlled afib ?  ?2. HTN ?- bp elevated today,she will call end of the week with home bp's ?- if elevated would add aldactone 12.'5mg'$  daily vs  chlothalidone 12.'5mg'$  ?  ?3. Pulmonary HTN ?- reviwed echos as reported above, most recent study mild pulm HTN likely secondary to diastolic dysfunction and OSA. No strong indication for more involved testing  ?- continue to monitor.  ? ? ? ? ?Arnoldo Lenis, M.D. ?

## 2021-10-04 DIAGNOSIS — Z20822 Contact with and (suspected) exposure to covid-19: Secondary | ICD-10-CM | POA: Diagnosis not present

## 2021-10-07 DIAGNOSIS — Z20822 Contact with and (suspected) exposure to covid-19: Secondary | ICD-10-CM | POA: Diagnosis not present

## 2021-10-14 DIAGNOSIS — M47812 Spondylosis without myelopathy or radiculopathy, cervical region: Secondary | ICD-10-CM | POA: Diagnosis not present

## 2021-10-14 DIAGNOSIS — M5090 Cervical disc disorder, unspecified, unspecified cervical region: Secondary | ICD-10-CM | POA: Diagnosis not present

## 2021-10-17 DIAGNOSIS — Z20822 Contact with and (suspected) exposure to covid-19: Secondary | ICD-10-CM | POA: Diagnosis not present

## 2021-10-28 DIAGNOSIS — Z20822 Contact with and (suspected) exposure to covid-19: Secondary | ICD-10-CM | POA: Diagnosis not present

## 2021-11-16 DIAGNOSIS — Z20822 Contact with and (suspected) exposure to covid-19: Secondary | ICD-10-CM | POA: Diagnosis not present

## 2021-11-18 DIAGNOSIS — E673 Hypervitaminosis D: Secondary | ICD-10-CM | POA: Diagnosis not present

## 2021-11-18 DIAGNOSIS — C50411 Malignant neoplasm of upper-outer quadrant of right female breast: Secondary | ICD-10-CM | POA: Diagnosis not present

## 2021-11-18 DIAGNOSIS — Z170 Estrogen receptor positive status [ER+]: Secondary | ICD-10-CM | POA: Diagnosis not present

## 2021-11-18 DIAGNOSIS — Z20822 Contact with and (suspected) exposure to covid-19: Secondary | ICD-10-CM | POA: Diagnosis not present

## 2021-12-11 DIAGNOSIS — N182 Chronic kidney disease, stage 2 (mild): Secondary | ICD-10-CM | POA: Diagnosis not present

## 2021-12-11 DIAGNOSIS — E7849 Other hyperlipidemia: Secondary | ICD-10-CM | POA: Diagnosis not present

## 2021-12-11 DIAGNOSIS — E1122 Type 2 diabetes mellitus with diabetic chronic kidney disease: Secondary | ICD-10-CM | POA: Diagnosis not present

## 2021-12-11 DIAGNOSIS — I4819 Other persistent atrial fibrillation: Secondary | ICD-10-CM | POA: Diagnosis not present

## 2021-12-11 DIAGNOSIS — I129 Hypertensive chronic kidney disease with stage 1 through stage 4 chronic kidney disease, or unspecified chronic kidney disease: Secondary | ICD-10-CM | POA: Diagnosis not present

## 2021-12-16 DIAGNOSIS — Z6825 Body mass index (BMI) 25.0-25.9, adult: Secondary | ICD-10-CM | POA: Diagnosis not present

## 2021-12-16 DIAGNOSIS — I10 Essential (primary) hypertension: Secondary | ICD-10-CM | POA: Diagnosis not present

## 2021-12-16 DIAGNOSIS — S7001XA Contusion of right hip, initial encounter: Secondary | ICD-10-CM | POA: Diagnosis not present

## 2021-12-18 DIAGNOSIS — I10 Essential (primary) hypertension: Secondary | ICD-10-CM | POA: Diagnosis not present

## 2021-12-18 DIAGNOSIS — E1122 Type 2 diabetes mellitus with diabetic chronic kidney disease: Secondary | ICD-10-CM | POA: Diagnosis not present

## 2021-12-18 DIAGNOSIS — E782 Mixed hyperlipidemia: Secondary | ICD-10-CM | POA: Diagnosis not present

## 2021-12-18 DIAGNOSIS — E7849 Other hyperlipidemia: Secondary | ICD-10-CM | POA: Diagnosis not present

## 2021-12-20 DIAGNOSIS — S7001XA Contusion of right hip, initial encounter: Secondary | ICD-10-CM | POA: Diagnosis not present

## 2021-12-20 DIAGNOSIS — R030 Elevated blood-pressure reading, without diagnosis of hypertension: Secondary | ICD-10-CM | POA: Diagnosis not present

## 2021-12-20 DIAGNOSIS — M25551 Pain in right hip: Secondary | ICD-10-CM | POA: Diagnosis not present

## 2021-12-20 DIAGNOSIS — Z6825 Body mass index (BMI) 25.0-25.9, adult: Secondary | ICD-10-CM | POA: Diagnosis not present

## 2021-12-24 DIAGNOSIS — E7849 Other hyperlipidemia: Secondary | ICD-10-CM | POA: Diagnosis not present

## 2021-12-24 DIAGNOSIS — E1122 Type 2 diabetes mellitus with diabetic chronic kidney disease: Secondary | ICD-10-CM | POA: Diagnosis not present

## 2021-12-24 DIAGNOSIS — C50911 Malignant neoplasm of unspecified site of right female breast: Secondary | ICD-10-CM | POA: Diagnosis not present

## 2021-12-24 DIAGNOSIS — E876 Hypokalemia: Secondary | ICD-10-CM | POA: Diagnosis not present

## 2021-12-24 DIAGNOSIS — Z6825 Body mass index (BMI) 25.0-25.9, adult: Secondary | ICD-10-CM | POA: Diagnosis not present

## 2021-12-24 DIAGNOSIS — M25551 Pain in right hip: Secondary | ICD-10-CM | POA: Diagnosis not present

## 2021-12-24 DIAGNOSIS — I10 Essential (primary) hypertension: Secondary | ICD-10-CM | POA: Diagnosis not present

## 2022-01-06 DIAGNOSIS — Z9181 History of falling: Secondary | ICD-10-CM | POA: Diagnosis not present

## 2022-01-06 DIAGNOSIS — R262 Difficulty in walking, not elsewhere classified: Secondary | ICD-10-CM | POA: Diagnosis not present

## 2022-01-06 DIAGNOSIS — M6281 Muscle weakness (generalized): Secondary | ICD-10-CM | POA: Diagnosis not present

## 2022-01-06 DIAGNOSIS — M25551 Pain in right hip: Secondary | ICD-10-CM | POA: Diagnosis not present

## 2022-01-08 DIAGNOSIS — J0100 Acute maxillary sinusitis, unspecified: Secondary | ICD-10-CM | POA: Diagnosis not present

## 2022-01-08 DIAGNOSIS — R030 Elevated blood-pressure reading, without diagnosis of hypertension: Secondary | ICD-10-CM | POA: Diagnosis not present

## 2022-01-08 DIAGNOSIS — Z6826 Body mass index (BMI) 26.0-26.9, adult: Secondary | ICD-10-CM | POA: Diagnosis not present

## 2022-01-15 DIAGNOSIS — R262 Difficulty in walking, not elsewhere classified: Secondary | ICD-10-CM | POA: Diagnosis not present

## 2022-01-15 DIAGNOSIS — Z9181 History of falling: Secondary | ICD-10-CM | POA: Diagnosis not present

## 2022-01-15 DIAGNOSIS — M25551 Pain in right hip: Secondary | ICD-10-CM | POA: Diagnosis not present

## 2022-01-15 DIAGNOSIS — M6281 Muscle weakness (generalized): Secondary | ICD-10-CM | POA: Diagnosis not present

## 2022-01-28 DIAGNOSIS — H401122 Primary open-angle glaucoma, left eye, moderate stage: Secondary | ICD-10-CM | POA: Diagnosis not present

## 2022-01-28 DIAGNOSIS — H401111 Primary open-angle glaucoma, right eye, mild stage: Secondary | ICD-10-CM | POA: Diagnosis not present

## 2022-01-31 DIAGNOSIS — M25551 Pain in right hip: Secondary | ICD-10-CM | POA: Diagnosis not present

## 2022-01-31 DIAGNOSIS — R262 Difficulty in walking, not elsewhere classified: Secondary | ICD-10-CM | POA: Diagnosis not present

## 2022-01-31 DIAGNOSIS — M6281 Muscle weakness (generalized): Secondary | ICD-10-CM | POA: Diagnosis not present

## 2022-01-31 DIAGNOSIS — Z9181 History of falling: Secondary | ICD-10-CM | POA: Diagnosis not present

## 2022-02-05 DIAGNOSIS — R262 Difficulty in walking, not elsewhere classified: Secondary | ICD-10-CM | POA: Diagnosis not present

## 2022-02-05 DIAGNOSIS — Z9181 History of falling: Secondary | ICD-10-CM | POA: Diagnosis not present

## 2022-02-05 DIAGNOSIS — M25551 Pain in right hip: Secondary | ICD-10-CM | POA: Diagnosis not present

## 2022-02-05 DIAGNOSIS — M6281 Muscle weakness (generalized): Secondary | ICD-10-CM | POA: Diagnosis not present

## 2022-02-12 DIAGNOSIS — Z9181 History of falling: Secondary | ICD-10-CM | POA: Diagnosis not present

## 2022-02-12 DIAGNOSIS — M25551 Pain in right hip: Secondary | ICD-10-CM | POA: Diagnosis not present

## 2022-02-12 DIAGNOSIS — R262 Difficulty in walking, not elsewhere classified: Secondary | ICD-10-CM | POA: Diagnosis not present

## 2022-02-12 DIAGNOSIS — M6281 Muscle weakness (generalized): Secondary | ICD-10-CM | POA: Diagnosis not present

## 2022-02-20 DIAGNOSIS — M25552 Pain in left hip: Secondary | ICD-10-CM | POA: Diagnosis not present

## 2022-02-20 DIAGNOSIS — M7062 Trochanteric bursitis, left hip: Secondary | ICD-10-CM | POA: Diagnosis not present

## 2022-02-20 DIAGNOSIS — M4807 Spinal stenosis, lumbosacral region: Secondary | ICD-10-CM | POA: Diagnosis not present

## 2022-02-20 DIAGNOSIS — M47817 Spondylosis without myelopathy or radiculopathy, lumbosacral region: Secondary | ICD-10-CM | POA: Diagnosis not present

## 2022-02-21 DIAGNOSIS — M25551 Pain in right hip: Secondary | ICD-10-CM | POA: Diagnosis not present

## 2022-02-21 DIAGNOSIS — M6281 Muscle weakness (generalized): Secondary | ICD-10-CM | POA: Diagnosis not present

## 2022-02-21 DIAGNOSIS — Z9181 History of falling: Secondary | ICD-10-CM | POA: Diagnosis not present

## 2022-02-21 DIAGNOSIS — R262 Difficulty in walking, not elsewhere classified: Secondary | ICD-10-CM | POA: Diagnosis not present

## 2022-03-05 DIAGNOSIS — R262 Difficulty in walking, not elsewhere classified: Secondary | ICD-10-CM | POA: Diagnosis not present

## 2022-03-05 DIAGNOSIS — M25551 Pain in right hip: Secondary | ICD-10-CM | POA: Diagnosis not present

## 2022-03-05 DIAGNOSIS — M6281 Muscle weakness (generalized): Secondary | ICD-10-CM | POA: Diagnosis not present

## 2022-03-05 DIAGNOSIS — Z9181 History of falling: Secondary | ICD-10-CM | POA: Diagnosis not present

## 2022-03-12 DIAGNOSIS — M6281 Muscle weakness (generalized): Secondary | ICD-10-CM | POA: Diagnosis not present

## 2022-03-12 DIAGNOSIS — M25551 Pain in right hip: Secondary | ICD-10-CM | POA: Diagnosis not present

## 2022-03-12 DIAGNOSIS — R262 Difficulty in walking, not elsewhere classified: Secondary | ICD-10-CM | POA: Diagnosis not present

## 2022-03-12 DIAGNOSIS — Z9181 History of falling: Secondary | ICD-10-CM | POA: Diagnosis not present

## 2022-03-13 DIAGNOSIS — M25552 Pain in left hip: Secondary | ICD-10-CM | POA: Diagnosis not present

## 2022-03-13 DIAGNOSIS — M7062 Trochanteric bursitis, left hip: Secondary | ICD-10-CM | POA: Diagnosis not present

## 2022-03-13 DIAGNOSIS — M47817 Spondylosis without myelopathy or radiculopathy, lumbosacral region: Secondary | ICD-10-CM | POA: Diagnosis not present

## 2022-03-13 DIAGNOSIS — M4807 Spinal stenosis, lumbosacral region: Secondary | ICD-10-CM | POA: Diagnosis not present

## 2022-03-19 DIAGNOSIS — M6281 Muscle weakness (generalized): Secondary | ICD-10-CM | POA: Diagnosis not present

## 2022-03-19 DIAGNOSIS — M25551 Pain in right hip: Secondary | ICD-10-CM | POA: Diagnosis not present

## 2022-03-19 DIAGNOSIS — R262 Difficulty in walking, not elsewhere classified: Secondary | ICD-10-CM | POA: Diagnosis not present

## 2022-03-19 DIAGNOSIS — Z9181 History of falling: Secondary | ICD-10-CM | POA: Diagnosis not present

## 2022-03-20 DIAGNOSIS — M810 Age-related osteoporosis without current pathological fracture: Secondary | ICD-10-CM | POA: Diagnosis not present

## 2022-03-20 DIAGNOSIS — E1142 Type 2 diabetes mellitus with diabetic polyneuropathy: Secondary | ICD-10-CM | POA: Diagnosis not present

## 2022-03-20 DIAGNOSIS — E7849 Other hyperlipidemia: Secondary | ICD-10-CM | POA: Diagnosis not present

## 2022-03-20 DIAGNOSIS — E875 Hyperkalemia: Secondary | ICD-10-CM | POA: Diagnosis not present

## 2022-03-25 ENCOUNTER — Encounter: Payer: Self-pay | Admitting: Cardiology

## 2022-03-25 DIAGNOSIS — R079 Chest pain, unspecified: Secondary | ICD-10-CM | POA: Diagnosis not present

## 2022-03-25 DIAGNOSIS — J984 Other disorders of lung: Secondary | ICD-10-CM | POA: Diagnosis not present

## 2022-03-25 DIAGNOSIS — N189 Chronic kidney disease, unspecified: Secondary | ICD-10-CM | POA: Diagnosis not present

## 2022-03-25 DIAGNOSIS — R112 Nausea with vomiting, unspecified: Secondary | ICD-10-CM | POA: Diagnosis not present

## 2022-03-25 DIAGNOSIS — Z87891 Personal history of nicotine dependence: Secondary | ICD-10-CM | POA: Diagnosis not present

## 2022-03-25 DIAGNOSIS — I129 Hypertensive chronic kidney disease with stage 1 through stage 4 chronic kidney disease, or unspecified chronic kidney disease: Secondary | ICD-10-CM | POA: Diagnosis not present

## 2022-03-25 DIAGNOSIS — I10 Essential (primary) hypertension: Secondary | ICD-10-CM | POA: Diagnosis not present

## 2022-03-25 DIAGNOSIS — R1084 Generalized abdominal pain: Secondary | ICD-10-CM | POA: Diagnosis not present

## 2022-03-25 DIAGNOSIS — I4891 Unspecified atrial fibrillation: Secondary | ICD-10-CM | POA: Diagnosis not present

## 2022-03-25 DIAGNOSIS — R911 Solitary pulmonary nodule: Secondary | ICD-10-CM | POA: Diagnosis not present

## 2022-03-25 DIAGNOSIS — Z79899 Other long term (current) drug therapy: Secondary | ICD-10-CM | POA: Diagnosis not present

## 2022-03-25 DIAGNOSIS — E785 Hyperlipidemia, unspecified: Secondary | ICD-10-CM | POA: Diagnosis not present

## 2022-03-25 DIAGNOSIS — K5730 Diverticulosis of large intestine without perforation or abscess without bleeding: Secondary | ICD-10-CM | POA: Diagnosis not present

## 2022-03-25 DIAGNOSIS — E1122 Type 2 diabetes mellitus with diabetic chronic kidney disease: Secondary | ICD-10-CM | POA: Diagnosis not present

## 2022-03-25 DIAGNOSIS — N200 Calculus of kidney: Secondary | ICD-10-CM | POA: Diagnosis not present

## 2022-03-25 DIAGNOSIS — R109 Unspecified abdominal pain: Secondary | ICD-10-CM | POA: Diagnosis not present

## 2022-03-26 DIAGNOSIS — Z9181 History of falling: Secondary | ICD-10-CM | POA: Diagnosis not present

## 2022-03-26 DIAGNOSIS — M6281 Muscle weakness (generalized): Secondary | ICD-10-CM | POA: Diagnosis not present

## 2022-03-26 DIAGNOSIS — M25551 Pain in right hip: Secondary | ICD-10-CM | POA: Diagnosis not present

## 2022-03-26 DIAGNOSIS — R262 Difficulty in walking, not elsewhere classified: Secondary | ICD-10-CM | POA: Diagnosis not present

## 2022-04-02 DIAGNOSIS — R2689 Other abnormalities of gait and mobility: Secondary | ICD-10-CM | POA: Diagnosis not present

## 2022-04-02 DIAGNOSIS — I482 Chronic atrial fibrillation, unspecified: Secondary | ICD-10-CM | POA: Diagnosis not present

## 2022-04-02 DIAGNOSIS — M25551 Pain in right hip: Secondary | ICD-10-CM | POA: Diagnosis not present

## 2022-04-02 DIAGNOSIS — Z6824 Body mass index (BMI) 24.0-24.9, adult: Secondary | ICD-10-CM | POA: Diagnosis not present

## 2022-04-02 DIAGNOSIS — E876 Hypokalemia: Secondary | ICD-10-CM | POA: Diagnosis not present

## 2022-04-02 DIAGNOSIS — E7849 Other hyperlipidemia: Secondary | ICD-10-CM | POA: Diagnosis not present

## 2022-04-02 DIAGNOSIS — R748 Abnormal levels of other serum enzymes: Secondary | ICD-10-CM | POA: Diagnosis not present

## 2022-04-02 DIAGNOSIS — R911 Solitary pulmonary nodule: Secondary | ICD-10-CM | POA: Diagnosis not present

## 2022-04-02 DIAGNOSIS — E1122 Type 2 diabetes mellitus with diabetic chronic kidney disease: Secondary | ICD-10-CM | POA: Diagnosis not present

## 2022-04-02 DIAGNOSIS — I10 Essential (primary) hypertension: Secondary | ICD-10-CM | POA: Diagnosis not present

## 2022-04-02 DIAGNOSIS — E782 Mixed hyperlipidemia: Secondary | ICD-10-CM | POA: Diagnosis not present

## 2022-04-02 DIAGNOSIS — C50911 Malignant neoplasm of unspecified site of right female breast: Secondary | ICD-10-CM | POA: Diagnosis not present

## 2022-04-07 ENCOUNTER — Encounter: Payer: Self-pay | Admitting: *Deleted

## 2022-04-07 ENCOUNTER — Encounter: Payer: Self-pay | Admitting: Cardiology

## 2022-04-07 ENCOUNTER — Ambulatory Visit: Payer: Medicare Other | Attending: Cardiology | Admitting: Cardiology

## 2022-04-07 VITALS — BP 158/90 | HR 72 | Ht 60.0 in | Wt 130.6 lb

## 2022-04-07 DIAGNOSIS — R1084 Generalized abdominal pain: Secondary | ICD-10-CM

## 2022-04-07 DIAGNOSIS — Z79899 Other long term (current) drug therapy: Secondary | ICD-10-CM

## 2022-04-07 DIAGNOSIS — D6869 Other thrombophilia: Secondary | ICD-10-CM

## 2022-04-07 DIAGNOSIS — I4891 Unspecified atrial fibrillation: Secondary | ICD-10-CM | POA: Diagnosis not present

## 2022-04-07 DIAGNOSIS — I10 Essential (primary) hypertension: Secondary | ICD-10-CM

## 2022-04-07 MED ORDER — SPIRONOLACTONE 25 MG PO TABS: 25.0000 mg | ORAL_TABLET | Freq: Every day | ORAL | 1 refills | Status: DC

## 2022-04-07 MED ORDER — SPIRONOLACTONE 25 MG PO TABS: 25.0000 mg | ORAL_TABLET | Freq: Every day | ORAL | 0 refills | Status: DC

## 2022-04-07 NOTE — Progress Notes (Signed)
Clinical Summary Ms. Faye is a 76 y.o.female seen today for follow up of the following medical problems.   Afib - rare palpitaitons - mixed compliance with meds, sometimes forgets - no bleeding on eliquis.    - no palpitaitnos - no bleeding on eliquis.    2. HTN - home bp's 120s/70s - reports on chlorthaildone '25mg'$  daily.    3.Breast cancer     4.Pulmonary HTN - April 2021 echo reported PASP in the 80s. On review unclear of that calculation, highest in the 81s.  - 10/2020 echo LVEF 08-67%, indet diastolic, normal RV, severe BAE, mod TR, PASP is 32 - severe BAE would suggest significant diastolic dysfunction> she also has OSA - PFTs were unremarkable, negative serologies.   - denies SOB/DOE   5.OSA - has not wanted to wear cpap   6.Chest pain/Abdominal pain - ER visit 03/25/22 [facility] - presented with abdominal pain,chest pain, N/V? Trop neg, EKG report SR no acute ischemic changes  CT C/A/P no acute process   - 3-4 days epigastric pain after eating, better with antacid - N/V days leading up to ER visit - pressure like pain epgiastrcium, tender to palpation - all symptoms have resolved.     Social history: She is originally from [place], [place].  She is widowed.  Her husband passed away in 10-08-17.  She has an adult daughter, Tilda Burrow, who lives in the [place] area. She has another daughter in [place] in the [place]. area. She has 2 sons in [place]. She raised herself since the age of 59 initially in [place], [place]. Past Medical History:  Diagnosis Date   Atrial fibrillation ([place])    Kidney stone    resulting in stenting   Type 2 diabetes mellitus (HCC)    Unspecified essential hypertension      Allergies  Allergen Reactions   Sulfa Antibiotics      Current Outpatient Medications  Medication Sig Dispense Refill   alendronate (FOSAMAX) 70 MG tablet Take 70 mg by mouth once a week. Take with a full glass of water on an empty stomach.      apixaban (ELIQUIS) 5 MG TABS tablet Take 1 tablet (5 mg total) by mouth 2 (two) times daily. 28 tablet 0   atorvastatin (LIPITOR) 40 MG tablet Take 40 mg by mouth at bedtime.     carvedilol (COREG CR) 20 MG 24 hr capsule Take 20 mg by mouth daily.     diltiazem (CARDIZEM CD) 360 MG 24 hr capsule Take 360 mg by mouth daily.     Empagliflozin (JARDIANCE PO) Take 1 tablet by mouth at bedtime.     fluticasone (FLONASE) 50 MCG/ACT nasal spray Place 2 sprays into the nose daily.     glipiZIDE (GLUCOTROL) 10 MG tablet Take 10 mg by mouth daily before breakfast.     letrozole (FEMARA) 2.5 MG tablet Take 2.5 mg by mouth daily.     Liraglutide (VICTOZA [place]) Inject 1.8 mg into the skin daily.      loratadine (CLARITIN) 10 MG tablet Take 10 mg by mouth daily.     losartan (COZAAR) 100 MG tablet Take 100 mg by mouth daily.     metFORMIN (GLUCOPHAGE) 1000 MG tablet Take 1,000 mg by mouth 2 (two) times daily with a meal.     No current facility-administered medications for this visit.     Past Surgical History:  Procedure Laterality Date   ADENOIDECTOMY     TONSILLECTOMY  TUBAL LIGATION       Allergies  Allergen Reactions   Sulfa Antibiotics       Family History  Problem Relation Age of Onset   Cancer Mother        Liver   Leukemia Sister      Social History Ms. Korf reports that she has never smoked. She has never used smokeless tobacco. Ms. Denson reports no history of alcohol use.   Review of Systems CONSTITUTIONAL: No weight loss, fever, chills, weakness or fatigue.  HEENT: Eyes: No visual loss, blurred vision, double vision or yellow sclerae.No hearing loss, sneezing, congestion, runny nose or sore throat.  SKIN: No rash or itching.  CARDIOVASCULAR: pe rhpi RESPIRATORY: No shortness of breath, cough or sputum.  GASTROINTESTINAL: per hpi  GENITOURINARY: No burning on urination, no polyuria NEUROLOGICAL: No headache, dizziness, syncope, paralysis, ataxia, numbness or  tingling in the extremities. No change in bowel or bladder control.  MUSCULOSKELETAL: No muscle, back pain, joint pain or stiffness.  LYMPHATICS: No enlarged nodes. No history of splenectomy.  PSYCHIATRIC: No history of depression or anxiety.  ENDOCRINOLOGIC: No reports of sweating, cold or heat intolerance. No polyuria or polydipsia.  Marland Kitchen   Physical Examination Today's Vitals   04/07/22 1119  BP: (!) 158/90  Pulse: 72  SpO2: 98%  Weight: 130 lb 9.6 oz (59.2 kg)  Height: 5' (1.524 m)   Body mass index is 25.51 kg/m.  Gen: resting comfortably, no acute distress HEENT: no scleral icterus, pupils equal round and reactive, no palptable cervical adenopathy,  CV: RRR, no m/rg no jvd Resp: Clear to auscultation bilaterally GI: abdomen is soft, non-tender, non-distended, normal bowel sounds, no hepatosplenomegaly MSK: extremities are warm, no edema.  Skin: warm, no rash Neuro:  no focal deficits Psych: appropriate affect   Diagnostic Studies  10/2020 echo IMPRESSIONS     1. Left ventricular ejection fraction, by estimation, is 60 to 65%. The  left ventricle has normal function. The left ventricle has no regional  wall motion abnormalities. There is severe left ventricular hypertrophy.  Left ventricular diastolic parameters   are indeterminate.   2. Right ventricular systolic function is normal. The right ventricular  size is normal.   3. Left atrial size was severely dilated.   4. Right atrial size was severely dilated.   5. The mitral valve is normal in structure. Trivial mitral valve  regurgitation. No evidence of mitral stenosis.   6. The tricuspid valve is abnormal. Tricuspid valve regurgitation is  moderate.   7. The aortic valve is tricuspid. There is mild calcification of the  aortic valve. There is mild thickening of the aortic valve. Aortic valve  regurgitation is not visualized. No aortic stenosis is present.   8. Mild pulmonary HTN, PASP is 32 mmHg.   9. The  inferior vena cava is normal in size with greater than 50%  respiratory variability, suggesting right atrial pressure of 3 mmHg.     Assessment and Plan  Afib/acquired thrombophilia -no symptoms, continue current meds   2. HTN - above goal, add aldactone '25mg'$  daily. Pcp had added chlortahldione '25mg'$  daily few months back - recheck bmet/mg 2 weeks.    3. Pulmonary HTN - reviwed echos as reported above, most recent study mild pulm HTN likely secondary to diastolic dysfunction and OSA. No strong indication for more involved testing  - we will continue to monitor, no symptoms.       Arnoldo Lenis, M.D.

## 2022-04-07 NOTE — Patient Instructions (Signed)
Medication Instructions:  Your physician has recommended you make the following change in your medication:  Start aldactone 25 mg once a day Continue all other medications as directed  Labwork: BMET, MAG (in 2 weeks @ [facility])  Testing/Procedures: none  Follow-Up:  Your physician recommends that you schedule a follow-up appointment in: 6 months  Any Other Special Instructions Will Be Listed Below (If Applicable).  You have been referred to Gastroenterology  If you need a refill on your cardiac medications before your next appointment, please call your pharmacy.

## 2022-04-08 ENCOUNTER — Encounter: Payer: Self-pay | Admitting: *Deleted

## 2022-04-08 DIAGNOSIS — I10 Essential (primary) hypertension: Secondary | ICD-10-CM | POA: Diagnosis not present

## 2022-04-08 DIAGNOSIS — Z79811 Long term (current) use of aromatase inhibitors: Secondary | ICD-10-CM | POA: Diagnosis not present

## 2022-04-08 DIAGNOSIS — Z7983 Long term (current) use of bisphosphonates: Secondary | ICD-10-CM | POA: Diagnosis not present

## 2022-04-08 DIAGNOSIS — Z882 Allergy status to sulfonamides status: Secondary | ICD-10-CM | POA: Diagnosis not present

## 2022-04-08 DIAGNOSIS — Z170 Estrogen receptor positive status [ER+]: Secondary | ICD-10-CM | POA: Diagnosis not present

## 2022-04-08 DIAGNOSIS — I129 Hypertensive chronic kidney disease with stage 1 through stage 4 chronic kidney disease, or unspecified chronic kidney disease: Secondary | ICD-10-CM | POA: Diagnosis not present

## 2022-04-08 DIAGNOSIS — Z08 Encounter for follow-up examination after completed treatment for malignant neoplasm: Secondary | ICD-10-CM | POA: Diagnosis not present

## 2022-04-08 DIAGNOSIS — N6489 Other specified disorders of breast: Secondary | ICD-10-CM | POA: Diagnosis not present

## 2022-04-08 DIAGNOSIS — Z87891 Personal history of nicotine dependence: Secondary | ICD-10-CM | POA: Diagnosis not present

## 2022-04-08 DIAGNOSIS — Z7901 Long term (current) use of anticoagulants: Secondary | ICD-10-CM | POA: Diagnosis not present

## 2022-04-08 DIAGNOSIS — N189 Chronic kidney disease, unspecified: Secondary | ICD-10-CM | POA: Diagnosis not present

## 2022-04-08 DIAGNOSIS — Z853 Personal history of malignant neoplasm of breast: Secondary | ICD-10-CM | POA: Diagnosis not present

## 2022-04-08 DIAGNOSIS — I4891 Unspecified atrial fibrillation: Secondary | ICD-10-CM | POA: Diagnosis not present

## 2022-04-08 DIAGNOSIS — E785 Hyperlipidemia, unspecified: Secondary | ICD-10-CM | POA: Diagnosis not present

## 2022-04-08 DIAGNOSIS — Z7984 Long term (current) use of oral hypoglycemic drugs: Secondary | ICD-10-CM | POA: Diagnosis not present

## 2022-04-08 DIAGNOSIS — Z79899 Other long term (current) drug therapy: Secondary | ICD-10-CM | POA: Diagnosis not present

## 2022-04-08 DIAGNOSIS — Z7985 Long-term (current) use of injectable non-insulin antidiabetic drugs: Secondary | ICD-10-CM | POA: Diagnosis not present

## 2022-04-08 DIAGNOSIS — E1122 Type 2 diabetes mellitus with diabetic chronic kidney disease: Secondary | ICD-10-CM | POA: Diagnosis not present

## 2022-04-08 DIAGNOSIS — C50411 Malignant neoplasm of upper-outer quadrant of right female breast: Secondary | ICD-10-CM | POA: Diagnosis not present

## 2022-04-10 ENCOUNTER — Telehealth: Payer: Self-pay | Admitting: Cardiology

## 2022-04-10 NOTE — Telephone Encounter (Signed)
Went over medications with nurse- verbalized understanding.

## 2022-04-10 NOTE — Telephone Encounter (Signed)
Shelly from Surgical Oncology at [facility] called and wanted to go over the patient's medication specially the new one that was prescribed on 9/27 by Dr. Harl Bowie.

## 2022-04-12 DIAGNOSIS — E782 Mixed hyperlipidemia: Secondary | ICD-10-CM | POA: Diagnosis not present

## 2022-04-12 DIAGNOSIS — E1122 Type 2 diabetes mellitus with diabetic chronic kidney disease: Secondary | ICD-10-CM | POA: Diagnosis not present

## 2022-04-21 DIAGNOSIS — Z853 Personal history of malignant neoplasm of breast: Secondary | ICD-10-CM | POA: Diagnosis not present

## 2022-04-21 DIAGNOSIS — R920 Mammographic microcalcification found on diagnostic imaging of breast: Secondary | ICD-10-CM | POA: Diagnosis not present

## 2022-04-21 DIAGNOSIS — L905 Scar conditions and fibrosis of skin: Secondary | ICD-10-CM | POA: Diagnosis not present

## 2022-04-21 DIAGNOSIS — R921 Mammographic calcification found on diagnostic imaging of breast: Secondary | ICD-10-CM | POA: Diagnosis not present

## 2022-04-21 NOTE — Progress Notes (Signed)
Referring Provider: Arnoldo Lenis, MD Primary Care Physician:  Betty Goodman Primary Gastroenterologist:  Betty Goodman  Chief Complaint  Patient presents with   Constipation    Food is not digesting. Feel like it is stuck in the middle of stomach. Has had a lot of constipation in the last year.     HPI:   Betty Goodman is a 76 y.o. female presenting today at the request of Betty Goodman, Betty Guild, MD for abdominal pain.  Patient saw Betty Goodman 04/07/2022 for follow-up of A-fib, hypertension, pulmonary HTN. Clinically doing fairly well from cardiac standpoint.  Blood pressure was above goal however and she was started on Aldactone 25 mg daily.  Noted she had been seen in the emergency room 03/25/2022 at [facility] neg, EKG report SR no acute ischemic changes. LFTs normal. Lipase 140. CT C/A/P no acute process. She reported she had 3-4 days of epigastric pain postprandially that improved with antacid, nausea/vomiting leading to ER visit.  At the time of office visit with Betty Goodman, all GI symptoms had resolved.    Today:  Intermittently over the last year, she has episodes where she feels foods are not processing correctly. Feels they get stuck in the upper abdomen or at the periumbilical area. Associated mild discomfort. Will take an alkaseltzer and end up vomiting, then feel better. Occurs once every 3 months or so and is asymptomatic between episodes. Last episode occurred about 3 weeks ago. Vomited up what looked like bile and this is when she went to the ER. No symptoms since that time. No brbpr or melena.   Initially thought her symptoms were triggered by fried foods, so she cut this out which helped. Last occurrence was after eating egg salad sandwich.   Over the last couple of months, she was having intermittent burning/discomfort in her mid chest, about every other day, but hasn't had any in the last couple of weeks since starting fiber and yogurt and taking tums every 3  days. She does have a lot of belching, but tums is also helping this.   No dysphagia, nausea, vomiting, early satiety.   Takes tylenol daily- 500 mg BID.  No NSIADs.   Has been losing weight. Changed her diet quite a but because of the abdominal discomfort she was having. Avoids starches. Avoids fried foods. Does a lot of salads and vegetables. No early satiety.   Constipation:  Increased constipation over the last year.  Stools have been hard. Never skipped days, just hard, incomplete bowel movements.  Having to do an enema about once a month.  Taking fiber daily and eating yogurt.  Will take liquid dulcolax, 1/4 dose twice a week.  Bowels are moving every day with this regimen, but still intermittently hard.  Never tried MiraLAX.    Had radiation for right breast cancer in 2020. No abdominal surgeries.   Prior EGD: Never.  Prior colonoscopy: Last colonoscopy at [facility] a couple years ago with normal exam.    Past Medical History:  Diagnosis Date   Atrial fibrillation ([place])    Kidney stone    resulting in stenting   Type 2 diabetes mellitus ([place])    Unspecified essential hypertension     Past Surgical History:  Procedure Laterality Date   ADENOIDECTOMY     TONSILLECTOMY     TUBAL LIGATION      Current Outpatient Medications  Medication Sig Dispense Refill   alendronate (FOSAMAX) 70 MG tablet Take 70  mg by mouth once a week. Take with a full glass of water on an empty stomach.     apixaban (ELIQUIS) 5 MG TABS tablet Take 1 tablet (5 mg total) by mouth 2 (two) times daily. 28 tablet 0   atorvastatin (LIPITOR) 40 MG tablet Take 40 mg by mouth at bedtime.     brimonidine-timolol (COMBIGAN) 0.2-0.5 % ophthalmic solution Place 1 drop into both eyes every 12 (twelve) hours.     carvedilol (COREG CR) 20 MG 24 hr capsule Take 20 mg by mouth daily.     Cholecalciferol (VITAMIN D) 50 MCG (2000 UT) CAPS Take 2 capsules by mouth 2 (two) times daily.     diltiazem  (CARDIZEM CD) 360 MG 24 hr capsule Take 360 mg by mouth daily.     empagliflozin (JARDIANCE) 25 MG TABS tablet Take 25 mg by mouth daily.     fluticasone (FLONASE) 50 MCG/ACT nasal spray Place 2 sprays into the nose daily.     glipiZIDE (GLUCOTROL) 10 MG tablet Take 10 mg by mouth daily before breakfast.     letrozole (FEMARA) 2.5 MG tablet Take 2.5 mg by mouth daily.     Liraglutide (VICTOZA [place]) Inject 1.8 mg into the skin daily.      loratadine (CLARITIN) 10 MG tablet Take 10 mg by mouth daily.     losartan (COZAAR) 100 MG tablet Take 100 mg by mouth daily.     metFORMIN (GLUCOPHAGE-XR) 500 MG 24 hr tablet Take 1,000 mg by mouth 2 (two) times daily.     pantoprazole (PROTONIX) 40 MG tablet Take 1 tablet (40 mg total) by mouth daily before breakfast. 30 tablet 0   pantoprazole (PROTONIX) 40 MG tablet Take 1 tablet (40 mg total) by mouth daily. 30 tablet 2   spironolactone (ALDACTONE) 25 MG tablet Take 1 tablet (25 mg total) by mouth daily. 90 tablet 1   No current facility-administered medications for this visit.    Allergies as of 04/24/2022 - Review Complete 04/24/2022  Allergen Reaction Noted   Sulfa antibiotics  12/26/2011    Family History  Problem Relation Age of Onset   Cancer Mother        Liver   Leukemia Sister    Colon cancer Neg Hx     Social History   Socioeconomic History   Marital status: Widowed    Spouse name: Not on file   Number of children: 4   Years of education: Not on file   Highest education level: Not on file  Occupational History   Occupation: Retired  Tobacco Use   Smoking status: Never   Smokeless tobacco: Never  Vaping Use   Vaping Use: Never used  Substance and Sexual Activity   Alcohol use: No   Drug use: Not on file   Sexual activity: Not on file  Other Topics Concern   Not on file  Social History Narrative   Lives with husband one daughter and two grandchildren.   Social Determinants of Health   Financial Resource Strain: Not on  file  Food Insecurity: Not on file  Transportation Needs: Not on file  Physical Activity: Not on file  Stress: Not on file  Social Connections: Not on file  Intimate Partner Violence: Not on file    Review of Systems: Gen: Denies any fever, chills, cold or flu like symptoms, presyncope, syncope. CV: Denies chest pain, heart palpitations.  Resp: Denies shortness of breath, cough.  GI: See HPI GU : Denies urinary  burning, urinary frequency, urinary hesitancy MS: Denies joint pain. Derm: Denies rash. Psych: Denies depression, anxiety. Heme: See HPI  Physical Exam: BP (!) 160/92   Pulse (!) 56   Temp 97.7 F (36.5 C) (Temporal)   Ht 5' (1.524 m)   Wt 131 lb 9.6 oz (59.7 kg)   SpO2 99%   BMI 25.70 kg/m  General:   Alert and oriented. Pleasant and cooperative. Well-nourished and well-developed.  Head:  Normocephalic and atraumatic. Eyes:  Without icterus, sclera clear and conjunctiva pink.  Ears:  Normal auditory acuity. Lungs:  Clear to auscultation bilaterally. No wheezes, rales, or rhonchi. No distress.  Heart:  S1, S2 present without murmurs appreciated.  Abdomen:  +BS, soft, non-tender and non-distended. No HSM noted. No guarding or rebound. No masses appreciated.  Rectal:  Deferred  Msk:  Symmetrical without gross deformities. Normal posture. Extremities:  Without edema. Neurologic:  Alert and  oriented x4;  grossly normal neurologically. Skin:  Intact without significant lesions or rashes. Psych: Normal mood and affect.    Assessment:  76 year old female with history of atrial fibrillation, type 2 diabetes, HTN, right breast cancer, presenting today for further evaluation of abdominal pain at the request of Betty Goodman.  Also discussed heartburn, constipation, and weight loss.  Abdominal pain/heartburn  Intermittent sensation of foods getting stuck in the epigastric or periumbilical area with associated mild abdominal discomfort over the last year for which she will  take an alka seltzer that causes vomiting with resolution of symptoms. Symptoms occur once every 3 months, asymptomatic between episodes.  Initially thought symptoms were secondary to fried/fatty foods and eliminated these from her diet, but has still had occasional flares. Evaluated in the emergency room for the same symptoms in September.  LFTs normal.  Lipase elevated at 140. Glucose elevated at 187. CT A/P without contrast with no significant abnormalities. She does report new onset heartburn over the last couple of months and increased belching that is now fairly well controlled with Tums every 3 days.  Denies NSAIDs. It is notable that she has lost 10 lbs in the last 7 months, 20 lbs in the last 2 years. She attributes weight loss this year due to significant dietary changes in efforts to prevent flares of abdominal pain.  Etiology is unclear. Query whether symptoms are secondary to atypical GERD, gastritis, duodenitis. Unable to rule out PUD, malignancy, intermittent delay in gastric emptying in the setting of diabetes. Considering postprandial symptoms and weight loss, mesenteric ischemia remains in the differential, but this seems less likely at this time as she goes several months without symptoms.   Discussed EGD for further evaluation, but patient prefers to try medical management first. I have recommended starting PPI daily and monitoring for now.   Constipation: Increase passage of hard stools over the last year. No brbpr or melena. Some improvement with taking fiber daily along with one quarter dose liquid Dulcolax twice weekly, but still with intermittent hard, incomplete bowel movements.  Reports her last colonoscopy was a couple years ago at [facility] with normal exam.  Recommended trial of MiraLAX and will request records.  Weight loss:  Down 10 lbs in the last 7 months, 20 lbs in the last 2 years. She attributes weight loss this year due to significant dietary changes in efforts to  prevent flares of abdominal pain discussed above. Recent CT without contrast in September without abnormality. Reports colonoscopy is up to date as of a couple years ago at [facility] with  normal exam. We will request records. Considered EGD due to intermittent upper GI symptoms discussed above and weight loss, but patient prefers to hold off on this for now. Will continue to monitor.    Plan:  Start Pantoprazole 40 mg daily 30 minutes before breakfast.  Reinforced GERD diet/lifestyle.  Separate instructions provided on AVS. Patient is to monitor for recurrent abdominal pain and keep a log of when this occurs, what she has eaten around this time, and any other associated symptoms such as increased reflux etc. Start MiraLAX 17 g daily. Continue daily fiber supplement. Request [facility] records from [facility].  Follow-up in 3 months or sooner if needed.   Betty Altes, PA-C [facility] 04/24/2022

## 2022-04-24 ENCOUNTER — Encounter: Payer: Self-pay | Admitting: Gastroenterology

## 2022-04-24 ENCOUNTER — Telehealth: Payer: Self-pay | Admitting: Gastroenterology

## 2022-04-24 ENCOUNTER — Ambulatory Visit (INDEPENDENT_AMBULATORY_CARE_PROVIDER_SITE_OTHER): Payer: Medicare Other | Admitting: Gastroenterology

## 2022-04-24 VITALS — BP 160/92 | HR 56 | Temp 97.7°F | Ht 60.0 in | Wt 131.6 lb

## 2022-04-24 DIAGNOSIS — K219 Gastro-esophageal reflux disease without esophagitis: Secondary | ICD-10-CM

## 2022-04-24 DIAGNOSIS — K5900 Constipation, unspecified: Secondary | ICD-10-CM | POA: Diagnosis not present

## 2022-04-24 DIAGNOSIS — R1033 Periumbilical pain: Secondary | ICD-10-CM | POA: Insufficient documentation

## 2022-04-24 MED ORDER — PANTOPRAZOLE SODIUM 40 MG PO TBEC: 40.0000 mg | DELAYED_RELEASE_TABLET | Freq: Every day | ORAL | 2 refills | Status: DC

## 2022-04-24 MED ORDER — PANTOPRAZOLE SODIUM 40 MG PO TBEC: 40.0000 mg | DELAYED_RELEASE_TABLET | Freq: Every day | ORAL | 0 refills | Status: DC

## 2022-04-24 NOTE — Telephone Encounter (Signed)
Received colonoscopy records from [facility].  Procedure date 08/17/2019 with Dr. Rocco Serene.   Indication: "76 year old female here for colonoscopy due to weight loss and possible family history of maternal grandmother with colon cancer." Impression: Mild nonbleeding diverticulosis in the left colon. Recall: Given questionable maternal grandmother with colon cancer, discuss repeat colonoscopy in 5-10 years.  No additional recommendations at this time.

## 2022-04-24 NOTE — Patient Instructions (Signed)
For acid reflux, start pantoprazole 40 mg daily 30 minutes before breakfast.  Follow a GERD diet lifestyle:  Avoid fried, fatty, greasy, spicy, citrus foods. Avoid caffeine and carbonated beverages. Avoid chocolate. Try eating 4-6 small meals a day rather than 3 large meals. Do not eat within 3 hours of laying down. Prop head of bed up on wood or bricks to create a 6 inch incline.  Monitor for recurrent abdominal pain and keep a log of when this occurs and what you have eaten around that time.  Please also keep track of whether or not you have any increased acid reflux symptoms during this time.  Start MiraLAX 1 capful (17g) daily in 8 oz of water or other noncarbonated beverage for constipation.  Continue fiber supplement daily.  We will follow-up with you in 3 months.  Do not hesitate to call sooner if you have questions or concerns.  Aliene Altes, PA-C [facility]

## 2022-04-28 DIAGNOSIS — Z170 Estrogen receptor positive status [ER+]: Secondary | ICD-10-CM | POA: Diagnosis not present

## 2022-04-28 DIAGNOSIS — Z79811 Long term (current) use of aromatase inhibitors: Secondary | ICD-10-CM | POA: Diagnosis not present

## 2022-04-28 DIAGNOSIS — C50411 Malignant neoplasm of upper-outer quadrant of right female breast: Secondary | ICD-10-CM | POA: Diagnosis not present

## 2022-04-30 DIAGNOSIS — Z9181 History of falling: Secondary | ICD-10-CM | POA: Diagnosis not present

## 2022-04-30 DIAGNOSIS — R262 Difficulty in walking, not elsewhere classified: Secondary | ICD-10-CM | POA: Diagnosis not present

## 2022-04-30 DIAGNOSIS — M6281 Muscle weakness (generalized): Secondary | ICD-10-CM | POA: Diagnosis not present

## 2022-05-07 DIAGNOSIS — M6281 Muscle weakness (generalized): Secondary | ICD-10-CM | POA: Diagnosis not present

## 2022-05-07 DIAGNOSIS — R262 Difficulty in walking, not elsewhere classified: Secondary | ICD-10-CM | POA: Diagnosis not present

## 2022-05-07 DIAGNOSIS — Z9181 History of falling: Secondary | ICD-10-CM | POA: Diagnosis not present

## 2022-05-14 DIAGNOSIS — Z9181 History of falling: Secondary | ICD-10-CM | POA: Diagnosis not present

## 2022-05-14 DIAGNOSIS — R262 Difficulty in walking, not elsewhere classified: Secondary | ICD-10-CM | POA: Diagnosis not present

## 2022-05-14 DIAGNOSIS — M6281 Muscle weakness (generalized): Secondary | ICD-10-CM | POA: Diagnosis not present

## 2022-05-15 ENCOUNTER — Encounter: Payer: Medicare Other | Attending: General Practice | Admitting: Nutrition

## 2022-05-15 VITALS — Ht 60.0 in | Wt 131.0 lb

## 2022-05-15 DIAGNOSIS — K2100 Gastro-esophageal reflux disease with esophagitis, without bleeding: Secondary | ICD-10-CM | POA: Insufficient documentation

## 2022-05-15 DIAGNOSIS — R634 Abnormal weight loss: Secondary | ICD-10-CM | POA: Diagnosis not present

## 2022-05-15 DIAGNOSIS — E118 Type 2 diabetes mellitus with unspecified complications: Secondary | ICD-10-CM | POA: Insufficient documentation

## 2022-05-15 DIAGNOSIS — I10 Essential (primary) hypertension: Secondary | ICD-10-CM | POA: Diagnosis not present

## 2022-05-15 NOTE — Patient Instructions (Addendum)
Goals  Eat three meals per day Increase fresh fruits and vegetables. Drink 4 bottles of water per day Check blood sugars 4 times per day Keep a food journal

## 2022-05-15 NOTE — Progress Notes (Signed)
Medical Nutrition Therapy  Appointment Start time:  [phone]  Appointment End time:  8502  Primary concerns today: DM Type 2  Referral diagnosis: E11.8 Preferred learning style: Auditory and hands on. Has visual limitations.  Learning readiness: Ready   NUTRITION ASSESSMENT  86 y old bfemale here for DM Education and help with her GERD.. Visually limited due to cataracts. PCP Amy Luciana Axe. "I need to learn what to eat and get my blood sugars under control. I have lost about 10 lbs in the last few months. FBS: 220's or 300's. Didn't bring meter. Is on Glipizide,Metformin, Victoza and Jardiance. Lost 20 lbs in 6 months and regained some and then lost about 10-15 in the last few months. Admits to increased urination, thirsty, weight loss, and fatigue. Wants to stay active. [place] and is getting PT for balance and mobility.  Willing to work on [place] and improving her health and food choices to manage her DM. She would like to be referred to Dr. Dorris Fetch for help with her DM.  Diet is inconsistent with meals and quantity of foods. Diet is low in fiber, whole grains and plant based foods. Doesn't drink much water and willing to improve that a lot.  Anthropometrics  Wt Readings from Last 3 Encounters:  05/15/22 131 lb (59.4 kg)  04/24/22 131 lb 9.6 oz (59.7 kg)  04/07/22 130 lb 9.6 oz (59.2 kg)   Ht Readings from Last 3 Encounters:  05/15/22 5' (1.524 m)  04/24/22 5' (1.524 m)  04/07/22 5' (1.524 m)   Body mass index is 25.58 kg/m. '@BMIFA'$ @ Facility age limit for growth %iles is 20 years. Facility age limit for growth %iles is 20 years.    Clinical Medical Hx: DM Type 2, HTN, Hyperlipidemia, AFIB, GERD Medications: Is on Glipizide,Metformin, Victoza and Jardiance. Labs: A1C 8.2%  Notable Signs/Symptoms: thirsty, weight loss, fatigue  Lifestyle & Dietary Hx Lives at home by herself. Cooks for herself. Has vision problems. Has glaucoma   Estimated daily fluid intake: 16  oz Supplements: VIt D Sleep: good Stress / self-care:  Current average weekly physical activity: ADL  24-Hr Dietary Recall  Eats 2-3 meals per day. Drinks diet sodas and a little water.   Estimated Energy Needs Calories: 1500 Carbohydrate: 170g Protein: 112g Fat: 42g   NUTRITION DIAGNOSIS  NB-1.1 Food and nutrition-related knowledge deficit As related to Diabetes Type 2.  As evidenced by A1C 8.2%. Has glaucoma and has difficulity seeing   NUTRITION INTERVENTION  Nutrition education (E-1) on the following topics:  Nutrition and Diabetes education provided on My Plate, CHO counting, meal planning, portion sizes, timing of meals, avoiding snacks between meals unless having a low blood sugar, target ranges for A1C and blood sugars, signs/symptoms and treatment of hyper/hypoglycemia, monitoring blood sugars, taking medications as prescribed, benefits of exercising 30 minutes per day and prevention of complications of DM.  Lifestyle Medicine  - Whole Food, Plant Predominant Nutrition is highly recommended: Eat Plenty of vegetables, Mushrooms, fruits, Legumes, Whole Grains, Nuts, seeds in lieu of processed meats, processed snacks/pastries red meat, poultry, eggs.    -It is better to avoid simple carbohydrates including: Cakes, Sweet Desserts, Ice Cream, Soda (diet and regular), Sweet Tea, Candies, Chips, Cookies, Store Bought Juices, Alcohol in Excess of  1-2 drinks a day, Lemonade,  Artificial Sweeteners, Doughnuts, Coffee Creamers, "Sugar-free" Products, etc, etc.  This is not a complete list.....  Exercise: If you are able: 30 -60 minutes a day ,4 days a week, or 150  minutes a week.  The longer the better.  Combine stretch, strength, and aerobic activities.  If you were told in the past that you have high risk for cardiovascular diseases, you may seek evaluation by your heart doctor prior to initiating moderate to intense exercise programs.  Diet for gastroesophageal reflux disease    Handouts Provided Include  Lifestyle Medicine Handouts GERD handouts Signs/symptoms of high and low blood sugars  Learning Style & Readiness for Change Teaching method utilized: Visual & Auditory  Demonstrated degree of understanding via: Teach Back  Barriers to learning/adherence to lifestyle change: vision some  Goals Established by Pt Goals  Eat three meals per day Increase fresh fruits and vegetables. Drink 4 bottles of water per day Check blood sugars 4 times per day Keep a food journal   MONITORING & EVALUATION Dietary intake, weekly physical activity, and blood sugars in 1 week.  Next Steps  Patient is to keep food journal and BS log and focus on plant based meals.Marland Kitchen

## 2022-05-21 DIAGNOSIS — M6281 Muscle weakness (generalized): Secondary | ICD-10-CM | POA: Diagnosis not present

## 2022-05-21 DIAGNOSIS — R262 Difficulty in walking, not elsewhere classified: Secondary | ICD-10-CM | POA: Diagnosis not present

## 2022-05-21 DIAGNOSIS — Z9181 History of falling: Secondary | ICD-10-CM | POA: Diagnosis not present

## 2022-05-22 ENCOUNTER — Ambulatory Visit: Payer: Medicare Other | Admitting: Nutrition

## 2022-05-28 DIAGNOSIS — Z9181 History of falling: Secondary | ICD-10-CM | POA: Diagnosis not present

## 2022-05-28 DIAGNOSIS — R262 Difficulty in walking, not elsewhere classified: Secondary | ICD-10-CM | POA: Diagnosis not present

## 2022-05-28 DIAGNOSIS — M6281 Muscle weakness (generalized): Secondary | ICD-10-CM | POA: Diagnosis not present

## 2022-06-11 DIAGNOSIS — R262 Difficulty in walking, not elsewhere classified: Secondary | ICD-10-CM | POA: Diagnosis not present

## 2022-06-11 DIAGNOSIS — M6281 Muscle weakness (generalized): Secondary | ICD-10-CM | POA: Diagnosis not present

## 2022-06-11 DIAGNOSIS — Z9181 History of falling: Secondary | ICD-10-CM | POA: Diagnosis not present

## 2022-06-12 DIAGNOSIS — E1122 Type 2 diabetes mellitus with diabetic chronic kidney disease: Secondary | ICD-10-CM | POA: Diagnosis not present

## 2022-06-12 DIAGNOSIS — E782 Mixed hyperlipidemia: Secondary | ICD-10-CM | POA: Diagnosis not present

## 2022-06-13 DIAGNOSIS — H401122 Primary open-angle glaucoma, left eye, moderate stage: Secondary | ICD-10-CM | POA: Diagnosis not present

## 2022-06-13 DIAGNOSIS — E113313 Type 2 diabetes mellitus with moderate nonproliferative diabetic retinopathy with macular edema, bilateral: Secondary | ICD-10-CM | POA: Diagnosis not present

## 2022-06-13 DIAGNOSIS — H401111 Primary open-angle glaucoma, right eye, mild stage: Secondary | ICD-10-CM | POA: Diagnosis not present

## 2022-06-13 DIAGNOSIS — H02881 Meibomian gland dysfunction right upper eyelid: Secondary | ICD-10-CM | POA: Diagnosis not present

## 2022-06-17 DIAGNOSIS — L089 Local infection of the skin and subcutaneous tissue, unspecified: Secondary | ICD-10-CM | POA: Diagnosis not present

## 2022-06-17 DIAGNOSIS — M2011 Hallux valgus (acquired), right foot: Secondary | ICD-10-CM | POA: Diagnosis not present

## 2022-06-17 DIAGNOSIS — Z8639 Personal history of other endocrine, nutritional and metabolic disease: Secondary | ICD-10-CM | POA: Diagnosis not present

## 2022-06-17 DIAGNOSIS — S99921D Unspecified injury of right foot, subsequent encounter: Secondary | ICD-10-CM | POA: Diagnosis not present

## 2022-06-17 DIAGNOSIS — M79671 Pain in right foot: Secondary | ICD-10-CM | POA: Diagnosis not present

## 2022-06-18 DIAGNOSIS — Z9181 History of falling: Secondary | ICD-10-CM | POA: Diagnosis not present

## 2022-06-18 DIAGNOSIS — R262 Difficulty in walking, not elsewhere classified: Secondary | ICD-10-CM | POA: Diagnosis not present

## 2022-06-18 DIAGNOSIS — M6281 Muscle weakness (generalized): Secondary | ICD-10-CM | POA: Diagnosis not present

## 2022-06-26 DIAGNOSIS — L89892 Pressure ulcer of other site, stage 2: Secondary | ICD-10-CM | POA: Diagnosis not present

## 2022-06-26 DIAGNOSIS — I10 Essential (primary) hypertension: Secondary | ICD-10-CM | POA: Diagnosis not present

## 2022-06-26 DIAGNOSIS — M79674 Pain in right toe(s): Secondary | ICD-10-CM | POA: Diagnosis not present

## 2022-06-26 DIAGNOSIS — E875 Hyperkalemia: Secondary | ICD-10-CM | POA: Diagnosis not present

## 2022-06-26 DIAGNOSIS — E7801 Familial hypercholesterolemia: Secondary | ICD-10-CM | POA: Diagnosis not present

## 2022-06-26 DIAGNOSIS — E1165 Type 2 diabetes mellitus with hyperglycemia: Secondary | ICD-10-CM | POA: Diagnosis not present

## 2022-06-26 DIAGNOSIS — M79671 Pain in right foot: Secondary | ICD-10-CM | POA: Diagnosis not present

## 2022-06-26 DIAGNOSIS — E782 Mixed hyperlipidemia: Secondary | ICD-10-CM | POA: Diagnosis not present

## 2022-06-26 DIAGNOSIS — E1142 Type 2 diabetes mellitus with diabetic polyneuropathy: Secondary | ICD-10-CM | POA: Diagnosis not present

## 2022-06-26 DIAGNOSIS — M2011 Hallux valgus (acquired), right foot: Secondary | ICD-10-CM | POA: Diagnosis not present

## 2022-06-26 DIAGNOSIS — E1122 Type 2 diabetes mellitus with diabetic chronic kidney disease: Secondary | ICD-10-CM | POA: Diagnosis not present

## 2022-06-26 DIAGNOSIS — E7849 Other hyperlipidemia: Secondary | ICD-10-CM | POA: Diagnosis not present

## 2022-06-26 DIAGNOSIS — E1151 Type 2 diabetes mellitus with diabetic peripheral angiopathy without gangrene: Secondary | ICD-10-CM | POA: Diagnosis not present

## 2022-06-26 DIAGNOSIS — E7800 Pure hypercholesterolemia, unspecified: Secondary | ICD-10-CM | POA: Diagnosis not present

## 2022-07-02 DIAGNOSIS — N184 Chronic kidney disease, stage 4 (severe): Secondary | ICD-10-CM | POA: Diagnosis not present

## 2022-07-02 DIAGNOSIS — I10 Essential (primary) hypertension: Secondary | ICD-10-CM | POA: Diagnosis not present

## 2022-07-02 DIAGNOSIS — R2689 Other abnormalities of gait and mobility: Secondary | ICD-10-CM | POA: Diagnosis not present

## 2022-07-02 DIAGNOSIS — E7849 Other hyperlipidemia: Secondary | ICD-10-CM | POA: Diagnosis not present

## 2022-07-02 DIAGNOSIS — E1122 Type 2 diabetes mellitus with diabetic chronic kidney disease: Secondary | ICD-10-CM | POA: Diagnosis not present

## 2022-07-02 DIAGNOSIS — J0101 Acute recurrent maxillary sinusitis: Secondary | ICD-10-CM | POA: Diagnosis not present

## 2022-07-02 DIAGNOSIS — E782 Mixed hyperlipidemia: Secondary | ICD-10-CM | POA: Diagnosis not present

## 2022-07-02 DIAGNOSIS — Z6823 Body mass index (BMI) 23.0-23.9, adult: Secondary | ICD-10-CM | POA: Diagnosis not present

## 2022-07-02 DIAGNOSIS — I482 Chronic atrial fibrillation, unspecified: Secondary | ICD-10-CM | POA: Diagnosis not present

## 2022-07-02 DIAGNOSIS — R748 Abnormal levels of other serum enzymes: Secondary | ICD-10-CM | POA: Diagnosis not present

## 2022-07-02 DIAGNOSIS — R944 Abnormal results of kidney function studies: Secondary | ICD-10-CM | POA: Diagnosis not present

## 2022-07-02 DIAGNOSIS — R911 Solitary pulmonary nodule: Secondary | ICD-10-CM | POA: Diagnosis not present

## 2022-07-02 DIAGNOSIS — E876 Hypokalemia: Secondary | ICD-10-CM | POA: Diagnosis not present

## 2022-07-02 DIAGNOSIS — C50911 Malignant neoplasm of unspecified site of right female breast: Secondary | ICD-10-CM | POA: Diagnosis not present

## 2022-07-17 DIAGNOSIS — R748 Abnormal levels of other serum enzymes: Secondary | ICD-10-CM | POA: Diagnosis not present

## 2022-07-17 DIAGNOSIS — R739 Hyperglycemia, unspecified: Secondary | ICD-10-CM | POA: Diagnosis not present

## 2022-07-18 DIAGNOSIS — E113313 Type 2 diabetes mellitus with moderate nonproliferative diabetic retinopathy with macular edema, bilateral: Secondary | ICD-10-CM | POA: Diagnosis not present

## 2022-07-18 DIAGNOSIS — H25812 Combined forms of age-related cataract, left eye: Secondary | ICD-10-CM | POA: Diagnosis not present

## 2022-07-18 DIAGNOSIS — H401111 Primary open-angle glaucoma, right eye, mild stage: Secondary | ICD-10-CM | POA: Diagnosis not present

## 2022-07-18 DIAGNOSIS — H401122 Primary open-angle glaucoma, left eye, moderate stage: Secondary | ICD-10-CM | POA: Diagnosis not present

## 2022-07-21 DIAGNOSIS — L97522 Non-pressure chronic ulcer of other part of left foot with fat layer exposed: Secondary | ICD-10-CM | POA: Diagnosis not present

## 2022-07-21 DIAGNOSIS — M79671 Pain in right foot: Secondary | ICD-10-CM | POA: Diagnosis not present

## 2022-07-21 DIAGNOSIS — E1151 Type 2 diabetes mellitus with diabetic peripheral angiopathy without gangrene: Secondary | ICD-10-CM | POA: Diagnosis not present

## 2022-07-21 DIAGNOSIS — L97511 Non-pressure chronic ulcer of other part of right foot limited to breakdown of skin: Secondary | ICD-10-CM | POA: Diagnosis not present

## 2022-07-21 DIAGNOSIS — E1142 Type 2 diabetes mellitus with diabetic polyneuropathy: Secondary | ICD-10-CM | POA: Diagnosis not present

## 2022-07-21 DIAGNOSIS — M2011 Hallux valgus (acquired), right foot: Secondary | ICD-10-CM | POA: Diagnosis not present

## 2022-07-21 DIAGNOSIS — L89892 Pressure ulcer of other site, stage 2: Secondary | ICD-10-CM | POA: Diagnosis not present

## 2022-07-21 DIAGNOSIS — M79674 Pain in right toe(s): Secondary | ICD-10-CM | POA: Diagnosis not present

## 2022-07-24 ENCOUNTER — Telehealth: Payer: Self-pay | Admitting: Internal Medicine

## 2022-07-24 DIAGNOSIS — R944 Abnormal results of kidney function studies: Secondary | ICD-10-CM | POA: Diagnosis not present

## 2022-07-24 DIAGNOSIS — E876 Hypokalemia: Secondary | ICD-10-CM | POA: Diagnosis not present

## 2022-07-24 DIAGNOSIS — N189 Chronic kidney disease, unspecified: Secondary | ICD-10-CM | POA: Diagnosis not present

## 2022-07-24 NOTE — Telephone Encounter (Signed)
Patient needed to reschedule her upcoming appt and was rescheduled for 2/16.  She asked is she to keep taking the medication for acid reflux.  Please advise.

## 2022-07-29 ENCOUNTER — Ambulatory Visit: Payer: Medicare Other | Admitting: Internal Medicine

## 2022-07-29 DIAGNOSIS — S91109A Unspecified open wound of unspecified toe(s) without damage to nail, initial encounter: Secondary | ICD-10-CM | POA: Diagnosis not present

## 2022-07-29 DIAGNOSIS — I743 Embolism and thrombosis of arteries of the lower extremities: Secondary | ICD-10-CM | POA: Diagnosis not present

## 2022-07-29 DIAGNOSIS — I7090 Unspecified atherosclerosis: Secondary | ICD-10-CM | POA: Diagnosis not present

## 2022-07-29 DIAGNOSIS — R0989 Other specified symptoms and signs involving the circulatory and respiratory systems: Secondary | ICD-10-CM | POA: Diagnosis not present

## 2022-08-01 ENCOUNTER — Ambulatory Visit (INDEPENDENT_AMBULATORY_CARE_PROVIDER_SITE_OTHER): Payer: Medicare Other | Admitting: Internal Medicine

## 2022-08-01 VITALS — BP 200/123 | HR 87 | Temp 97.9°F | Ht 60.0 in | Wt 132.6 lb

## 2022-08-01 DIAGNOSIS — K5900 Constipation, unspecified: Secondary | ICD-10-CM

## 2022-08-01 DIAGNOSIS — R1033 Periumbilical pain: Secondary | ICD-10-CM | POA: Diagnosis not present

## 2022-08-01 DIAGNOSIS — K219 Gastro-esophageal reflux disease without esophagitis: Secondary | ICD-10-CM | POA: Diagnosis not present

## 2022-08-01 NOTE — Progress Notes (Signed)
Primary Care Physician:  Riley Lam Primary Gastroenterologist:  Dr.   Pre-Procedure History & Physical: HPI:  Betty Goodman is a 77 y.o. female here for follow-up of abdominal pain/dyspepsia and GERD.  We saw her 3 months ago.  Started on Protonix 40 mg orally daily.  She returns today stating she is markedly improved, in fact, her presenting symptoms have resolved.  No abdominal pain, no nausea or vomiting GERD well-controlled.  No dysphagia.  She is gained a pound since she was last seen here.  Diverticulosis on 2021 colonoscopy (done elsewhere). Very happy with her progress. Blood pressure is running high today.  Past Medical History:  Diagnosis Date   Atrial fibrillation [facility])    Kidney stone    resulting in stenting   Type 2 diabetes mellitus ([place])    Unspecified essential hypertension     Past Surgical History:  Procedure Laterality Date   ADENOIDECTOMY     COLONOSCOPY  08/17/2019   Dr. Rocco Serene; Mild nonbleeding diverticulosis in the left colon. Recommended consider repeat in 5-10 years given questionable maternal grandmother with colon cancer   TONSILLECTOMY     TUBAL LIGATION      Prior to Admission medications   Medication Sig Start Date End Date Taking? Authorizing Provider  alendronate (FOSAMAX) 70 MG tablet Take 70 mg by mouth once a week. Take with a full glass of water on an empty stomach.   Yes [provider]  apixaban (ELIQUIS) 5 MG TABS tablet Take 1 tablet (5 mg total) by mouth 2 (two) times daily. 08/03/18  Yes Herminio Commons, MD  atorvastatin (LIPITOR) 40 MG tablet Take 40 mg by mouth at bedtime.   Yes [provider]  brimonidine-timolol (COMBIGAN) 0.2-0.5 % ophthalmic solution Place 1 drop into both eyes every 12 (twelve) hours.   Yes [provider]  carvedilol (COREG CR) 20 MG 24 hr capsule Take 20 mg by mouth daily.   Yes [provider]  Cholecalciferol (VITAMIN D) 50 MCG (2000 UT) CAPS Take 2  capsules by mouth 2 (two) times daily.   Yes [provider]  diltiazem (CARDIZEM CD) 360 MG 24 hr capsule Take 360 mg by mouth daily.   Yes [provider]  dorzolamide (TRUSOPT) 2 % ophthalmic solution Place 1 drop into the left eye 2 (two) times daily. 07/18/22  Yes [provider]  empagliflozin (JARDIANCE) 25 MG TABS tablet Take 25 mg by mouth daily.   Yes [provider]  fluticasone (FLONASE) 50 MCG/ACT nasal spray Place 2 sprays into the nose daily.   Yes [provider]  glipiZIDE (GLUCOTROL) 10 MG tablet Take 10 mg by mouth daily before breakfast.   Yes [provider]  ketorolac (ACULAR) 0.5 % ophthalmic solution 1 drop 4 (four) times daily. 06/13/22  Yes [provider]  letrozole (FEMARA) 2.5 MG tablet Take 2.5 mg by mouth daily. 08/08/20  Yes [provider]  Liraglutide (VICTOZA [place]) Inject 1.8 mg into the skin daily.    Yes [provider]  loratadine (CLARITIN) 10 MG tablet Take 10 mg by mouth daily.   Yes [provider]  losartan (COZAAR) 100 MG tablet Take 100 mg by mouth daily.   Yes [provider]  metFORMIN (GLUCOPHAGE-XR) 500 MG 24 hr tablet Take 1,000 mg by mouth 2 (two) times daily. 04/02/22  Yes [provider]  pantoprazole (PROTONIX) 40 MG tablet Take 1 tablet (40 mg total) by mouth daily.  04/24/22  Yes Erenest Rasher, PA-C  spironolactone (ALDACTONE) 25 MG tablet Take 1 tablet (25 mg total) by mouth daily. 04/07/22 10/04/22 Yes BranchAlphonse Guild, MD    Allergies as of 08/01/2022 - Review Complete 08/01/2022  Allergen Reaction Noted   Sulfa antibiotics  12/26/2011    Family History  Problem Relation Age of Onset   Cancer Mother        Liver   Leukemia Sister    Colon cancer Neg Hx     Social History   Socioeconomic History   Marital status: Widowed    Spouse name: Not on file   Number of children: 4   Years of education: Not on file   Highest  education level: Not on file  Occupational History   Occupation: Retired  Tobacco Use   Smoking status: Never   Smokeless tobacco: Never  Vaping Use   Vaping Use: Never used  Substance and Sexual Activity   Alcohol use: No   Drug use: Not on file   Sexual activity: Not on file  Other Topics Concern   Not on file  Social History Narrative   Lives with husband one daughter and two grandchildren.   Social Determinants of Health   Financial Resource Strain: Not on file  Food Insecurity: Not on file  Transportation Needs: Not on file  Physical Activity: Not on file  Stress: Not on file  Social Connections: Not on file  Intimate Partner Violence: Not on file    Review of Systems: See HPI, otherwise negative ROS  Physical Exam: BP (!) 189/119 (BP Location: Right Arm, Patient Position: Sitting, Cuff Size: Normal)   Pulse 87   Temp 97.9 F (36.6 C) (Oral)   Ht 5' (1.524 m)   Wt 132 lb 9.6 oz (60.1 kg)   SpO2 98%   BMI 25.90 kg/m  General:   Alert,  pleasant and cooperative in NAD  Impression/Plan: Very pleasant 81 lady presented with dyspepsia upper abdominal pain and GERD recently.  She has responded nicely to Protonix 40 mg once daily.  Not mentioned above, her constipation is well-managed with MiraLAX.  She is very happy.  Her blood pressure significantly elevated today. At this time, she is clinically doing well I do not feel that any further GI evaluation is warranted at this time.  Recommendations:  I am glad you are doing so much better on Protonix 40 mg daily  This is a safe medication to take on a regular basis.  It is best taken 30 minutes before breakfast every day  Your blood pressure is running high today. Have [facility] at [facility] between now and the first of the week    . Notice: This dictation was prepared with Dragon dictation along with smaller phrase technology. Any transcriptional errors that result from this process are unintentional and may  not be corrected upon review.

## 2022-08-01 NOTE — Patient Instructions (Signed)
It was nice to meet you today!  I am glad you are doing so much better on Protonix 40 mg daily  This is a safe medication to take on a regular basis.  It is best taken 30 minutes before breakfast every day  Your blood pressure is running high today as discussed.  I would have it rechecked at [facility] between now and the first of the week  Unless something comes up in the interim, we will plan to see you back in 1 year.  Please do not hesitate to call if you have any concerns between now and your next office appointment

## 2022-08-04 DIAGNOSIS — I10 Essential (primary) hypertension: Secondary | ICD-10-CM | POA: Diagnosis not present

## 2022-08-05 ENCOUNTER — Encounter: Payer: Self-pay | Admitting: Nurse Practitioner

## 2022-08-05 ENCOUNTER — Ambulatory Visit: Payer: Medicare Other | Attending: Nurse Practitioner | Admitting: Nurse Practitioner

## 2022-08-05 VITALS — BP 177/101 | HR 74 | Ht 60.0 in | Wt 130.0 lb

## 2022-08-05 DIAGNOSIS — Z79899 Other long term (current) drug therapy: Secondary | ICD-10-CM | POA: Diagnosis not present

## 2022-08-05 DIAGNOSIS — I272 Pulmonary hypertension, unspecified: Secondary | ICD-10-CM | POA: Diagnosis not present

## 2022-08-05 DIAGNOSIS — E11621 Type 2 diabetes mellitus with foot ulcer: Secondary | ICD-10-CM | POA: Diagnosis not present

## 2022-08-05 DIAGNOSIS — I4891 Unspecified atrial fibrillation: Secondary | ICD-10-CM

## 2022-08-05 DIAGNOSIS — I10 Essential (primary) hypertension: Secondary | ICD-10-CM | POA: Diagnosis not present

## 2022-08-05 DIAGNOSIS — L97519 Non-pressure chronic ulcer of other part of right foot with unspecified severity: Secondary | ICD-10-CM

## 2022-08-05 DIAGNOSIS — G4733 Obstructive sleep apnea (adult) (pediatric): Secondary | ICD-10-CM | POA: Diagnosis not present

## 2022-08-05 DIAGNOSIS — I739 Peripheral vascular disease, unspecified: Secondary | ICD-10-CM

## 2022-08-05 MED ORDER — SPIRONOLACTONE 50 MG PO TABS: 50.0000 mg | ORAL_TABLET | Freq: Every day | ORAL | 1 refills | Status: DC

## 2022-08-05 MED ORDER — SPIRONOLACTONE 50 MG PO TABS: 50.0000 mg | ORAL_TABLET | Freq: Every day | ORAL | 0 refills | Status: DC

## 2022-08-05 NOTE — Progress Notes (Unsigned)
Cardiology Office Note:    Date:  08/07/2022   ID:  Betty Goodman, DOB December 23, 1945, MRN [id number]  PCP:  Riley Lam   [place] Providers Cardiologist:  Carlyle Dolly, MD     Referring MD: Jalene Mullet, PA-C   CC: elevated BP  History of Present Illness:    Betty Goodman is a 77 y.o. female with a hx of the following:  A-fib Hypertension Pulmonary hypertension Type 2 diabetes Breast cancer OSA PAD, foot ulcers   Patient is a very pleasant 77 year old female with past medical history as mentioned above.  In 2021, echocardiogram reported PASP in 80s.  The following year echocardiogram revealed EF 60 to 65%, normal RV, severe BAE, moderate TR, PASP is 32.  Severe BAE would suggest significant diastolic dysfunction.  PFTs were unremarkable, negative serologies.  Has history of not wanting to wear CPAP in the past. Presented to [facility] in September 2023 for chest pain.  Workup was overall negative.  Last seen by Dr. Carlyle Dolly on April 07, 2022.  Denied any palpitations, was compliant with her medications.  Her PCP had added chlorthalidone daily a few months back.  Dr. Harl Bowie started her on Aldactone 25 mg daily.  Would repeat BMET and magnesium in 2 weeks.  Was told to follow-up in 6 months.  She recently saw her GI doctor last week and blood pressure was found to be significantly elevated.  It was recommended she follow-up with cardiology.  Today she presents for follow-up.  She states she had ultrasound performed of her lower extremities with [facility] last week. ABI's revealed severe bilateral lower extremity arterial occlusive disease. Says she will be seeing a doctor soon for this. Says she has some right foot ulcers. Says her BP is up, and admits to recent stressors. Denies any chest pain, shortness of breath, palpitations, syncope, presyncope, dizziness, orthopnea, PND, swelling or significant weight changes, acute bleeding, or  claudication. Has OSA but does not wear a CPAP. Denies any other questions or concerns.    Past Medical History:  Diagnosis Date   Atrial fibrillation [facility])    Kidney stone    resulting in stenting   Type 2 diabetes mellitus ([place])    Unspecified essential hypertension     Past Surgical History:  Procedure Laterality Date   ADENOIDECTOMY     COLONOSCOPY  08/17/2019   Dr. Rocco Serene; Mild nonbleeding diverticulosis in the left colon. Recommended consider repeat in 5-10 years given questionable maternal grandmother with colon cancer   TONSILLECTOMY     TUBAL LIGATION      Current Medications: Current Meds  Medication Sig   alendronate (FOSAMAX) 70 MG tablet Take 70 mg by mouth once a week. Take with a full glass of water on an empty stomach.   apixaban (ELIQUIS) 5 MG TABS tablet Take 1 tablet (5 mg total) by mouth 2 (two) times daily.   atorvastatin (LIPITOR) 40 MG tablet Take 40 mg by mouth at bedtime.   brimonidine-timolol (COMBIGAN) 0.2-0.5 % ophthalmic solution Place 1 drop into both eyes every 12 (twelve) hours.   carvedilol (COREG CR) 20 MG 24 hr capsule Take 20 mg by mouth daily.   Cholecalciferol (VITAMIN D) 50 MCG (2000 UT) CAPS Take 2 capsules by mouth 2 (two) times daily.   diltiazem (CARDIZEM CD) 360 MG 24 hr capsule Take 360 mg by mouth daily.   dorzolamide (TRUSOPT) 2 % ophthalmic solution Place 1 drop into the left eye  2 (two) times daily.   empagliflozin (JARDIANCE) 25 MG TABS tablet Take 25 mg by mouth daily.   fluticasone (FLONASE) 50 MCG/ACT nasal spray Place 2 sprays into the nose daily.   glipiZIDE (GLUCOTROL) 10 MG tablet Take 10 mg by mouth daily before breakfast.   ketorolac (ACULAR) 0.5 % ophthalmic solution 1 drop 4 (four) times daily.   letrozole (FEMARA) 2.5 MG tablet Take 2.5 mg by mouth daily.   Liraglutide (VICTOZA [place]) Inject 1.8 mg into the skin daily.    loratadine (CLARITIN) 10 MG tablet Take 10 mg by mouth daily.   losartan (COZAAR) 100 MG tablet  Take 100 mg by mouth daily.   metFORMIN (GLUCOPHAGE-XR) 500 MG 24 hr tablet Take 1,000 mg by mouth 2 (two) times daily.   pantoprazole (PROTONIX) 40 MG tablet Take 1 tablet (40 mg total) by mouth daily.   spironolactone (ALDACTONE) 25 MG tablet Take 1 tablet (25 mg total) by mouth daily.     Allergies:   Sulfa antibiotics   Social History   Socioeconomic History   Marital status: Widowed    Spouse name: Not on file   Number of children: 4   Years of education: Not on file   Highest education level: Not on file  Occupational History   Occupation: Retired  Tobacco Use   Smoking status: Never   Smokeless tobacco: Never  Vaping Use   Vaping Use: Never used  Substance and Sexual Activity   Alcohol use: No   Drug use: Not on file   Sexual activity: Not on file  Other Topics Concern   Not on file  Social History Narrative   Lives with husband one daughter and two grandchildren.   Social Determinants of Health   Financial Resource Strain: Not on file  Food Insecurity: Not on file  Transportation Needs: Not on file  Physical Activity: Not on file  Stress: Not on file  Social Connections: Not on file     Family History: The patient's family history includes Cancer in her mother; Leukemia in her sister. There is no history of Colon cancer.  ROS:   Review of Systems  Constitutional: Negative.   HENT: Negative.    Eyes: Negative.   Respiratory: Negative.    Cardiovascular: Negative.   Gastrointestinal: Negative.   Genitourinary: Negative.   Musculoskeletal: Negative.   Skin: Negative.   Neurological: Negative.   Endo/Heme/Allergies: Negative.   Psychiatric/Behavioral:  Negative for depression, hallucinations, memory loss, substance abuse and suicidal ideas. The patient is nervous/anxious. The patient does not have insomnia.     Please see the history of present illness.    All other systems reviewed and are negative.  EKGs/Labs/Other Studies Reviewed:    The  following studies were reviewed today:   EKG:  EKG is not ordered today.     Echocardiogram on 11/06/2020:  1. Left ventricular ejection fraction, by estimation, is 60 to 65%. The  left ventricle has normal function. The left ventricle has no regional  wall motion abnormalities. There is severe left ventricular hypertrophy.  Left ventricular diastolic parameters   are indeterminate.   2. Right ventricular systolic function is normal. The right ventricular  size is normal.   3. Left atrial size was severely dilated.   4. Right atrial size was severely dilated.   5. The mitral valve is normal in structure. Trivial mitral valve  regurgitation. No evidence of mitral stenosis.   6. The tricuspid valve is abnormal. Tricuspid valve regurgitation is  moderate.   7. The aortic valve is tricuspid. There is mild calcification of the  aortic valve. There is mild thickening of the aortic valve. Aortic valve  regurgitation is not visualized. No aortic stenosis is present.   8. Mild pulmonary HTN, PASP is 32 mmHg.   9. The inferior vena cava is normal in size with greater than 50%  respiratory variability, suggesting right atrial pressure of 3 mmHg.   Comparison(s): Echocardiogram done 10/20/19 showed an EF of 65-70%.   Recent Labs: No results found for requested labs within last 365 days.  Recent Lipid Panel No results found for: "CHOL", "TRIG", "HDL", "CHOLHDL", "VLDL", "LDLCALC", "LDLDIRECT"   Risk Assessment/Calculations:    CHA2DS2-VASc Score = 6   This indicates a 9.7% annual risk of stroke. The patient's score is based upon: CHF History: 0 HTN History: 1 Diabetes History: 1 Stroke History: 0 Vascular Disease History: 1 Age Score: 2 Gender Score: 1   Physical Exam:    VS:  BP (!) 177/101 (BP Location: Left Arm, Patient Position: Sitting, Cuff Size: Normal)   Pulse 74   Ht 5' (1.524 m)   Wt 130 lb (59 kg)   SpO2 96%   BMI 25.39 kg/m     Wt Readings from Last 3  Encounters:  08/05/22 130 lb (59 kg)  08/01/22 132 lb 9.6 oz (60.1 kg)  05/15/22 131 lb (59.4 kg)     GEN: Well nourished, well developed in no acute distress HEENT: Normal NECK: No JVD; No carotid bruits CARDIAC: S1/S2, RRR, no murmurs, rubs, gallops; 2+ pulses except 1+ PT pulse along right foot RESPIRATORY:  Clear to auscultation without rales, wheezing or rhonchi  ABDOMEN: Soft, non-tender, non-distended MUSCULOSKELETAL:  No edema; No deformity  SKIN: Right foot diabetic ulcers noted along right great toe and foot, Warm and dry NEUROLOGIC:  Alert and oriented x 3 PSYCHIATRIC:  Normal affect   ASSESSMENT:    1. Essential hypertension   2. Medication management   3. Atrial fibrillation, unspecified type ([place])   4. Pulmonary hypertension ([place])   5. OSA (obstructive sleep apnea)   6. PAD (peripheral artery disease) ([place])   7. Diabetic ulcer of right foot associated with type 2 diabetes mellitus, unspecified part of foot, unspecified ulcer stage ([place])    PLAN:    In order of problems listed above:  HTN, medication management Poorly controlled BP today. BP on arrival, 188/114. Repeat BP by me 177/101. Does admit to stress. Compliant with her medications. Will increase Spironolactone to 50 mg daily and repeat Mag level and BMET in 2 weeks. Will arrange renal artery [place] to r/o secondary causes for OSA. Continue carvedilol, diltiazem, and losartan. Discussed to monitor BP at home at least 2 hours after medications and sitting for 5-10 minutes. Given BP log. Recommended to obtain OMRON cuff. [place] diet sheet given. Heart healthy diet and regular cardiovascular exercise encouraged.   2. A-fib Denies any tachycardia or palpitations. Continue current medication regimen. Currently on appropriate dosage of Eliquis. Denies any bleeding issues. Heart healthy diet and regular cardiovascular exercise encouraged.   3. Pulmonary HTN, OSA Most recent echo revealed mild pulmonary HTN, PASP was  32 mmHg.  This is likely secondary to her untreated OSA and diastolic dysfunction.  Increasing Aldactone as mentioned above.  Continue current medication regimen.  Will refer to pulmonology for history of pulmonary hypertension and untreated OSA.  4.  PAD, foot ulcers Had recent ABI's performed of her lower extremities with [facility] last week. ABI's revealed severe bilateral lower extremity arterial occlusive disease. Has been referred to a doctor and will be seeing a doctor soon for this, this is being managed. Says she has some right foot ulcers, they appear to be diabetic. Continue to follow with PCP. A previous MD told her how to dress these. If no improvement with diabetic foot ulcers by next OV, plan to refer to local wound care clinic.   5.  Disposition: Follow-up with me or APP in 3 to 4 weeks or sooner if anything changes.  Medication Adjustments/Labs and Tests Ordered: Current medicines are reviewed at length with the patient today.  Concerns regarding medicines are outlined above.  Orders Placed This Encounter  Procedures   Basic metabolic panel   Magnesium   Ambulatory referral to Pulmonology   VAS US RENAL ARTERY DUPLEX   Meds ordered this encounter  Medications   DISCONTD: spironolactone (ALDACTONE) 50 MG tablet    Sig: Take 1 tablet (50 mg total) by mouth daily.    Dispense:  30 tablet    Refill:  0    Dose increased 08/05/2022   spironolactone (ALDACTONE) 50 MG tablet    Sig: Take 1 tablet (50 mg total) by mouth daily.    Dispense:  90 tablet    Refill:  1    Dose increased 08/05/2022    Patient Instructions  Medication Instructions:  Increase Aldactone to '50mg'$  daily  Continue all other medications.     Labwork: BMET, Mg - orders given today Please do in 1 weeks   Testing/Procedures: Your physician has requested that you have a renal artery duplex. During this test, an ultrasound is used to evaluate blood flow to the kidneys. Allow one hour for this exam.  Do not eat after midnight the day before and avoid carbonated beverages. Take your medications as you usually do.   Follow-Up: 3-4 weeks   Any Other Special Instructions Will Be Listed Below (If Applicable). You have been referred to Pulmonology  [place] info given today  BP log   If you need a refill on your cardiac medications before your next appointment, please call your pharmacy.    Signed, Finis Bud, NP  08/07/2022 1:15 PM    [place]

## 2022-08-05 NOTE — Patient Instructions (Addendum)
Medication Instructions:  Increase Aldactone to '50mg'$  daily  Continue all other medications.     Labwork: BMET, Mg - orders given today Please do in 1 weeks   Testing/Procedures: Your physician has requested that you have a renal artery duplex. During this test, an ultrasound is used to evaluate blood flow to the kidneys. Allow one hour for this exam. Do not eat after midnight the day before and avoid carbonated beverages. Take your medications as you usually do.   Follow-Up: 3-4 weeks   Any Other Special Instructions Will Be Listed Below (If Applicable). You have been referred to Pulmonology  [place] info given today  BP log   If you need a refill on your cardiac medications before your next appointment, please call your pharmacy.

## 2022-08-07 ENCOUNTER — Encounter: Payer: Self-pay | Admitting: Nurse Practitioner

## 2022-08-08 DIAGNOSIS — Z79899 Other long term (current) drug therapy: Secondary | ICD-10-CM | POA: Diagnosis not present

## 2022-08-08 DIAGNOSIS — M79669 Pain in unspecified lower leg: Secondary | ICD-10-CM | POA: Diagnosis not present

## 2022-08-08 DIAGNOSIS — I10 Essential (primary) hypertension: Secondary | ICD-10-CM | POA: Diagnosis not present

## 2022-08-08 DIAGNOSIS — G4733 Obstructive sleep apnea (adult) (pediatric): Secondary | ICD-10-CM | POA: Diagnosis not present

## 2022-08-12 ENCOUNTER — Telehealth: Payer: Self-pay | Admitting: Nurse Practitioner

## 2022-08-12 NOTE — Telephone Encounter (Signed)
FYI per patient's request.  Patient called stating that she saw a Podiatrist on Friday 08/08/2022. Podiatrist recommended that she see a Vascular doctor in regards to low pulse in her foot. She was referred to Dr. Gerrit Friends, [place]. Patient is having a procedure on 08/16/2022 at the facility.

## 2022-08-16 DIAGNOSIS — I70235 Atherosclerosis of native arteries of right leg with ulceration of other part of foot: Secondary | ICD-10-CM | POA: Diagnosis not present

## 2022-08-20 DIAGNOSIS — I70245 Atherosclerosis of native arteries of left leg with ulceration of other part of foot: Secondary | ICD-10-CM | POA: Diagnosis not present

## 2022-08-20 DIAGNOSIS — I70235 Atherosclerosis of native arteries of right leg with ulceration of other part of foot: Secondary | ICD-10-CM | POA: Diagnosis not present

## 2022-08-22 DIAGNOSIS — Z6823 Body mass index (BMI) 23.0-23.9, adult: Secondary | ICD-10-CM | POA: Diagnosis not present

## 2022-08-22 DIAGNOSIS — L03115 Cellulitis of right lower limb: Secondary | ICD-10-CM | POA: Diagnosis not present

## 2022-08-22 DIAGNOSIS — R030 Elevated blood-pressure reading, without diagnosis of hypertension: Secondary | ICD-10-CM | POA: Diagnosis not present

## 2022-08-25 DIAGNOSIS — I739 Peripheral vascular disease, unspecified: Secondary | ICD-10-CM | POA: Diagnosis not present

## 2022-08-25 DIAGNOSIS — L97511 Non-pressure chronic ulcer of other part of right foot limited to breakdown of skin: Secondary | ICD-10-CM | POA: Diagnosis not present

## 2022-08-25 DIAGNOSIS — E1142 Type 2 diabetes mellitus with diabetic polyneuropathy: Secondary | ICD-10-CM | POA: Diagnosis not present

## 2022-08-25 IMAGING — DX DG CHEST 2V
2 series · 2 of 2 positions shown · non-contrast
Comparison: Chest x-ray 09/21/2019, comparison chest 01/24/2025

CLINICAL DATA: Pulmonary hypertension, shortness of breath,
negative COVID test.

EXAM:
CHEST - 2 VIEW

[chest pa]
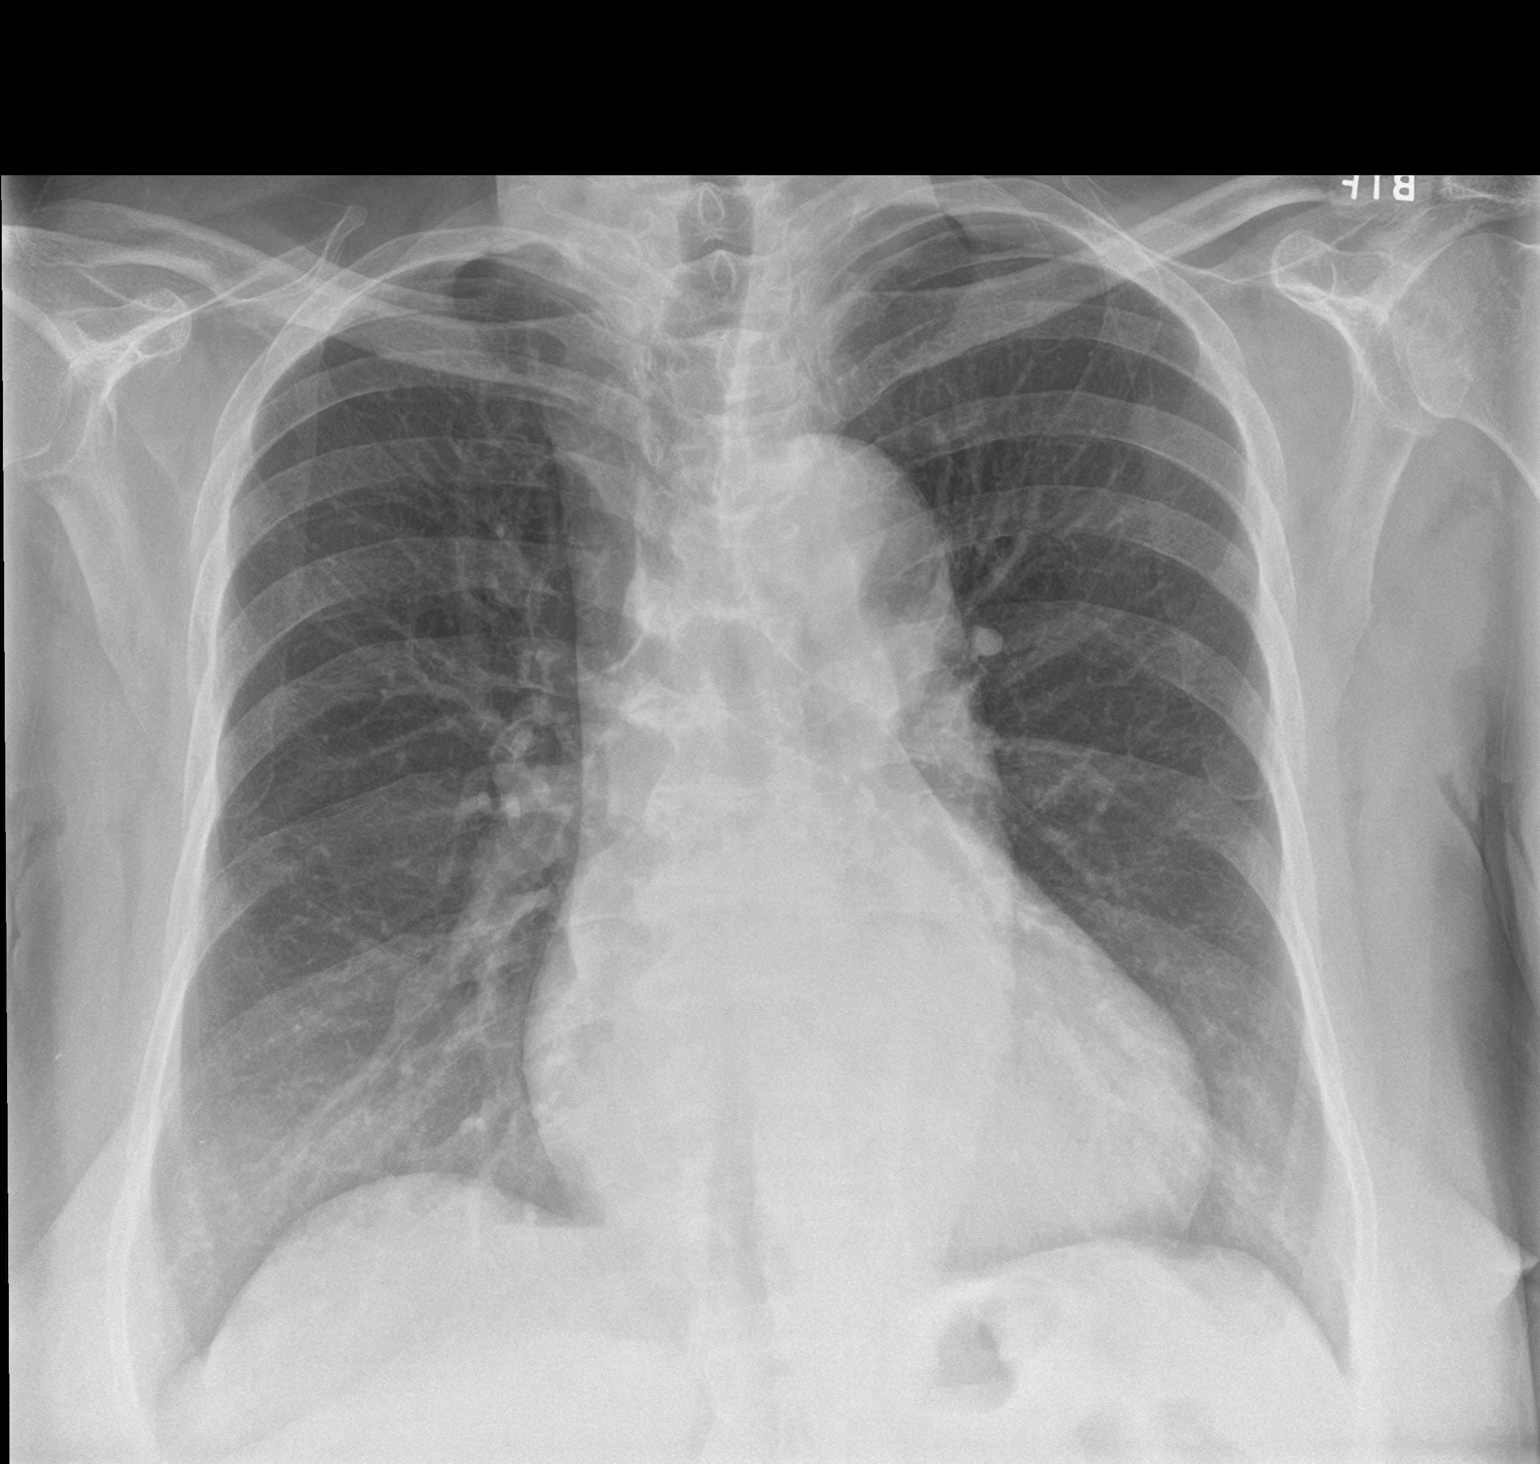

[chest lat]
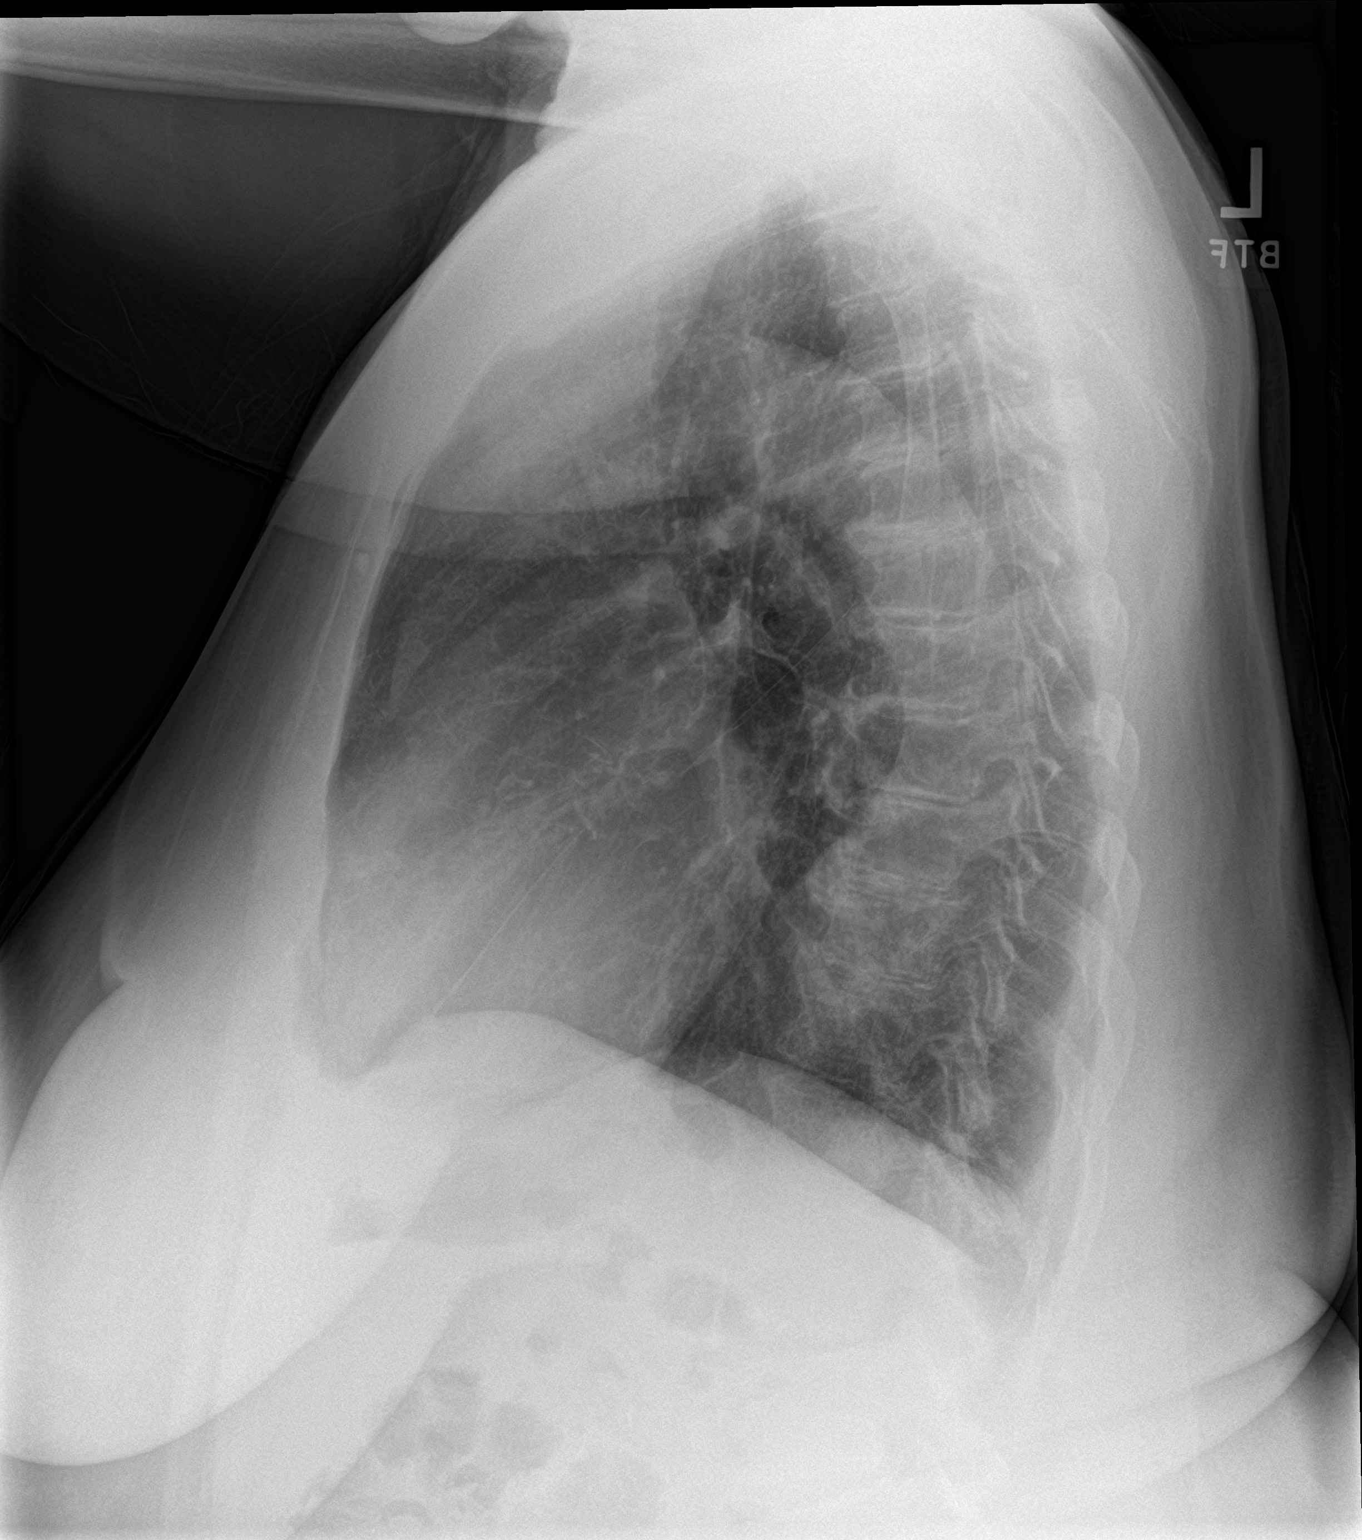

[2 of 2 positions shown; findings below may reference images not displayed]

FINDINGS: The heart size and mediastinal contours are unchanged.

Redemonstration of biapical pleural/pulmonary scarring. No focal
consolidation. No pulmonary edema. No pleural effusion. No
pneumothorax.

No acute osseous abnormality.
IMPRESSION: No active cardiopulmonary disease.

## 2022-08-27 DIAGNOSIS — E113313 Type 2 diabetes mellitus with moderate nonproliferative diabetic retinopathy with macular edema, bilateral: Secondary | ICD-10-CM | POA: Diagnosis not present

## 2022-08-27 DIAGNOSIS — H25812 Combined forms of age-related cataract, left eye: Secondary | ICD-10-CM | POA: Diagnosis not present

## 2022-08-27 DIAGNOSIS — H401122 Primary open-angle glaucoma, left eye, moderate stage: Secondary | ICD-10-CM | POA: Diagnosis not present

## 2022-08-27 DIAGNOSIS — H401111 Primary open-angle glaucoma, right eye, mild stage: Secondary | ICD-10-CM | POA: Diagnosis not present

## 2022-08-29 ENCOUNTER — Ambulatory Visit: Payer: Medicare Other | Admitting: Internal Medicine

## 2022-09-04 ENCOUNTER — Encounter: Payer: Medicare Other | Attending: Nurse Practitioner

## 2022-09-04 DIAGNOSIS — I10 Essential (primary) hypertension: Secondary | ICD-10-CM

## 2022-09-07 DIAGNOSIS — I70245 Atherosclerosis of native arteries of left leg with ulceration of other part of foot: Secondary | ICD-10-CM | POA: Diagnosis not present

## 2022-09-08 ENCOUNTER — Ambulatory Visit: Payer: Medicare Other | Admitting: Nurse Practitioner

## 2022-09-11 DIAGNOSIS — I70235 Atherosclerosis of native arteries of right leg with ulceration of other part of foot: Secondary | ICD-10-CM | POA: Diagnosis not present

## 2022-09-11 DIAGNOSIS — I70245 Atherosclerosis of native arteries of left leg with ulceration of other part of foot: Secondary | ICD-10-CM | POA: Diagnosis not present

## 2022-09-15 ENCOUNTER — Ambulatory Visit: Payer: Medicare Other | Attending: Nurse Practitioner | Admitting: Nurse Practitioner

## 2022-09-15 ENCOUNTER — Telehealth: Payer: Self-pay

## 2022-09-15 ENCOUNTER — Encounter: Payer: Self-pay | Admitting: Nurse Practitioner

## 2022-09-15 VITALS — BP 160/101 | HR 95 | Ht 60.0 in | Wt 131.8 lb

## 2022-09-15 DIAGNOSIS — I1A0 Resistant hypertension: Secondary | ICD-10-CM | POA: Insufficient documentation

## 2022-09-15 DIAGNOSIS — L97519 Non-pressure chronic ulcer of other part of right foot with unspecified severity: Secondary | ICD-10-CM | POA: Diagnosis not present

## 2022-09-15 DIAGNOSIS — I739 Peripheral vascular disease, unspecified: Secondary | ICD-10-CM | POA: Diagnosis not present

## 2022-09-15 DIAGNOSIS — G4733 Obstructive sleep apnea (adult) (pediatric): Secondary | ICD-10-CM | POA: Diagnosis not present

## 2022-09-15 DIAGNOSIS — I272 Pulmonary hypertension, unspecified: Secondary | ICD-10-CM | POA: Diagnosis not present

## 2022-09-15 DIAGNOSIS — Z79899 Other long term (current) drug therapy: Secondary | ICD-10-CM | POA: Diagnosis not present

## 2022-09-15 DIAGNOSIS — I4891 Unspecified atrial fibrillation: Secondary | ICD-10-CM | POA: Diagnosis not present

## 2022-09-15 DIAGNOSIS — E11621 Type 2 diabetes mellitus with foot ulcer: Secondary | ICD-10-CM | POA: Insufficient documentation

## 2022-09-15 MED ORDER — CARVEDILOL PHOSPHATE ER 40 MG PO CP24: 40.0000 mg | ORAL_CAPSULE | Freq: Every day | ORAL | 1 refills | Status: DC

## 2022-09-15 NOTE — Patient Instructions (Addendum)
Medication Instructions:  INCREASE Coreg to 40 mg once a day Continue all other medications as directed  Labwork: BMET 1-2 weeks at [place]  Testing/Procedures: none  Follow-Up:  Your physician recommends that you schedule a follow-up appointment in: 4-6 weeks  Any Other Special Instructions Will Be Listed Below (If Applicable).  You have been referred to [facility]  If you need a refill on your cardiac medications before your next appointment, please call your pharmacy.

## 2022-09-15 NOTE — Progress Notes (Signed)
Cardiology Office Note:    Date:  09/15/2022   ID:  Betty Goodman, DOB [birth year], MRN [id number]  PCP:  Betty Goodman   [place] [facility] Cardiologist:  Carlyle Dolly, MD     Referring MD: Jalene Mullet, PA-C   CC: Elevated BP follow-up  History of Present Illness:    Betty Goodman is a 77 y.o. female with a hx of the following:  A-fib Resistant hypertension Pulmonary hypertension Type 2 diabetes Breast cancer OSA PAD, foot ulcers   Patient is a very pleasant 77 year old female with past medical history as mentioned above.  In 2021, TTE reported PASP in 80s.  The following year TTE revealed EF 60 to 65%, normal RV, severe BAE, moderate TR, PASP is 32.  Severe BAE would suggest significant diastolic dysfunction.  PFTs were unremarkable, negative serologies.  Has history of not wanting to wear CPAP in the past. Presented to [facility] in September 2023 for chest pain.  Workup was overall negative.  Was at her GI doctor when BP was found to be significantly elevated.  It was recommended she follow-up with cardiology. ABI's revealed severe bilateral lower extremity arterial occlusive disease.   At last OV, BP elevated, attributed this to stress. Was planning on seeing vascular MD for PAD and noted right foot ulcers. Denied wearing CPAP for OSA. No RAS noted on vascular US.   Today she presents for follow-up. Doing well from a cardiac perspective. Seeing Dr. Levander Campion at [facility] in [place], [place], s/p procedure on 08/16/2022; however I do not have these records. BP still remains elevated. Denies any chest pain, shortness of breath, palpitations, syncope, presyncope, dizziness, orthopnea, PND, swelling or significant weight changes, or acute bleeding. Compliant with her medications.   Past Medical History:  Diagnosis Date   Atrial fibrillation [facility])    Kidney stone    resulting in stenting   Type 2 diabetes mellitus ([place])    Unspecified essential  hypertension     Past Surgical History:  Procedure Laterality Date   ADENOIDECTOMY     COLONOSCOPY  08/17/2019   Dr. Rocco Serene; Mild nonbleeding diverticulosis in the left colon. Recommended consider repeat in 5-10 years given questionable maternal grandmother with colon cancer   TONSILLECTOMY     TUBAL LIGATION      Current Medications: Current Meds  Medication Sig   alendronate (FOSAMAX) 70 MG tablet Take 70 mg by mouth once a week. Take with a full glass of water on an empty stomach.   apixaban (ELIQUIS) 5 MG TABS tablet Take 1 tablet (5 mg total) by mouth 2 (two) times daily.   atorvastatin (LIPITOR) 40 MG tablet Take 40 mg by mouth at bedtime.   brimonidine-timolol (COMBIGAN) 0.2-0.5 % ophthalmic solution Place 1 drop into both eyes every 12 (twelve) hours.   carvedilol (COREG CR) 20 MG 24 hr capsule Take 20 mg by mouth daily.   Cholecalciferol (VITAMIN D) 50 MCG (2000 UT) CAPS Take 2 capsules by mouth 2 (two) times daily.   diltiazem (CARDIZEM CD) 360 MG 24 hr capsule Take 360 mg by mouth daily.   dorzolamide (TRUSOPT) 2 % ophthalmic solution Place 1 drop into the left eye 2 (two) times daily.   empagliflozin (JARDIANCE) 25 MG TABS tablet Take 25 mg by mouth daily.   fluticasone (FLONASE) 50 MCG/ACT nasal spray Place 2 sprays into the nose daily.   glipiZIDE (GLUCOTROL) 10 MG tablet Take 10 mg by mouth daily before breakfast.  ketorolac (ACULAR) 0.5 % ophthalmic solution 1 drop 4 (four) times daily.   letrozole (FEMARA) 2.5 MG tablet Take 2.5 mg by mouth daily.   Liraglutide (VICTOZA [place]) Inject 1.8 mg into the skin daily.    loratadine (CLARITIN) 10 MG tablet Take 10 mg by mouth daily.   losartan (COZAAR) 100 MG tablet Take 100 mg by mouth daily.   metFORMIN (GLUCOPHAGE-XR) 500 MG 24 hr tablet Take 1,000 mg by mouth 2 (two) times daily.   pantoprazole (PROTONIX) 40 MG tablet Take 1 tablet (40 mg total) by mouth daily.   spironolactone (ALDACTONE) 25 MG tablet Take 1 tablet (25  mg total) by mouth daily.     Allergies:   Sulfa antibiotics   Social History   Socioeconomic History   Marital status: Widowed    Spouse name: Not on file   Number of children: 4   Years of education: Not on file   Highest education level: Not on file  Occupational History   Occupation: Retired  Tobacco Use   Smoking status: Never   Smokeless tobacco: Never  Vaping Use   Vaping Use: Never used  Substance and Sexual Activity   Alcohol use: No   Drug use: Not on file   Sexual activity: Not on file  Other Topics Concern   Not on file  Social History Narrative   Lives with husband one daughter and two grandchildren.   Social Determinants of Health   Financial Resource Strain: Not on file  Food Insecurity: Not on file  Transportation Needs: Not on file  Physical Activity: Not on file  Stress: Not on file  Social Connections: Not on file     Family History: The patient's family history includes Cancer in her mother; Leukemia in her sister. There is no history of Colon cancer.  ROS:     Please see the history of present illness.    All other systems reviewed and are negative.  EKGs/Labs/Other Studies Reviewed:    The following studies were reviewed today:   EKG:  EKG is not ordered today.     Echocardiogram on 11/06/2020:  1. Left ventricular ejection fraction, by estimation, is 60 to 65%. The  left ventricle has normal function. The left ventricle has no regional  wall motion abnormalities. There is severe left ventricular hypertrophy.  Left ventricular diastolic parameters   are indeterminate.   2. Right ventricular systolic function is normal. The right ventricular  size is normal.   3. Left atrial size was severely dilated.   4. Right atrial size was severely dilated.   5. The mitral valve is normal in structure. Trivial mitral valve  regurgitation. No evidence of mitral stenosis.   6. The tricuspid valve is abnormal. Tricuspid valve regurgitation is   moderate.   7. The aortic valve is tricuspid. There is mild calcification of the  aortic valve. There is mild thickening of the aortic valve. Aortic valve  regurgitation is not visualized. No aortic stenosis is present.   8. Mild pulmonary HTN, PASP is 32 mmHg.   9. The inferior vena cava is normal in size with greater than 50%  respiratory variability, suggesting right atrial pressure of 3 mmHg.   Comparison(s): Echocardiogram done 10/20/19 showed an EF of 65-70%.   Recent Labs: No results found for requested labs within last 365 days.  Recent Lipid Panel No results found for: "CHOL", "TRIG", "HDL", "CHOLHDL", "VLDL", "LDLCALC", "LDLDIRECT"   Risk Assessment/Calculations:    CHA2DS2-VASc Score = 6  This indicates a 9.7% annual risk of stroke. The patient's score is based upon: CHF History: 0 HTN History: 1 Diabetes History: 1 Stroke History: 0 Vascular Disease History: 1 Age Score: 2 Gender Score: 1   Physical Exam:    VS:  BP (!) 160/101 (BP Location: Left Arm, Patient Position: Sitting, Cuff Size: Normal)   Pulse 95   Ht 5' (1.524 m)   Wt 131 lb 12.8 oz (59.8 kg)   SpO2 100%   BMI 25.74 kg/m     Wt Readings from Last 3 Encounters:  09/15/22 131 lb 12.8 oz (59.8 kg)  08/05/22 130 lb (59 kg)  08/01/22 132 lb 9.6 oz (60.1 kg)     GEN: Well nourished, well developed in no acute distress HEENT: Normal NECK: No JVD; No carotid bruits CARDIAC: S1/S2, RRR, no murmurs, rubs, gallops; 2+ pulses except 1+ PT pulse along right foot RESPIRATORY:  Clear to auscultation without rales, wheezing or rhonchi  MUSCULOSKELETAL:  No edema; No deformity  SKIN: Warm and dry NEUROLOGIC:  Alert and oriented x 3 PSYCHIATRIC:  Normal affect   ASSESSMENT:    1. Resistant hypertension   2. Medication management   3. Atrial fibrillation, unspecified type ([place])   4. Pulmonary hypertension ([place])   5. OSA (obstructive sleep apnea)   6. PAD (peripheral artery disease) ([place])   7.  Diabetic ulcer of right foot associated with type 2 diabetes mellitus, unspecified part of foot, unspecified ulcer stage ([place])    PLAN:    In order of problems listed above:  Resistant HTN, medication management Poorly controlled BP today. BP on arrival, 188/100. Repeat BP by me 160/101. Does admit to stress. Compliant with her medications. Will increase Coreg CR to 40 mg daily. Continue Aldactone, diltiazem, and losartan. Discussed to monitor BP at home at least 2 hours after medications and sitting for 5-10 minutes. Given BP log. Recommended to obtain OMRON cuff. Salty six diet sheet given. Heart healthy diet and regular cardiovascular exercise encouraged. Will refer to advanced HTN clinic. Will obtain BMET in 1-2 weeks at [place] (PCP).  2. A-fib HR today 95. Denies any tachycardia or palpitations. Continue current medication regimen. Currently on appropriate dosage of Eliquis. Denies any bleeding issues. Heart healthy diet and regular cardiovascular exercise encouraged.   3. Pulmonary HTN, OSA Most recent echo revealed mild pulmonary HTN, PASP was 32 mmHg.  This is likely secondary to her untreated OSA and diastolic dysfunction.Continue current medication regimen.  Has upcoming sleep study scheduled for April 2024.   4.  PAD, foot ulcers ABI's performed of her lower extremities with [facility] that revealed severe bilateral lower extremity arterial occlusive disease. Seeing Dr. Levander Campion at [facility] in [place], [place], recent procedure - do not have records - will request. Says she has some right foot ulcers, continue routine dressing changes. Continue to follow with PCP.   5.  Disposition: Follow-up with me or APP in 4 to 6 weeks or sooner if anything changes.  Medication Adjustments/Labs and Tests Ordered: Current medicines are reviewed at length with the patient today.  Concerns regarding medicines are outlined above.  Orders Placed This Encounter  Procedures   Basic  metabolic panel   AMB REFERRAL TO ADVANCED HTN CLINIC   Meds ordered this encounter  Medications   carvedilol (COREG CR) 40 MG 24 hr capsule    Sig: Take 1 capsule (40 mg total) by mouth daily.    Dispense:  90 capsule    Refill:  1    09/15/22 Dose Increase    Patient Instructions  Medication Instructions:  INCREASE Coreg to 40 mg once a day Continue all other medications as directed  Labwork: BMET 1-2 weeks at [place]  Testing/Procedures: none  Follow-Up:  Your physician recommends that you schedule a follow-up appointment in: 4-6 weeks  Any Other Special Instructions Will Be Listed Below (If Applicable).  You have been referred to Advanced Hypertension Clinic  If you need a refill on your cardiac medications before your next appointment, please call your pharmacy.       SignedFinis Bud, NP  09/15/2022 11:13 AM    [place]

## 2022-09-15 NOTE — Telephone Encounter (Signed)
Left message for patient to call office. MyChart message sent.

## 2022-09-15 NOTE — Telephone Encounter (Signed)
-----   Message from Finis Bud, NP sent at 09/12/2022  4:45 PM EST ----- Normal study. Will discuss results at upcoming visit with me.  Finis Bud, AGNP-C

## 2022-09-24 DIAGNOSIS — R748 Abnormal levels of other serum enzymes: Secondary | ICD-10-CM | POA: Diagnosis not present

## 2022-09-24 DIAGNOSIS — N189 Chronic kidney disease, unspecified: Secondary | ICD-10-CM | POA: Diagnosis not present

## 2022-09-24 DIAGNOSIS — E7801 Familial hypercholesterolemia: Secondary | ICD-10-CM | POA: Diagnosis not present

## 2022-09-24 DIAGNOSIS — E039 Hypothyroidism, unspecified: Secondary | ICD-10-CM | POA: Diagnosis not present

## 2022-09-24 DIAGNOSIS — E1165 Type 2 diabetes mellitus with hyperglycemia: Secondary | ICD-10-CM | POA: Diagnosis not present

## 2022-09-24 DIAGNOSIS — E876 Hypokalemia: Secondary | ICD-10-CM | POA: Diagnosis not present

## 2022-09-24 DIAGNOSIS — I10 Essential (primary) hypertension: Secondary | ICD-10-CM | POA: Diagnosis not present

## 2022-09-24 DIAGNOSIS — E7849 Other hyperlipidemia: Secondary | ICD-10-CM | POA: Diagnosis not present

## 2022-10-01 DIAGNOSIS — R748 Abnormal levels of other serum enzymes: Secondary | ICD-10-CM | POA: Diagnosis not present

## 2022-10-01 DIAGNOSIS — I10 Essential (primary) hypertension: Secondary | ICD-10-CM | POA: Diagnosis not present

## 2022-10-01 DIAGNOSIS — Z6824 Body mass index (BMI) 24.0-24.9, adult: Secondary | ICD-10-CM | POA: Diagnosis not present

## 2022-10-01 DIAGNOSIS — E7849 Other hyperlipidemia: Secondary | ICD-10-CM | POA: Diagnosis not present

## 2022-10-01 DIAGNOSIS — C50911 Malignant neoplasm of unspecified site of right female breast: Secondary | ICD-10-CM | POA: Diagnosis not present

## 2022-10-01 DIAGNOSIS — E876 Hypokalemia: Secondary | ICD-10-CM | POA: Diagnosis not present

## 2022-10-01 DIAGNOSIS — I739 Peripheral vascular disease, unspecified: Secondary | ICD-10-CM | POA: Diagnosis not present

## 2022-10-01 DIAGNOSIS — R944 Abnormal results of kidney function studies: Secondary | ICD-10-CM | POA: Diagnosis not present

## 2022-10-01 DIAGNOSIS — E1122 Type 2 diabetes mellitus with diabetic chronic kidney disease: Secondary | ICD-10-CM | POA: Diagnosis not present

## 2022-10-01 DIAGNOSIS — R911 Solitary pulmonary nodule: Secondary | ICD-10-CM | POA: Diagnosis not present

## 2022-10-01 DIAGNOSIS — R2689 Other abnormalities of gait and mobility: Secondary | ICD-10-CM | POA: Diagnosis not present

## 2022-10-07 DIAGNOSIS — L97511 Non-pressure chronic ulcer of other part of right foot limited to breakdown of skin: Secondary | ICD-10-CM | POA: Diagnosis not present

## 2022-10-07 DIAGNOSIS — I739 Peripheral vascular disease, unspecified: Secondary | ICD-10-CM | POA: Diagnosis not present

## 2022-10-08 DIAGNOSIS — I708 Atherosclerosis of other arteries: Secondary | ICD-10-CM | POA: Diagnosis not present

## 2022-10-08 DIAGNOSIS — E1122 Type 2 diabetes mellitus with diabetic chronic kidney disease: Secondary | ICD-10-CM | POA: Diagnosis not present

## 2022-10-08 DIAGNOSIS — Z0001 Encounter for general adult medical examination with abnormal findings: Secondary | ICD-10-CM | POA: Diagnosis not present

## 2022-10-08 DIAGNOSIS — M810 Age-related osteoporosis without current pathological fracture: Secondary | ICD-10-CM | POA: Diagnosis not present

## 2022-10-08 DIAGNOSIS — E039 Hypothyroidism, unspecified: Secondary | ICD-10-CM | POA: Diagnosis not present

## 2022-10-08 DIAGNOSIS — D649 Anemia, unspecified: Secondary | ICD-10-CM | POA: Diagnosis not present

## 2022-10-08 DIAGNOSIS — N189 Chronic kidney disease, unspecified: Secondary | ICD-10-CM | POA: Diagnosis not present

## 2022-10-08 DIAGNOSIS — E7849 Other hyperlipidemia: Secondary | ICD-10-CM | POA: Diagnosis not present

## 2022-10-08 DIAGNOSIS — I10 Essential (primary) hypertension: Secondary | ICD-10-CM | POA: Diagnosis not present

## 2022-10-08 NOTE — Progress Notes (Unsigned)
Advanced Hypertension Clinic Initial Assessment:    Date:  10/09/2022   ID:  EMMILIA Goodman, DOB 04/08/1946, MRN [id number]  PCP:  Jalene Mullet, PA-C  Cardiologist:  Carlyle Dolly, MD  Nephrologist:  Referring MD: Finis Bud, NP   CC: Hypertension  History of Present Illness:    Betty Goodman is a 77 y.o. female with a hx of atrial fibrillation, resistant hypertension, coronary artery calcification on CT, pulmonary hypertension, DM2, breast cancer, OSA, PAD here to establish care in the Advanced Hypertension Clinic.   She saw Finis Bud, NP in our [place] office and given persistent difficulties controlling hypertension was referred to Advanced Hypertension Clinic. Her Coreg CR was increased to 40mg  daily. Aldactone, Diltiazem, Losartan continued. Renal duplex 09/04/22 with no renal artery stenosis.   Betty Goodman was diagnosed with hypertension in the late 1990s. It has been difficult to control. Blood pressure checked with arm cuff at home. Readings have been most often 129s-170s/100s. she reports tobacco use never. Alcohol use never. She does not exercise. Notes she is not as interested in social activities since her husband passed four years ago. Does have a friend she meets once per week for lunch. she eats at home most of the time and outside of the home only once per week and does follow low sodium diet.   Takes her morning medications between 6A-7A Diltiazem, Losartan. Takes Spironolactone, Coreg CR in the evening. She did not take them for two days in the evening and when she didn't take them did not feel as dry. Feels her Carvedilol and Spironolactone are drying her out as mouth feels dry in the morning. Attributes it to these medications as she takes them at night. Drinks one 16 oz cup of coffee and then water or Crystal light during the day. Gets most of her medications through the [place]. She attributes a lot of her elevated blood pressure to stress.   When discussing her  PAD she says no claudication symptoms and ulcers are slowly healing. Had "balloon procedure" 08/16/22 and is now wearing "nitro patches" on her legs at night. Unable to view notes from Dr. Levander Campion at [facility] in [place].  Previous antihypertensives: Chlorthalidone - does not recall taking Clonidine - tolerated fine Lisinopril-HCTZ - switched to ARB Amlodipine - switched to Diltiazem for rate control  Past Medical History:  Diagnosis Date   Atrial fibrillation (HCC)    Breast cancer (HCC)    Stage IB (pT1, pN107micro, cM0) right breast cancer   Hypertension    Kidney stone    resulting in stenting   Type 2 diabetes mellitus ([place])    Unspecified essential hypertension    Past Surgical History:  Procedure Laterality Date   ADENOIDECTOMY     COLONOSCOPY  08/17/2019   Dr. Rocco Serene; Mild nonbleeding diverticulosis in the left colon. Recommended consider repeat in 5-10 years given questionable maternal grandmother with colon cancer   TONSILLECTOMY     TUBAL LIGATION     Current Medications: Current Meds  Medication Sig   alendronate (FOSAMAX) 70 MG tablet Take 70 mg by mouth once a week. Take with a full glass of water on an empty stomach.   apixaban (ELIQUIS) 5 MG TABS tablet Take 1 tablet (5 mg total) by mouth 2 (two) times daily.   atorvastatin (LIPITOR) 40 MG tablet Take 40 mg by mouth at bedtime.   brimonidine-timolol (COMBIGAN) 0.2-0.5 % ophthalmic solution Place 1 drop into both eyes every 12 (  twelve) hours.   carvedilol (COREG CR) 40 MG 24 hr capsule Take 1 capsule (40 mg total) by mouth daily.   Cholecalciferol (VITAMIN D) 50 MCG (2000 UT) CAPS Take 2 capsules by mouth 2 (two) times daily.   diltiazem (CARDIZEM CD) 360 MG 24 hr capsule Take 360 mg by mouth daily.   dorzolamide (TRUSOPT) 2 % ophthalmic solution Place 1 drop into the left eye 2 (two) times daily.   doxycycline (VIBRAMYCIN) 100 MG capsule Take 100 mg by mouth 2 (two) times daily.   empagliflozin  (JARDIANCE) 25 MG TABS tablet Take 25 mg by mouth daily.   fluticasone (FLONASE) 50 MCG/ACT nasal spray Place 2 sprays into the nose daily.   glipiZIDE (GLUCOTROL) 10 MG tablet Take 10 mg by mouth daily before breakfast.   ketorolac (ACULAR) 0.5 % ophthalmic solution 1 drop 4 (four) times daily.   letrozole (FEMARA) 2.5 MG tablet Take 2.5 mg by mouth daily.   loratadine (CLARITIN) 10 MG tablet Take 10 mg by mouth daily.   metFORMIN (GLUCOPHAGE-XR) 500 MG 24 hr tablet Take 1,000 mg by mouth 2 (two) times daily.   nitroGLYCERIN (NITRODUR - DOSED IN MG/24 HR) 0.1 mg/hr patch Place 0.1 mg onto the skin daily. Apply to top of both feet   pantoprazole (PROTONIX) 40 MG tablet Take 1 tablet (40 mg total) by mouth daily.   SANTYL 250 UNIT/GM ointment Apply 1 Application topically daily.   valsartan (DIOVAN) 320 MG tablet Take 1 tablet (320 mg total) by mouth daily.   [DISCONTINUED] losartan (COZAAR) 100 MG tablet Take 100 mg by mouth daily.   [DISCONTINUED] Potassium Chloride ER 20 MEQ TBCR Take 1 tablet by mouth daily.   [DISCONTINUED] spironolactone (ALDACTONE) 50 MG tablet Take 1 tablet (50 mg total) by mouth daily.     Allergies:   Sulfa antibiotics   Social History   Socioeconomic History   Marital status: Widowed    Spouse name: Not on file   Number of children: 4   Years of education: Not on file   Highest education level: Not on file  Occupational History   Occupation: Retired  Tobacco Use   Smoking status: Never   Smokeless tobacco: Never  Vaping Use   Vaping Use: Never used  Substance and Sexual Activity   Alcohol use: No   Drug use: Not on file   Sexual activity: Not on file  Other Topics Concern   Not on file  Social History Narrative   Lives with husband one daughter and two grandchildren.   Social Determinants of Health   Financial Resource Strain: Low Risk  (10/09/2022)   Overall Financial Resource Strain (CARDIA)    Difficulty of Paying Living Expenses: Not very  hard  Food Insecurity: No Food Insecurity (10/09/2022)   Hunger Vital Sign    Worried About Running Out of Food in the Last Year: Never true    Ran Out of Food in the Last Year: Never true  Transportation Needs: No Transportation Needs (10/09/2022)   PRAPARE - Hydrologist (Medical): No    Lack of Transportation (Non-Medical): No  Physical Activity: Inactive (10/09/2022)   Exercise Vital Sign    Days of Exercise per Week: 0 days    Minutes of Exercise per Session: 0 min  Stress: Not on file  Social Connections: Not on file     Family History: The patient's family history includes Cancer in her mother; Leukemia in her sister. There is  no history of Colon cancer.  ROS:   Please see the history of present illness.     All other systems reviewed and are negative.  EKGs/Labs/Other Studies Reviewed:    EKG:  EKG is ordered today.  EKG performed today demonstrates atrial fibrillation 81 with occasional PVC and no acute ST/T wave changes.   Cardiac Studies & Procedures       ECHOCARDIOGRAM  ECHOCARDIOGRAM COMPLETE 11/06/2020  Narrative ECHOCARDIOGRAM REPORT    Patient Name:   SAMIR LEYMAN Date of Exam: 11/06/2020 Medical Rec #:  [id number]      Height:       60.0 in Accession #:    [id number]     Weight:       151.4 lb Date of Birth:  [birth year]     BSA:          1.658 m Patient Age:    10 years       BP:           128/82 mmHg Patient Gender: F              HR:           95 bpm. Exam Location:  [place]  Procedure: 2D Echo, Cardiac Doppler and Color Doppler  Indications:    I27.20 Pulmonary hypertension  History:        Patient has prior history of Echocardiogram examinations, most recent 10/20/2019. Abnormal ECG, Pulmonary HTN and CKD, Arrythmias:Atrial Fibrillation, Signs/Symptoms:Shortness of Breath; Risk Factors:Hypertension, Diabetes, Dyslipidemia, Non-Smoker and Sleep Apnea.  Sonographer:    Jeneen Montgomery RDMS, RVT, RDCS Referring Phys:  [place].  IMPRESSIONS   1. Left ventricular ejection fraction, by estimation, is 60 to 65%. The left ventricle has normal function. The left ventricle has no regional wall motion abnormalities. There is severe left ventricular hypertrophy. Left ventricular diastolic parameters are indeterminate. 2. Right ventricular systolic function is normal. The right ventricular size is normal. 3. Left atrial size was severely dilated. 4. Right atrial size was severely dilated. 5. The mitral valve is normal in structure. Trivial mitral valve regurgitation. No evidence of mitral stenosis. 6. The tricuspid valve is abnormal. Tricuspid valve regurgitation is moderate. 7. The aortic valve is tricuspid. There is mild calcification of the aortic valve. There is mild thickening of the aortic valve. Aortic valve regurgitation is not visualized. No aortic stenosis is present. 8. Mild pulmonary HTN, PASP is 32 mmHg. 9. The inferior vena cava is normal in size with greater than 50% respiratory variability, suggesting right atrial pressure of 3 mmHg.  Comparison(s): Echocardiogram done 10/20/19 showed an EF of 65-70%.  FINDINGS Left Ventricle: Left ventricular ejection fraction, by estimation, is 60 to 65%. The left ventricle has normal function. The left ventricle has no regional wall motion abnormalities. Global longitudinal strain performed but not reported based on interpreter judgement due to suboptimal tracking. The left ventricular internal cavity size was normal in size. There is severe left ventricular hypertrophy. Left ventricular diastolic parameters are indeterminate.  Right Ventricle: The right ventricular size is normal. No increase in right ventricular wall thickness. Right ventricular systolic function is normal.  Left Atrium: Left atrial size was severely dilated.  Right Atrium: Right atrial size was severely dilated.  Pericardium: There is no evidence of pericardial  effusion.  Mitral Valve: The mitral valve is normal in structure. Trivial mitral valve regurgitation. No evidence of mitral valve stenosis.  Tricuspid Valve: The tricuspid valve is abnormal. Tricuspid valve regurgitation  is moderate . No evidence of tricuspid stenosis.  Aortic Valve: The aortic valve is tricuspid. There is mild calcification of the aortic valve. There is mild thickening of the aortic valve. There is mild aortic valve annular calcification. Aortic valve regurgitation is not visualized. No aortic stenosis is present. Aortic valve mean gradient measures 3.1 mmHg. Aortic valve peak gradient measures 6.0 mmHg. Aortic valve area, by VTI measures 1.49 cm.  Pulmonic Valve: The pulmonic valve was not well visualized. Pulmonic valve regurgitation is mild. No evidence of pulmonic stenosis.  Aorta: The aortic root is normal in size and structure.  Pulmonary Artery: Mild pulmonary HTN, PASP is 32 mmHg.  Venous: The inferior vena cava is normal in size with greater than 50% respiratory variability, suggesting right atrial pressure of 3 mmHg.  IAS/Shunts: No atrial level shunt detected by color flow Doppler.   LEFT VENTRICLE PLAX 2D LVIDd:         2.93 cm     Diastology LVIDs:         2.09 cm     LV e' medial:    4.72 cm/s LV PW:         1.50 cm     LV E/e' medial:  18.0 LV IVS:        1.61 cm     LV e' lateral:   8.20 cm/s LVOT diam:     1.70 cm     LV E/e' lateral: 10.4 LV SV:         27 LV SV Index:   16 LVOT Area:     2.27 cm  LV Volumes (MOD) LV vol d, MOD A2C: 50.8 ml LV vol d, MOD A4C: 46.9 ml LV vol s, MOD A2C: 26.2 ml LV vol s, MOD A4C: 23.4 ml LV SV MOD A2C:     24.6 ml LV SV MOD A4C:     46.9 ml LV SV MOD BP:      25.0 ml  RIGHT VENTRICLE RV S prime:     11.25 cm/s TAPSE (M-mode): 1.6 cm  LEFT ATRIUM             Index       RIGHT ATRIUM           Index LA diam:        3.90 cm 2.35 cm/m  RA Area:     23.40 cm LA Vol (A2C):   64.3 ml 38.77 ml/m RA Volume:    70.60 ml  42.57 ml/m LA Vol (A4C):   74.7 ml 45.05 ml/m LA Biplane Vol: 74.6 ml 44.99 ml/m AORTIC VALVE AV Area (Vmax):    1.43 cm AV Area (Vmean):   1.35 cm AV Area (VTI):     1.49 cm AV Vmax:           122.17 cm/s AV Vmean:          82.179 cm/s AV VTI:            0.178 m AV Peak Grad:      6.0 mmHg AV Mean Grad:      3.1 mmHg LVOT Vmax:         76.83 cm/s LVOT Vmean:        48.867 cm/s LVOT VTI:          0.117 m LVOT/AV VTI ratio: 0.66  AORTA Ao Root diam: 2.90 cm  MITRAL VALVE               TRICUSPID  VALVE MV Area (PHT):             TR Peak grad:   28.5 mmHg MV Decel Time: 174 msec    TR Vmax:        267.00 cm/s MR Peak grad: 12.1 mmHg MR Vmax:      174.00 cm/s  SHUNTS MV E velocity: 85.13 cm/s  Systemic VTI:  0.12 m Systemic Diam: 1.70 cm  Carlyle Dolly MD Electronically signed by Carlyle Dolly MD Signature Date/Time: 11/06/2020/2:58:54 PM    Final              Recent Labs: No results found for requested labs within last 365 days.   Recent Lipid Panel No results found for: "CHOL", "TRIG", "HDL", "CHOLHDL", "VLDL", "LDLCALC", "LDLDIRECT"  Physical Exam:   VS:  BP (!) 182/100   Pulse 81   Ht 5' (1.524 m)   Wt 128 lb (58.1 kg)   BMI 25.00 kg/m  , BMI Body mass index is 25 kg/m. GENERAL:  Well appearing HEENT: Pupils equal round and reactive, fundi not visualized, oral mucosa unremarkable NECK:  No jugular venous distention, waveform within normal limits, carotid upstroke brisk and symmetric, no bruits, no thyromegaly LYMPHATICS:  No cervical adenopathy LUNGS:  Clear to auscultation bilaterally HEART:  IRIR.  PMI not displaced or sustained,S1 and S2 within normal limits, no S3, no S4, no clicks, no rubs, no murmurs ABD:  Flat, positive bowel sounds normal in frequency in pitch, no bruits, no rebound, no guarding, no midline pulsatile mass, no hepatomegaly, no splenomegaly EXT:  R PT 2+, L PT 1+, no edema, no cyanosis no clubbing SKIN:  No rashes  no nodules NEURO:  Cranial nerves II through XII grossly intact, motor grossly intact throughout PSYCH:  Cognitively intact, oriented to person place and time   ASSESSMENT/PLAN:    HTN - BP not at goal <130/80. STOP Losartan. START Valsartan 320mg  QD QHS. REDUCE Spironolactone to 25mg  QD and move to morning dosing due to dry mouth. Continue Coreg CR 40mg  QHS, Diltiazem 360mg  QAM.  CT abdomen 03/25/22 adrenal glands unremarkable. No evidence of pheochromocytoma. Sleep study upcoming, as below Plan for carotid duplex as unable to do BP bilaterally due to history of breast cancer to assess for carotid or subclavian stenosis.  Labs today BMP, thyroid panel If BP not controlled at follow up, consider addition of Doxazosin 2mg  QHS. Will require BMP at follow up visit in 2 weeks.  Pulmonary hypertension / OSA - Has upcoming visit with Dr. Halford Chessman to discuss sleep study and sleep apnea. Prior sleep study moderate OSA but declined CPAP. We re-discussed CPAP and she thinks nasal pillow mask may be comfortable. Discussed the role untreated OSA would play in hypertension.   Coronary calcification on CT / HLD -   PAD - Follows with vascular provider in [place]. Reports no claudication symptoms. Reports "they did the balloons and there's nothing else they can do". Notes from [facility] in [place] unavailable.  Atrial fibrillation / Hypercoagulable state - Rate controlled today by EKG. Continue Diltiazem 360mg  BID, Eliquis 5mg  BID. Does not meet dose reduction criteria. Denies bleeding complications on Eliquis.   DM2 - Continue to follow with PCP. Appreciate inclusion of SLT2i for cardioprotective benefit.   Screening for Secondary Hypertension:     10/09/2022    9:44 AM  Causes  Drugs/Herbals Screened     - Comments No OTC agents, one 16 oz cup of coffee  Renovascular HTN Screened     -  Comments 2024 renal duplex no stenosis  Sleep Apnea Screened     - Comments 10/2022 OV with Dr. Halford Chessman to establish   Thyroid Disease Screened     - Comments 10/09/22 thyroid panel  Hyperaldosteronism Not Screened     - Comments Unable to screen due to being on ARB - already on Spiro  Pheochromocytoma N/A     - Comments CT 03/2022 normal adrenals  Cushing's Syndrome Screened     - Comments Non cushingoid appearance  Coarctation of the Aorta Screened     - Comments 09/2022 carotid duplex ordered  Compliance Screened     - Comments takes medications regularly, gets through the [place]    Relevant Labs/Studies:    Latest Ref Rng & Units 03/01/2020   11:52 AM 09/19/2013    2:27 PM  Basic Labs  Sodium 135 - 145 mmol/L 142  143   Potassium 3.5 - 5.1 mmol/L 3.6  3.9   Creatinine 0.44 - 1.00 mg/dL 1.07  0.84                    09/04/2022    9:27 AM  Renovascular   Renal Artery [place] Completed Yes     Disposition:    FU with MD/PharmD in 6 weeks    Medication Adjustments/Labs and Tests Ordered: Current medicines are reviewed at length with the patient today.  Concerns regarding medicines are outlined above.  Orders Placed This Encounter  Procedures   Thyroid Panel With TSH   Basic metabolic panel   EKG [id number]   VAS US CAROTID   Meds ordered this encounter  Medications   spironolactone (ALDACTONE) 25 MG tablet    Sig: Take 1 tablet (25 mg total) by mouth daily.    Dispense:  90 tablet    Refill:  3    Dose increased 08/05/2022   valsartan (DIOVAN) 320 MG tablet    Sig: Take 1 tablet (320 mg total) by mouth daily.    Dispense:  30 tablet    Refill:  6     Signed, Loel Dubonnet, NP  10/09/2022 10:29 AM    [facility]

## 2022-10-09 ENCOUNTER — Ambulatory Visit (INDEPENDENT_AMBULATORY_CARE_PROVIDER_SITE_OTHER): Payer: Medicare Other | Admitting: Family

## 2022-10-09 ENCOUNTER — Encounter (HOSPITAL_BASED_OUTPATIENT_CLINIC_OR_DEPARTMENT_OTHER): Payer: Self-pay | Admitting: Family

## 2022-10-09 VITALS — BP 182/100 | HR 81 | Ht 60.0 in | Wt 128.0 lb

## 2022-10-09 DIAGNOSIS — I739 Peripheral vascular disease, unspecified: Secondary | ICD-10-CM

## 2022-10-09 DIAGNOSIS — G4733 Obstructive sleep apnea (adult) (pediatric): Secondary | ICD-10-CM | POA: Diagnosis not present

## 2022-10-09 DIAGNOSIS — I272 Pulmonary hypertension, unspecified: Secondary | ICD-10-CM | POA: Diagnosis not present

## 2022-10-09 DIAGNOSIS — Z79899 Other long term (current) drug therapy: Secondary | ICD-10-CM

## 2022-10-09 DIAGNOSIS — I4821 Permanent atrial fibrillation: Secondary | ICD-10-CM | POA: Diagnosis not present

## 2022-10-09 DIAGNOSIS — I1A0 Resistant hypertension: Secondary | ICD-10-CM

## 2022-10-09 MED ORDER — SPIRONOLACTONE 25 MG PO TABS: 25.0000 mg | ORAL_TABLET | Freq: Every day | ORAL | 3 refills | Status: DC

## 2022-10-09 MED ORDER — VALSARTAN 320 MG PO TABS: 320.0000 mg | ORAL_TABLET | Freq: Every day | ORAL | 6 refills | Status: DC

## 2022-10-09 NOTE — Patient Instructions (Signed)
Medication Instructions:  Your physician has recommended you make the following change in your medication:   Stop: Potassium   Stop: Losartan   Decrease: Spironolactone 25mg  daily   Start: Valsartan 320mg  daily starting Friday evening    Labwork: Your physician recommends that you return for lab work today- BMP and Thyroid Panel     Testing/Procedures: Your physician has requested that you have a carotid duplex. This test is an ultrasound of the carotid arteries in your neck. It looks at blood flow through these arteries that supply the brain with blood. Allow one hour for this exam. There are no restrictions or special instructions.    Follow-Up: Follow up as scheduled with Finis Bud, NP   &   6-8 weeks in ADV HTN Clinic with Dr. Ardine Eng, Laurann [place], NP or PharmD

## 2022-10-10 ENCOUNTER — Telehealth: Payer: Self-pay | Admitting: Nurse Practitioner

## 2022-10-10 LAB — BASIC METABOLIC PANEL
BUN/Creatinine Ratio: 20 (ref 12–28)
BUN: 25 mg/dL (ref 8–27)
CO2: 25 mmol/L (ref 20–29)
Calcium: 10.4 mg/dL — ABNORMAL HIGH (ref 8.7–10.3)
Chloride: 100 mmol/L (ref 96–106)
Creatinine, Ser: 1.25 mg/dL — ABNORMAL HIGH (ref 0.57–1.00)
Glucose: 275 mg/dL — ABNORMAL HIGH (ref 70–99)
Potassium: 4 mmol/L (ref 3.5–5.2)
Sodium: 140 mmol/L (ref 134–144)
eGFR: 45 mL/min/{1.73_m2} — ABNORMAL LOW (ref >59)

## 2022-10-10 LAB — THYROID PANEL WITH TSH
Free Thyroxine Index: 2.9 (ref 1.2–4.9)
T3 Uptake Ratio: 33 % (ref 24–39)
T4, Total: 8.8 ug/dL (ref 4.5–12.0)
TSH: 0.845 u[IU]/mL (ref 0.450–4.500)

## 2022-10-10 NOTE — Telephone Encounter (Signed)
Patient is requesting to speak with [place]

## 2022-10-10 NOTE — Telephone Encounter (Signed)
Pt states she is returning your call °

## 2022-10-10 NOTE — Telephone Encounter (Signed)
Explained to patient that I could not see where anyone had called & left her a message.  Advised that it may have been a reminder call for her Pulmonary appointment on Monday, 10/13/22 with Dr. Halford Chessman.  Suggested that she try using the messaging system in mychart for easier communication & to see her upcoming appointments.  She verbalized understanding.

## 2022-10-13 ENCOUNTER — Ambulatory Visit (INDEPENDENT_AMBULATORY_CARE_PROVIDER_SITE_OTHER): Payer: Medicare Other | Admitting: Pulmonary Disease

## 2022-10-13 ENCOUNTER — Encounter: Payer: Self-pay | Admitting: Pulmonary Disease

## 2022-10-13 ENCOUNTER — Other Ambulatory Visit: Payer: Self-pay | Admitting: Gastroenterology

## 2022-10-13 VITALS — BP 154/98 | HR 90 | Ht 60.0 in | Wt 129.0 lb

## 2022-10-13 DIAGNOSIS — K219 Gastro-esophageal reflux disease without esophagitis: Secondary | ICD-10-CM

## 2022-10-13 DIAGNOSIS — J342 Deviated nasal septum: Secondary | ICD-10-CM

## 2022-10-13 DIAGNOSIS — J310 Chronic rhinitis: Secondary | ICD-10-CM

## 2022-10-13 DIAGNOSIS — R0683 Snoring: Secondary | ICD-10-CM | POA: Diagnosis not present

## 2022-10-13 MED ORDER — AZELASTINE HCL 0.1 % NA SOLN: 1.0000 | Freq: Every morning | NASAL | 12 refills | Status: AC

## 2022-10-13 NOTE — Progress Notes (Signed)
[name]  Chief Complaint  Patient presents with   Consult    Sleep consult      Past Surgical History:  She  has a past surgical history that includes Tubal ligation; Tonsillectomy; Adenoidectomy; and Colonoscopy (08/17/2019).  Past Medical History:  Persistent A fib, Nephrolithiasis, DM 2, HTN, Rt breast cancer 2020   Constitutional:  BP (!) 154/98   Pulse 90   Ht 5' (1.524 m)   Wt 129 lb (58.5 kg)   SpO2 100% Comment: ra  BMI 25.19 kg/m   Brief Summary:  Betty Goodman is a 77 y.o. female with obstructive sleep apnea and pulmonary hypertension.      Subjective:   I last saw her in November 2021.  She had trouble using different types of CPAP masks due to nasal septal deviation and rhinitis.  She is followed by cardiology for A fib and hypertension.  There was concern her sleep apnea could be affecting management of these and she was advised to have further sleep assessment.  She snores while asleep and wakes up hearing herself snore and stop breathing.  Her mouth gets very dry at night.  She has trouble staying awake when watching TV.  She wakes up 4 to 5 times at night to use the bathroom.  She isn't using anything to help sleep or stay awake.    She has been using flonase at night and claritin.  She continues to have sinus congestion and runny nose.  Epworth score is 3 out of 24.  Physical Exam:   Appearance - well kempt   ENMT - no sinus tenderness, no oral exudate, no LAN, Mallampati 3 airway, no stridor, poor dentition, deviated nasal septum  Respiratory - equal breath sounds bilaterally, no wheezing or rales  CV - irregular  Ext - no clubbing, no edema  Skin - no rashes  Psych - normal mood and affect   Pulmonary testing:  PFT 04/12/20 >> FEV1 2.01 (132%), FEV1% 83, TLC 5.07 (110%), DLCO 95%   Chest Imaging:  CT chest 03/25/22 >> 0.4 cm nodule RUL, coronary atherosclerosis, subpleural radiation  fibrosis in RUL and RML  Sleep Tests:  PSG 02/04/20 >> AHI 16.1, SpO2 low 82%   Cardiac Tests:  Echo 11/06/20 >> EF 60 to 65%, severe LVH, severe LA and RA dilation, mod TR  Social History:  She  reports that she has never smoked. She has never used smokeless tobacco. She reports that she does not drink alcohol.  Family History:  Her family history includes Cancer in her mother; Leukemia in her sister.     Assessment/Plan:   Obstructive sleep apnea. - she previously had trouble with CPAP therapy due to mask fit - she continues to have snoring, sleep disruption, apnea, and daytime sleepiness - she has history of hypertension, atrial fibrillation and pulmonary hypertension - I am concerned she still has significant obstructive sleep apnea - discussed how untreated sleep apnea can impact her health - she was advised that she would be a good candidate for an Inspire device because of her cardiovascular health, and poor dentition precludes use of an oral appliance - she is willing to try CPAP again, assuming she still has sleep apnea - will arrange for a home sleep study  Chronic rhinitis with deviated nasal septum. - will have her use azelastine in the morning and flonase at night - nasal irrigation once or twice per day - might need ENT assessment  if she has trouble tolerating CPAP again  Dry mouth. - explained how some of this could be related to her sinus issues and untreated sleep apnea, and might not be solely related to her medications  Pulmonary hypertension. - related to diastolic CHF and sleep disordered breathing  Atrial fibrillation. - followed by Dr. Kerry Hough with cardiology  Rt breast cancer s/p radiation therapy. - followed by Dr. Yvone Neu with onc[facility]  Time Spent Involved in Patient Care on Day of Examination:  38 minutes  Follow up:   Patient Instructions  Astepro (azelastine) nasal spray 1 spray in each nostril daily in the  morning.  Flonase 1 spray in each nostril daily in the evening.  Use saline nasal rinse (nasal irrigation) nightly before using flonase.  Will arrange for a home sleep study  Will call to arrange for follow up after sleep study reviewed   Medication List:   Allergies as of 10/13/2022       Reactions   Sulfa Antibiotics         Medication List        Accurate as of October 13, 2022  9:19 AM. If you have any questions, ask your nurse or doctor.          alendronate 70 MG tablet Commonly known as: FOSAMAX Take 70 mg by mouth once a week. Take with a full glass of water on an empty stomach.   apixaban 5 MG Tabs tablet Commonly known as: ELIQUIS Take 1 tablet (5 mg total) by mouth 2 (two) times daily.   atorvastatin 40 MG tablet Commonly known as: LIPITOR Take 40 mg by mouth at bedtime.   azelastine 0.1 % nasal spray Commonly known as: ASTELIN Place 1 spray into both nostrils every morning. Use in each nostril as directed Started by: Chesley Mires, MD   carvedilol 40 MG 24 hr capsule Commonly known as: COREG CR Take 1 capsule (40 mg total) by mouth daily.   Combigan 0.2-0.5 % ophthalmic solution Generic drug: brimonidine-timolol Place 1 drop into both eyes every 12 (twelve) hours.   diltiazem 360 MG 24 hr capsule Commonly known as: CARDIZEM CD Take 360 mg by mouth daily.   dorzolamide 2 % ophthalmic solution Commonly known as: TRUSOPT Place 1 drop into the left eye 2 (two) times daily.   doxycycline 100 MG capsule Commonly known as: VIBRAMYCIN Take 100 mg by mouth 2 (two) times daily.   fluticasone 50 MCG/ACT nasal spray Commonly known as: FLONASE Place 1 spray into both nostrils at bedtime.   glipiZIDE 10 MG tablet Commonly known as: GLUCOTROL Take 10 mg by mouth daily before breakfast.   Jardiance 25 MG Tabs tablet Generic drug: empagliflozin Take 25 mg by mouth daily.   ketorolac 0.5 % ophthalmic solution Commonly known as: ACULAR 1 drop 4  (four) times daily.   letrozole 2.5 MG tablet Commonly known as: FEMARA Take 2.5 mg by mouth daily.   loratadine 10 MG tablet Commonly known as: CLARITIN Take 10 mg by mouth daily.   metFORMIN 500 MG 24 hr tablet Commonly known as: GLUCOPHAGE-XR Take 1,000 mg by mouth 2 (two) times daily.   nitroGLYCERIN 0.1 mg/hr patch Commonly known as: NITRODUR - Dosed in mg/24 hr Place 0.1 mg onto the skin daily. Apply to top of both feet   pantoprazole 40 MG tablet Commonly known as: PROTONIX Take 1 tablet (40 mg total) by mouth daily.   Santyl 250 UNIT/GM ointment Generic drug: collagenase Apply  1 Application topically daily.   spironolactone 25 MG tablet Commonly known as: ALDACTONE Take 1 tablet (25 mg total) by mouth daily.   valsartan 320 MG tablet Commonly known as: Diovan Take 1 tablet (320 mg total) by mouth daily.   VICTOZA [place] Inject 1.8 mg into the skin daily.   Vitamin D 50 MCG (2000 UT) Caps Take 2 capsules by mouth 2 (two) times daily.        Signature:  Chesley Mires, MD [place] Pager - [phone] 10/13/2022, 9:19 AM

## 2022-10-13 NOTE — Patient Instructions (Signed)
Astepro (azelastine) nasal spray 1 spray in each nostril daily in the morning.  Flonase 1 spray in each nostril daily in the evening.  Use saline nasal rinse (nasal irrigation) nightly before using flonase.  Will arrange for a home sleep study  Will call to arrange for follow up after sleep study reviewed

## 2022-10-14 ENCOUNTER — Other Ambulatory Visit: Payer: Self-pay

## 2022-10-15 ENCOUNTER — Telehealth: Payer: Self-pay | Admitting: Pulmonary Disease

## 2022-10-15 ENCOUNTER — Other Ambulatory Visit (HOSPITAL_BASED_OUTPATIENT_CLINIC_OR_DEPARTMENT_OTHER): Payer: Self-pay | Admitting: Family

## 2022-10-15 DIAGNOSIS — Z79899 Other long term (current) drug therapy: Secondary | ICD-10-CM

## 2022-10-15 DIAGNOSIS — I1A0 Resistant hypertension: Secondary | ICD-10-CM

## 2022-10-15 DIAGNOSIS — I4821 Permanent atrial fibrillation: Secondary | ICD-10-CM

## 2022-10-15 DIAGNOSIS — I6529 Occlusion and stenosis of unspecified carotid artery: Secondary | ICD-10-CM

## 2022-10-15 DIAGNOSIS — G4733 Obstructive sleep apnea (adult) (pediatric): Secondary | ICD-10-CM

## 2022-10-15 DIAGNOSIS — I739 Peripheral vascular disease, unspecified: Secondary | ICD-10-CM

## 2022-10-15 DIAGNOSIS — I272 Pulmonary hypertension, unspecified: Secondary | ICD-10-CM

## 2022-10-15 NOTE — Telephone Encounter (Signed)
Pt called stating that she was returning a call from [facility] about picking up machine to do sleep study with. Please advise.

## 2022-10-20 ENCOUNTER — Ambulatory Visit: Payer: Medicare Other | Admitting: Nurse Practitioner

## 2022-10-20 DIAGNOSIS — I739 Peripheral vascular disease, unspecified: Secondary | ICD-10-CM | POA: Diagnosis not present

## 2022-10-20 DIAGNOSIS — L97511 Non-pressure chronic ulcer of other part of right foot limited to breakdown of skin: Secondary | ICD-10-CM | POA: Diagnosis not present

## 2022-10-20 DIAGNOSIS — E1151 Type 2 diabetes mellitus with diabetic peripheral angiopathy without gangrene: Secondary | ICD-10-CM | POA: Diagnosis not present

## 2022-10-20 NOTE — Telephone Encounter (Signed)
See that pt has been scheduled for sleep appt 4/10.

## 2022-10-21 ENCOUNTER — Ambulatory Visit: Payer: Medicare Other | Attending: Nurse Practitioner | Admitting: Nurse Practitioner

## 2022-10-21 ENCOUNTER — Encounter: Payer: Self-pay | Admitting: Nurse Practitioner

## 2022-10-21 VITALS — BP 162/83 | HR 89 | Wt 132.4 lb

## 2022-10-21 DIAGNOSIS — E11621 Type 2 diabetes mellitus with foot ulcer: Secondary | ICD-10-CM | POA: Diagnosis not present

## 2022-10-21 DIAGNOSIS — I1A0 Resistant hypertension: Secondary | ICD-10-CM | POA: Diagnosis not present

## 2022-10-21 DIAGNOSIS — I272 Pulmonary hypertension, unspecified: Secondary | ICD-10-CM | POA: Diagnosis not present

## 2022-10-21 DIAGNOSIS — I739 Peripheral vascular disease, unspecified: Secondary | ICD-10-CM | POA: Diagnosis not present

## 2022-10-21 DIAGNOSIS — L97519 Non-pressure chronic ulcer of other part of right foot with unspecified severity: Secondary | ICD-10-CM | POA: Insufficient documentation

## 2022-10-21 DIAGNOSIS — Z79899 Other long term (current) drug therapy: Secondary | ICD-10-CM | POA: Insufficient documentation

## 2022-10-21 DIAGNOSIS — G4733 Obstructive sleep apnea (adult) (pediatric): Secondary | ICD-10-CM | POA: Insufficient documentation

## 2022-10-21 DIAGNOSIS — I4891 Unspecified atrial fibrillation: Secondary | ICD-10-CM | POA: Diagnosis not present

## 2022-10-21 NOTE — Patient Instructions (Addendum)
Medication Instructions:  Your physician recommends that you continue on your current medications as directed. Please refer to the Current Medication list given to you today.   Labwork: BMET in 1 week (4/16) [facility]  Testing/Procedures: none  Follow-Up:  Your physician recommends that you schedule a follow-up appointment in: keep upcoming appointment in May  Any Other Special Instructions Will Be Listed Below (If Applicable).  If you need a refill on your cardiac medications before your next appointment, please call your pharmacy.

## 2022-10-21 NOTE — Progress Notes (Signed)
Cardiology Office Note:    Date:  10/21/2022   ID:  Betty ArMovita N Treiber, DOB 13-Jul-1946, MRN [id number]  PCP:  Joeseph AmorBoyd, Amy H, PA-C   [facility] Providers Cardiologist:  Dina RichBranch, Jonathan, MD     Referring MD: Encarnacion SlatesBoyd, Amy H, PA-C   CC: follow-up  History of Present Illness:    Betty Goodman is a very pleasant 77 y.o. female with a hx of the following:  A-fib Resistant hypertension Pulmonary hypertension Type 2 diabetes Breast cancer OSA PAD, foot ulcers  In 2021, TTE reported PASP in 80s.  The following year TTE revealed EF 60 to 65%, normal RV, severe BAE, moderate TR, PASP is 32.  Severe BAE would suggest significant diastolic dysfunction.  PFTs unremarkable, negative serologies. Previous hx of noncompliance with CPAP in the past. [facility] September 2023 for chest pain.  Workup was overall negative. Past ABI's revealed severe bilateral lower extremity arterial occlusive disease. No RAS seen on renal duplex.  Last seen by Gillian Shieldsaitlin Walker, NP on 10/09/2022 at [facility]. Losartan changed to Valsartan and Aldactone dose reduced. Labs arranged. Set up for carotid duplex.   Today she presents for follow-up. Doing well from a cardiac perspective. Seeing Dr. Trevor MaceSreeji Nair at [facility] in [place], s/p procedure on 08/16/2022; however I do not have these records. BP elevated on arrival, has taken all of her BP meds except for valsartan today. Still admits to stress. Denies any chest pain, shortness of breath, palpitations, syncope, presyncope, dizziness, orthopnea, PND, swelling or significant weight changes, or acute bleeding. She will have home sleep study performed soon.  Past Medical History:  Diagnosis Date   Atrial fibrillation    Breast cancer    Stage IB (pT1, pN671micro, cM0) right breast cancer   Hypertension    Kidney stone    resulting in stenting   Type 2 diabetes mellitus    Unspecified essential hypertension     Past Surgical  History:  Procedure Laterality Date   ADENOIDECTOMY     COLONOSCOPY  08/17/2019   Dr. Aurea GraffStraughan; Mild nonbleeding diverticulosis in the left colon. Recommended consider repeat in 5-10 years given questionable maternal grandmother with colon cancer   TONSILLECTOMY     TUBAL LIGATION     Current Meds  Medication Sig   alendronate (FOSAMAX) 70 MG tablet Take 70 mg by mouth once a week. Take with a full glass of water on an empty stomach.   apixaban (ELIQUIS) 5 MG TABS tablet Take 1 tablet (5 mg total) by mouth 2 (two) times daily.   atorvastatin (LIPITOR) 40 MG tablet Take 40 mg by mouth at bedtime.   azelastine (ASTELIN) 0.1 % nasal spray Place 1 spray into both nostrils every morning. Use in each nostril as directed   brimonidine-timolol (COMBIGAN) 0.2-0.5 % ophthalmic solution Place 1 drop into both eyes every 12 (twelve) hours.   carvedilol (COREG CR) 40 MG 24 hr capsule Take 1 capsule (40 mg total) by mouth daily.   Cholecalciferol (VITAMIN D) 50 MCG (2000 UT) CAPS Take 2 capsules by mouth 2 (two) times daily.   diltiazem (CARDIZEM CD) 360 MG 24 hr capsule Take 360 mg by mouth daily.   dorzolamide (TRUSOPT) 2 % ophthalmic solution Place 1 drop into the left eye 2 (two) times daily.   doxycycline (VIBRAMYCIN) 100 MG capsule Take 100 mg by mouth 2 (two) times daily.   empagliflozin (JARDIANCE) 25 MG TABS tablet Take 25 mg by mouth  daily.   fluticasone (FLONASE) 50 MCG/ACT nasal spray Place 1 spray into both nostrils at bedtime.   glipiZIDE (GLUCOTROL) 10 MG tablet Take 10 mg by mouth daily before breakfast.   ketorolac (ACULAR) 0.5 % ophthalmic solution 1 drop 4 (four) times daily.   letrozole (FEMARA) 2.5 MG tablet Take 2.5 mg by mouth daily.   Liraglutide (VICTOZA [place]) Inject 1.8 mg into the skin daily.   loratadine (CLARITIN) 10 MG tablet Take 10 mg by mouth daily.   metFORMIN (GLUCOPHAGE-XR) 500 MG 24 hr tablet Take 1,000 mg by mouth 2 (two) times daily.   nitroGLYCERIN (NITRODUR -  DOSED IN MG/24 HR) 0.1 mg/hr patch Place 0.1 mg onto the skin daily. Apply to top of both feet   pantoprazole (PROTONIX) 40 MG tablet TAKE 1 TABLET BY MOUTH ONCE DAILY BEFORE BREAKFAST   SANTYL 250 UNIT/GM ointment Apply 1 Application topically daily.   spironolactone (ALDACTONE) 25 MG tablet Take 1 tablet (25 mg total) by mouth daily.   valsartan (DIOVAN) 320 MG tablet Take 1 tablet (320 mg total) by mouth daily.    Allergies:   Sulfa antibiotics   Social History   Socioeconomic History   Marital status: Widowed    Spouse name: Not on file   Number of children: 4   Years of education: Not on file   Highest education level: Not on file  Occupational History   Occupation: Retired  Tobacco Use   Smoking status: Never    Passive exposure: Never   Smokeless tobacco: Never  Vaping Use   Vaping Use: Never used  Substance and Sexual Activity   Alcohol use: No   Drug use: Not on file   Sexual activity: Not on file  Other Topics Concern   Not on file  Social History Narrative   Lives with husband one daughter and two grandchildren.   Social Determinants of Health   Financial Resource Strain: Low Risk  (10/09/2022)   Overall Financial Resource Strain (CARDIA)    Difficulty of Paying Living Expenses: Not very hard  Food Insecurity: No Food Insecurity (10/09/2022)   Hunger Vital Sign    Worried About Running Out of Food in the Last Year: Never true    Ran Out of Food in the Last Year: Never true  Transportation Needs: No Transportation Needs (10/09/2022)   PRAPARE - Administrator, Civil Service (Medical): No    Lack of Transportation (Non-Medical): No  Physical Activity: Inactive (10/09/2022)   Exercise Vital Sign    Days of Exercise per Week: 0 days    Minutes of Exercise per Session: 0 min  Stress: Not on file  Social Connections: Not on file     Family History: The patient's family history includes Cancer in her mother; Leukemia in her sister. There is no  history of Colon cancer.  ROS:     Please see the history of present illness.    All other systems reviewed and are negative.  EKGs/Labs/Other Studies Reviewed:    The following studies were reviewed today:   EKG:  EKG is not ordered today.     Echocardiogram on 11/06/2020:  1. Left ventricular ejection fraction, by estimation, is 60 to 65%. The  left ventricle has normal function. The left ventricle has no regional  wall motion abnormalities. There is severe left ventricular hypertrophy.  Left ventricular diastolic parameters   are indeterminate.   2. Right ventricular systolic function is normal. The right ventricular  size is  normal.   3. Left atrial size was severely dilated.   4. Right atrial size was severely dilated.   5. The mitral valve is normal in structure. Trivial mitral valve  regurgitation. No evidence of mitral stenosis.   6. The tricuspid valve is abnormal. Tricuspid valve regurgitation is  moderate.   7. The aortic valve is tricuspid. There is mild calcification of the  aortic valve. There is mild thickening of the aortic valve. Aortic valve  regurgitation is not visualized. No aortic stenosis is present.   8. Mild pulmonary HTN, PASP is 32 mmHg.   9. The inferior vena cava is normal in size with greater than 50%  respiratory variability, suggesting right atrial pressure of 3 mmHg.   Comparison(s): Echocardiogram done 10/20/19 showed an EF of 65-70%.   Recent Labs: 10/09/2022: BUN 25; Creatinine, Ser 1.25; Potassium 4.0; Sodium 140; TSH 0.845  Recent Lipid Panel No results found for: "CHOL", "TRIG", "HDL", "CHOLHDL", "VLDL", "LDLCALC", "LDLDIRECT"   Risk Assessment/Calculations:    CHA2DS2-VASc Score = 6   This indicates a 9.7% annual risk of stroke. The patient's score is based upon: CHF History: 0 HTN History: 1 Diabetes History: 1 Stroke History: 0 Vascular Disease History: 1 Age Score: 2 Gender Score: 1   Physical Exam:    VS:  BP (!)  162/83 (BP Location: Left Arm, Patient Position: Sitting, Cuff Size: Normal)   Pulse 89   Wt 132 lb 6.4 oz (60.1 kg)   SpO2 100%   BMI 25.86 kg/m     Wt Readings from Last 3 Encounters:  10/21/22 132 lb 6.4 oz (60.1 kg)  10/13/22 129 lb (58.5 kg)  10/09/22 128 lb (58.1 kg)     GEN: Well nourished, well developed in no acute distress HEENT: Normal NECK: No JVD; No carotid bruits CARDIAC: S1/S2, irregular rhythm and regular rate, no murmurs, rubs, gallops; 2+ pulses except 1+ PT pulse along right foot RESPIRATORY:  Clear to auscultation without rales, wheezing or rhonchi  MUSCULOSKELETAL:  No edema; No deformity  SKIN: Warm and dry NEUROLOGIC:  Alert and oriented x 3 PSYCHIATRIC:  Normal affect   ASSESSMENT:    1. Resistant hypertension   2. Medication management   3. Atrial fibrillation, unspecified type   4. Pulmonary hypertension   5. OSA (obstructive sleep apnea)   6. PAD (peripheral artery disease)   7. Diabetic ulcer of right foot associated with type 2 diabetes mellitus, unspecified part of foot, unspecified ulcer stage     PLAN:    In order of problems listed above:  Resistant HTN, medication management Poorly controlled BP today. BP on arrival, 186/112. Repeat BP by me 162/83. Does admit to stress.  Hasn't taken Valsartan today. Discussed medication compliance. No change in  medications today. Discussed to monitor BP at home at least 2 hours after medications and sitting for 5-10 minutes. Given BP log and discussed to bring to next OV. Will obtain BMET for medication management in 1 week. Heart healthy diet and regular cardiovascular exercise encouraged. Follow-up with Gillian Shields, NP as scheduled.   2. A-fib HR well controlled. Denies any tachycardia or palpitations. Continue current medication regimen. Currently on appropriate dosage of Eliquis. Denies any bleeding issues. Heart healthy diet and regular cardiovascular exercise encouraged.   3. Pulmonary HTN,  OSA Past echo revealed mild pulmonary HTN, PASP was 32 mmHg.  This is likely secondary to her untreated OSA and diastolic dysfunction. Continue current medication regimen.  Has upcoming sleep study scheduled  soon. Continue to follow-up with Dr. Craige Cotta.   4.  PAD, foot ulcers ABI's performed of her lower extremities with [facility] that revealed severe bilateral lower extremity arterial occlusive disease. Seeing Dr. Trevor Mace at [facility] in [place], [place], recent procedure - do not have records - will request. Says she has some right foot ulcers, continue treatment plan. Continue to follow with PCP.   5.  Disposition: Follow-up as scheduled.    Medication Adjustments/Labs and Tests Ordered: Current medicines are reviewed at length with the patient today.  Concerns regarding medicines are outlined above.  Orders Placed This Encounter  Procedures   Basic metabolic panel   No orders of the defined types were placed in this encounter.   Patient Instructions  Medication Instructions:  Your physician recommends that you continue on your current medications as directed. Please refer to the Current Medication list given to you today.   Labwork: BMET in 1 week (4/16) [facility]  Testing/Procedures: none  Follow-Up:  Your physician recommends that you schedule a follow-up appointment in: keep upcoming appointment in May  Any Other Special Instructions Will Be Listed Below (If Applicable).  If you need a refill on your cardiac medications before your next appointment, please call your pharmacy.     Signed, Sharlene Dory, NP  10/21/2022 2:30 PM    [facility]

## 2022-10-22 ENCOUNTER — Ambulatory Visit: Payer: Medicare Other

## 2022-10-22 DIAGNOSIS — G4733 Obstructive sleep apnea (adult) (pediatric): Secondary | ICD-10-CM

## 2022-10-22 DIAGNOSIS — R0683 Snoring: Secondary | ICD-10-CM

## 2022-10-23 DIAGNOSIS — I70223 Atherosclerosis of native arteries of extremities with rest pain, bilateral legs: Secondary | ICD-10-CM | POA: Diagnosis not present

## 2022-10-24 ENCOUNTER — Telehealth (HOSPITAL_BASED_OUTPATIENT_CLINIC_OR_DEPARTMENT_OTHER): Payer: Self-pay | Admitting: Pulmonary Disease

## 2022-10-24 ENCOUNTER — Telehealth: Payer: Self-pay

## 2022-10-24 DIAGNOSIS — Z79899 Other long term (current) drug therapy: Secondary | ICD-10-CM | POA: Diagnosis not present

## 2022-10-24 NOTE — Telephone Encounter (Signed)
HST 10/22/22 >> AHI 35.9, SpO2 low 82%   Please inform her that her sleep study shows severe obstructive sleep apnea.  Please arrange for ROV with me or NP to discuss treatment options.

## 2022-10-24 NOTE — Progress Notes (Signed)
Record request sent to Dr. Gwynneth Aliment, [place], [place] (fax # [phone])

## 2022-10-24 NOTE — Telephone Encounter (Signed)
-----   Message from Sharlene Dory, NP sent at 10/22/2022  5:40 PM EDT ----- Please request records from Dr. Trevor Mace from [facility] in [place] about past procedure for PAD.   Thanks!   Best,  Sharlene Dory, NP

## 2022-10-27 DIAGNOSIS — Z79811 Long term (current) use of aromatase inhibitors: Secondary | ICD-10-CM | POA: Diagnosis not present

## 2022-10-27 DIAGNOSIS — Z170 Estrogen receptor positive status [ER+]: Secondary | ICD-10-CM | POA: Diagnosis not present

## 2022-10-27 DIAGNOSIS — C50411 Malignant neoplasm of upper-outer quadrant of right female breast: Secondary | ICD-10-CM | POA: Diagnosis not present

## 2022-10-30 NOTE — Telephone Encounter (Signed)
ATC pt LVM for her to call office back.  

## 2022-11-05 ENCOUNTER — Ambulatory Visit: Payer: Medicare Other | Attending: Family

## 2022-11-05 DIAGNOSIS — I6529 Occlusion and stenosis of unspecified carotid artery: Secondary | ICD-10-CM | POA: Insufficient documentation

## 2022-11-05 DIAGNOSIS — G4733 Obstructive sleep apnea (adult) (pediatric): Secondary | ICD-10-CM

## 2022-11-10 DIAGNOSIS — I739 Peripheral vascular disease, unspecified: Secondary | ICD-10-CM | POA: Diagnosis not present

## 2022-11-10 DIAGNOSIS — L97511 Non-pressure chronic ulcer of other part of right foot limited to breakdown of skin: Secondary | ICD-10-CM | POA: Diagnosis not present

## 2022-11-24 NOTE — Telephone Encounter (Signed)
Called and spoke w/ pt scheduled her for 6/18 @ 8:30am w/ VS. NFN att.

## 2022-11-27 ENCOUNTER — Encounter (HOSPITAL_BASED_OUTPATIENT_CLINIC_OR_DEPARTMENT_OTHER): Payer: Self-pay | Admitting: Family

## 2022-11-27 ENCOUNTER — Ambulatory Visit (INDEPENDENT_AMBULATORY_CARE_PROVIDER_SITE_OTHER): Payer: Medicare Other | Admitting: Family

## 2022-11-27 VITALS — BP 158/102 | HR 67 | Ht 60.0 in | Wt 132.0 lb

## 2022-11-27 DIAGNOSIS — G4733 Obstructive sleep apnea (adult) (pediatric): Secondary | ICD-10-CM | POA: Diagnosis not present

## 2022-11-27 DIAGNOSIS — I4821 Permanent atrial fibrillation: Secondary | ICD-10-CM | POA: Diagnosis not present

## 2022-11-27 DIAGNOSIS — D6859 Other primary thrombophilia: Secondary | ICD-10-CM

## 2022-11-27 DIAGNOSIS — I2510 Atherosclerotic heart disease of native coronary artery without angina pectoris: Secondary | ICD-10-CM | POA: Diagnosis not present

## 2022-11-27 DIAGNOSIS — I1A0 Resistant hypertension: Secondary | ICD-10-CM | POA: Diagnosis not present

## 2022-11-27 MED ORDER — DOXAZOSIN MESYLATE 2 MG PO TABS
2.0000 mg | ORAL_TABLET | Freq: Every day | ORAL | 1 refills | Status: DC
Start: 2022-11-27 — End: 2023-04-03

## 2022-11-27 NOTE — Patient Instructions (Addendum)
Medication Instructions:  Your physician has recommended you make the following change in your medication:   STOP Spironolactone    START Doxazosin 2mg  every evening  INCREASE Carvedilol CR to 40mg  daily  Please be sure to take your Diltiazem 360mg , Carvedilol 40mg , and Valsartan 320mg  every morning.   Follow-Up:  In 6 weeks with [facility] with Dr. Duke Salvia, Gillian Shields, NP, or pharmacy team AND In 6 months with Dr. Wyline Mood or Sharlene Dory, NP    Special Instructions:   Please bring your blood pressure cuff, blood pressure log, and your pill bottles to your next office visit

## 2022-11-27 NOTE — Progress Notes (Signed)
Advanced [facility]pertension Clinic Initial Assessment:    Date:  11/27/2022   ID:  Betty Goodman, DOB [birth year], MRN [id number]  PCP:  Encarnacion Slates, PA-C  Cardiologist:  Dina Rich, MD  Nephrologist:  Referring MD: Encarnacion Slates, PA-C   CC: Hypertension  History of Present Illness:    Betty Goodman is a 77 y.o. female with a hx of atrial fibrillation, resistant hypertension, coronary artery calcification on CT, pulmonary hypertension, DM2, breast cancer, OSA, PAD here to follow up in the Advanced [facility]pertension Clinic.   She saw Sharlene Dory, NP in our [place] office and given persistent difficulties controlling hypertension was referred to Advanced Hypertension Clinic. Her Coreg CR was increased to 40mg  daily. Aldactone, Diltiazem, Losartan continued. Renal duplex 09/04/22 with no renal artery stenosis.   At initial Hypertension Clinic visit 10/09/22 it was noted Betty Goodman was diagnosed with hypertension in the late 1990s. It has been difficult to control. No tobacco nor etoh use.  No formal exercise routine but was following low-sodium, heart of the diet.  At that visit losartan was stopped and she was started on valsartan 320 mg daily.  Spironolactone was reduced to 25 mg daily and moved to morning dosing due to dry mouth.  Carotid duplex ordered with no carotid stenosis.  She had office visit with Sharlene Dory, NP 10/21/2022 with elevated BP 186/112 and repeat 162/83.  She had not yet taken her valsartan.  She was following with Dr. Darene Lamer at [facility] and had recent procedure, details unknown and records requested.   She underwent sleep study which revealed severe sleep apnea and has upcoming visit with pulmonology to discuss treatment options.  Presents today for follow-up. Her chief complaint is dry mouth which she attributes to Spironolactone despite reduced dose. She self reduced Carvedilol from 40mg  to 20mg  QD as she was not certain if it was causing the dry mouth.   She  took her Valsartan yesterday morning but has not yet taken this morning. She took Coreg 20mg , Spironolactone 25mg , Diltiazem 360mg  this morning. She is monitoring BP at home with readings average 139/97 per her report - did not bring specific readings today. Last week blood pressure as low as 126/86. Notes she has been eating a higher sodium diet over the last week or two as eating out more.   Reports no shortness of breath nor dyspnea on exertion. Reports no chest pain, pressure, or tightness. No edema, orthopnea, PND. Reports no palpitations.     Previous antihypertensives: Chlorthalidone - does not recall taking Clonidine - tolerated fine Lisinopril-HCTZ - switched to ARB Amlodipine - switched to Diltiazem for rate control Losartan-transition to valsartan Spironolactone - dry mouth  Past Medical History:  Diagnosis Date   Atrial fibrillation (HCC)    Breast cancer (HCC)    Stage IB (pT1, pN26micro, cM0) right breast cancer   Hypertension    Kidney stone    resulting in stenting   Type 2 diabetes mellitus (HCC)    Unspecified essential hypertension    Past Surgical History:  Procedure Laterality Date   ADENOIDECTOMY     COLONOSCOPY  08/17/2019   Dr. Aurea Graff; Mild nonbleeding diverticulosis in the left colon. Recommended consider repeat in 5-10 years given questionable maternal grandmother with colon cancer   TONSILLECTOMY     TUBAL LIGATION     Current Medications: Current Meds  Medication Sig   alendronate (FOSAMAX) 70 MG tablet Take 70 mg by mouth once a week. Take with  a full glass of water on an empty stomach.   apixaban (ELIQUIS) 5 MG TABS tablet Take 1 tablet (5 mg total) by mouth 2 (two) times daily.   atorvastatin (LIPITOR) 40 MG tablet Take 40 mg by mouth at bedtime.   azelastine (ASTELIN) 0.1 % nasal spray Place 1 spray into both nostrils every morning. Use in each nostril as directed   brimonidine-timolol (COMBIGAN) 0.2-0.5 % ophthalmic solution Place 1 drop into  both eyes every 12 (twelve) hours.   carvedilol (COREG CR) 40 MG 24 hr capsule Take 1 capsule (40 mg total) by mouth daily.   Cholecalciferol (VITAMIN D) 50 MCG (2000 UT) CAPS Take 2 capsules by mouth 2 (two) times daily.   diltiazem (CARDIZEM CD) 360 MG 24 hr capsule Take 360 mg by mouth daily.   dorzolamide (TRUSOPT) 2 % ophthalmic solution Place 1 drop into the left eye 2 (two) times daily.   doxazosin (CARDURA) 2 MG tablet Take 1 tablet (2 mg total) by mouth daily.   doxycycline (VIBRAMYCIN) 100 MG capsule Take 100 mg by mouth 2 (two) times daily.   empagliflozin (JARDIANCE) 25 MG TABS tablet Take 25 mg by mouth daily.   fluticasone (FLONASE) 50 MCG/ACT nasal spray Place 1 spray into both nostrils at bedtime.   glipiZIDE (GLUCOTROL) 10 MG tablet Take 10 mg by mouth daily before breakfast.   ketorolac (ACULAR) 0.5 % ophthalmic solution 1 drop 4 (four) times daily.   letrozole (FEMARA) 2.5 MG tablet Take 2.5 mg by mouth daily.   Liraglutide (VICTOZA [place]) Inject 1.8 mg into the skin daily.   loratadine (CLARITIN) 10 MG tablet Take 10 mg by mouth daily.   metFORMIN (GLUCOPHAGE-XR) 500 MG 24 hr tablet Take 1,000 mg by mouth 2 (two) times daily.   pantoprazole (PROTONIX) 40 MG tablet TAKE 1 TABLET BY MOUTH ONCE DAILY BEFORE BREAKFAST   SANTYL 250 UNIT/GM ointment Apply 1 Application topically daily.   valsartan (DIOVAN) 320 MG tablet Take 1 tablet (320 mg total) by mouth daily.   [DISCONTINUED] spironolactone (ALDACTONE) 25 MG tablet Take 1 tablet (25 mg total) by mouth daily.     Allergies:   Sulfa antibiotics   Social History   Socioeconomic History   Marital status: Widowed    Spouse name: Not on file   Number of children: 4   Years of education: Not on file   Highest education level: Not on file  Occupational History   Occupation: Retired  Tobacco Use   Smoking status: Never    Passive exposure: Never   Smokeless tobacco: Never  Vaping Use   Vaping Use: Never used  Substance  and Sexual Activity   Alcohol use: No   Drug use: Not on file   Sexual activity: Not on file  Other Topics Concern   Not on file  Social History Narrative   Lives with husband one daughter and two grandchildren.   Social Determinants of Health   Financial Resource Strain: Low Risk  (10/09/2022)   Overall Financial Resource Strain (CARDIA)    Difficulty of Paying Living Expenses: Not very hard  Food Insecurity: No Food Insecurity (10/09/2022)   Hunger Vital Sign    Worried About Running Out of Food in the Last Year: Never true    Ran Out of Food in the Last Year: Never true  Transportation Needs: No Transportation Needs (10/09/2022)   PRAPARE - Administrator, Civil Service (Medical): No    Lack of Transportation (Non-Medical): No  Physical Activity: Inactive (10/09/2022)   Exercise Vital Sign    Days of Exercise per Week: 0 days    Minutes of Exercise per Session: 0 min  Stress: Not on file  Social Connections: Not on file     Family History: The patient's family history includes Cancer in her mother; Leukemia in her sister. There is no history of Colon cancer.  ROS:   Please see the history of present illness.     All other systems reviewed and are negative.  EKGs/Labs/Other Studies Reviewed:    EKG:  EKG is not ordered today.    Cardiac Studies & Procedures       ECHOCARDIOGRAM  ECHOCARDIOGRAM COMPLETE 11/06/2020  Narrative ECHOCARDIOGRAM REPORT    Patient Name:   ACHSAH GILYARD Date of Exam: 11/06/2020 Medical Rec #:  [id number]      Height:       60.0 in Accession #:    [id number]     Weight:       151.4 lb Date of Birth:  10-30-1945     BSA:          1.658 m Patient Age:    74 years       BP:           128/82 mmHg Patient Gender: F              HR:           95 bpm. Exam Location:  [place]  Procedure: 2D Echo, Cardiac Doppler and Color Doppler  Indications:    I27.20 Pulmonary hypertension  History:        Patient has prior history of  Echocardiogram examinations, most recent 10/20/2019. Abnormal ECG, Pulmonary HTN and CKD, Arrythmias:Atrial Fibrillation, Signs/Symptoms:Shortness of Breath; Risk Factors:Hypertension, Diabetes, Dyslipidemia, Non-Smoker and Sleep Apnea.  Sonographer:    Jake Seats RDMS, RVT, RDCS Referring Phys: [id number] Netta Neat.  IMPRESSIONS   1. Left ventricular ejection fraction, by estimation, is 60 to 65%. The left ventricle has normal function. The left ventricle has no regional wall motion abnormalities. There is severe left ventricular hypertrophy. Left ventricular diastolic parameters are indeterminate. 2. Right ventricular systolic function is normal. The right ventricular size is normal. 3. Left atrial size was severely dilated. 4. Right atrial size was severely dilated. 5. The mitral valve is normal in structure. Trivial mitral valve regurgitation. No evidence of mitral stenosis. 6. The tricuspid valve is abnormal. Tricuspid valve regurgitation is moderate. 7. The aortic valve is tricuspid. There is mild calcification of the aortic valve. There is mild thickening of the aortic valve. Aortic valve regurgitation is not visualized. No aortic stenosis is present. 8. Mild pulmonary HTN, PASP is 32 mmHg. 9. The inferior vena cava is normal in size with greater than 50% respiratory variability, suggesting right atrial pressure of 3 mmHg.  Comparison(s): Echocardiogram done 10/20/19 showed an EF of 65-70%.  FINDINGS Left Ventricle: Left ventricular ejection fraction, by estimation, is 60 to 65%. The left ventricle has normal function. The left ventricle has no regional wall motion abnormalities. Global longitudinal strain performed but not reported based on interpreter judgement due to suboptimal tracking. The left ventricular internal cavity size was normal in size. There is severe left ventricular hypertrophy. Left ventricular diastolic parameters are indeterminate.  Right Ventricle: The  right ventricular size is normal. No increase in right ventricular wall thickness. Right ventricular systolic function is normal.  Left Atrium: Left atrial size was severely dilated.  Right Atrium:  Right atrial size was severely dilated.  Pericardium: There is no evidence of pericardial effusion.  Mitral Valve: The mitral valve is normal in structure. Trivial mitral valve regurgitation. No evidence of mitral valve stenosis.  Tricuspid Valve: The tricuspid valve is abnormal. Tricuspid valve regurgitation is moderate . No evidence of tricuspid stenosis.  Aortic Valve: The aortic valve is tricuspid. There is mild calcification of the aortic valve. There is mild thickening of the aortic valve. There is mild aortic valve annular calcification. Aortic valve regurgitation is not visualized. No aortic stenosis is present. Aortic valve mean gradient measures 3.1 mmHg. Aortic valve peak gradient measures 6.0 mmHg. Aortic valve area, by VTI measures 1.49 cm.  Pulmonic Valve: The pulmonic valve was not well visualized. Pulmonic valve regurgitation is mild. No evidence of pulmonic stenosis.  Aorta: The aortic root is normal in size and structure.  Pulmonary Artery: Mild pulmonary HTN, PASP is 32 mmHg.  Venous: The inferior vena cava is normal in size with greater than 50% respiratory variability, suggesting right atrial pressure of 3 mmHg.  IAS/Shunts: No atrial level shunt detected by color flow Doppler.   LEFT VENTRICLE PLAX 2D LVIDd:         2.93 cm     Diastology LVIDs:         2.09 cm     LV e' medial:    4.72 cm/s LV PW:         1.50 cm     LV E/e' medial:  18.0 LV IVS:        1.61 cm     LV e' lateral:   8.20 cm/s LVOT diam:     1.70 cm     LV E/e' lateral: 10.4 LV SV:         27 LV SV Index:   16 LVOT Area:     2.27 cm  LV Volumes (MOD) LV vol d, MOD A2C: 50.8 ml LV vol d, MOD A4C: 46.9 ml LV vol s, MOD A2C: 26.2 ml LV vol s, MOD A4C: 23.4 ml LV SV MOD A2C:     24.6 ml LV SV  MOD A4C:     46.9 ml LV SV MOD BP:      25.0 ml  RIGHT VENTRICLE RV S prime:     11.25 cm/s TAPSE (M-mode): 1.6 cm  LEFT ATRIUM             Index       RIGHT ATRIUM           Index LA diam:        3.90 cm 2.35 cm/m  RA Area:     23.40 cm LA Vol (A2C):   64.3 ml 38.77 ml/m RA Volume:   70.60 ml  42.57 ml/m LA Vol (A4C):   74.7 ml 45.05 ml/m LA Biplane Vol: 74.6 ml 44.99 ml/m AORTIC VALVE AV Area (Vmax):    1.43 cm AV Area (Vmean):   1.35 cm AV Area (VTI):     1.49 cm AV Vmax:           122.17 cm/s AV Vmean:          82.179 cm/s AV VTI:            0.178 m AV Peak Grad:      6.0 mmHg AV Mean Grad:      3.1 mmHg LVOT Vmax:         76.83 cm/s LVOT Vmean:  48.867 cm/s LVOT VTI:          0.117 m LVOT/AV VTI ratio: 0.66  AORTA Ao Root diam: 2.90 cm  MITRAL VALVE               TRICUSPID VALVE MV Area (PHT):             TR Peak grad:   28.5 mmHg MV Decel Time: 174 msec    TR Vmax:        267.00 cm/s MR Peak grad: 12.1 mmHg MR Vmax:      174.00 cm/s  SHUNTS MV E velocity: 85.13 cm/s  Systemic VTI:  0.12 m Systemic Diam: 1.70 cm  Dina Rich MD Electronically signed by Dina Rich MD Signature Date/Time: 11/06/2020/2:58:54 PM    Final              Recent Labs: 10/09/2022: BUN 25; Creatinine, Ser 1.25; Potassium 4.0; Sodium 140; TSH 0.845   Recent Lipid Panel No results found for: "CHOL", "TRIG", "HDL", "CHOLHDL", "VLDL", "LDLCALC", "LDLDIRECT"  Physical Exam:   VS:  BP (!) 158/102   Pulse 67   Ht 5' (1.524 m)   Wt 132 lb (59.9 kg)   BMI 25.78 kg/m  , BMI Body mass index is 25.78 kg/m. GENERAL:  Well appearing HEENT: Pupils equal round and reactive, fundi not visualized, oral mucosa unremarkable NECK:  No jugular venous distention, waveform within normal limits, carotid upstroke brisk and symmetric, no bruits, no thyromegaly LYMPHATICS:  No cervical adenopathy LUNGS:  Clear to auscultation bilaterally HEART:  IRIR.  PMI not displaced or  sustained,S1 and S2 within normal limits, no S3, no S4, no clicks, no rubs, no murmurs ABD:  Flat, positive bowel sounds normal in frequency in pitch, no bruits, no rebound, no guarding, no midline pulsatile mass, no hepatomegaly, no splenomegaly EXT:  R PT 2+, L PT 1+, no edema, no cyanosis no clubbing SKIN:  No rashes no nodules NEURO:  Cranial nerves II through XII grossly intact, motor grossly intact throughout PSYCH:  Cognitively intact, oriented to person place and time   ASSESSMENT/PLAN:    HTN - BP not at goal <130/80.   She self reduced Coreg from 40mg  to 20mg  as she worried it was contributory to dry mouth. More likely related to Spironolactone, will discontinue Spironolactone. Encouraged her to increase Carvedilol CR to 40mg  as previously recommended. Start Doxazosin 2 mg every evening. Can further increase at follow up as clinically indicated. CT abdomen 03/25/22 adrenal glands unremarkable. No evidence of pheochromocytoma. Sleep study with severe sleep apnea and upcoming visit with pulmonology to discuss treatment modalities. Contributory to uncontrolled BP.  Carotid duplex 11/05/2022 with no stenosis.  Renal duplex 09/04/2022 with no renal artery stenosis.  10/01/2022 normal thyroid function. Previously politely declined referral to PREP exercise program  Pulmonary hypertension / OSA - Sleep study severe OSA. Has upcoming visit with Dr. Craige Cotta to discuss treatment modalities.   Coronary calcification on CT / HLD - Stable with no anginal symptoms. No indication for ischemic evaluation.  GDMT Atorvastatin, Carvedilol, Jardiance. No ASA due to [facility]. Heart healthy diet and regular cardiovascular exercise encouraged.    PAD - Follows with vascular provider in [place]. Reports no claudication symptoms. Reports "they did the balloons and there's nothing else they can do". Notes from [facility] in [place] unavailable.  Atrial fibrillation / Hypercoagulable state - Rate controlled today by  auscultation. Continue Diltiazem 360mg  BID, Eliquis 5mg  BID. Does not meet dose reduction criteria. Denies  bleeding complications on Eliquis.   DM2 - Continue to follow with PCP. Appreciate inclusion of SLT2i for cardioprotective benefit.   Screening for Secondary Hypertension:     10/09/2022    9:44 AM  Causes  Drugs/Herbals Screened     - Comments No OTC agents, one 16 oz cup of coffee  Renovascular HTN Screened     - Comments 2024 renal duplex no stenosis  Sleep Apnea Screened     - Comments 10/2022 OV with Dr. Craige Cotta to establish  Thyroid Disease Screened     - Comments 10/09/22 thyroid panel  Hyperaldosteronism Not Screened     - Comments Unable to screen due to being on ARB - already on Spiro  Pheochromocytoma N/A     - Comments CT 03/2022 normal adrenals  Cushing's Syndrome Screened     - Comments Non cushingoid appearance  Coarctation of the Aorta Screened     - Comments 09/2022 carotid duplex ordered  Compliance Screened     - Comments takes medications regularly, gets through the [place]    Relevant Labs/Studies:    Latest Ref Rng & Units 10/09/2022   10:03 AM 03/01/2020   11:52 AM 09/19/2013    2:27 PM  Basic Labs  Sodium 134 - 144 mmol/L 140  142  143   Potassium 3.5 - 5.2 mmol/L 4.0  3.6  3.9   Creatinine 0.57 - 1.00 mg/dL 8.65  7.84  6.96        Latest Ref Rng & Units 10/09/2022   10:03 AM  Thyroid   TSH 0.450 - 4.500 uIU/mL 0.845                 09/04/2022    9:27 AM  Renovascular   Renal Artery [place] Completed Yes    Disposition:    FU with MD/PharmD in 6 weeks  and in 6 months with Dr. Wyline Mood or Sharlene Dory, NP.   Medication Adjustments/Labs and Tests Ordered: Current medicines are reviewed at length with the patient today.  Concerns regarding medicines are outlined above.  No orders of the defined types were placed in this encounter.  Meds ordered this encounter  Medications   doxazosin (CARDURA) 2 MG tablet    Sig: Take 1 tablet (2 mg total) by mouth  daily.    Dispense:  30 tablet    Refill:  1    Order Specific Question:   Supervising Provider    Answer:   Jodelle Red [[id number]]   Signed, Alver Sorrow, NP  11/27/2022 9:58 AM     [facility]

## 2022-12-04 DIAGNOSIS — I70223 Atherosclerosis of native arteries of extremities with rest pain, bilateral legs: Secondary | ICD-10-CM | POA: Diagnosis not present

## 2022-12-09 DIAGNOSIS — M533 Sacrococcygeal disorders, not elsewhere classified: Secondary | ICD-10-CM | POA: Diagnosis not present

## 2022-12-09 DIAGNOSIS — M4807 Spinal stenosis, lumbosacral region: Secondary | ICD-10-CM | POA: Diagnosis not present

## 2022-12-09 DIAGNOSIS — M47817 Spondylosis without myelopathy or radiculopathy, lumbosacral region: Secondary | ICD-10-CM | POA: Diagnosis not present

## 2022-12-09 DIAGNOSIS — M4317 Spondylolisthesis, lumbosacral region: Secondary | ICD-10-CM | POA: Diagnosis not present

## 2022-12-16 DIAGNOSIS — L97511 Non-pressure chronic ulcer of other part of right foot limited to breakdown of skin: Secondary | ICD-10-CM | POA: Diagnosis not present

## 2022-12-16 DIAGNOSIS — I739 Peripheral vascular disease, unspecified: Secondary | ICD-10-CM | POA: Diagnosis not present

## 2022-12-25 DIAGNOSIS — M4317 Spondylolisthesis, lumbosacral region: Secondary | ICD-10-CM | POA: Diagnosis not present

## 2022-12-25 DIAGNOSIS — S3992XS Unspecified injury of lower back, sequela: Secondary | ICD-10-CM | POA: Diagnosis not present

## 2022-12-25 DIAGNOSIS — M4807 Spinal stenosis, lumbosacral region: Secondary | ICD-10-CM | POA: Diagnosis not present

## 2022-12-25 DIAGNOSIS — M47817 Spondylosis without myelopathy or radiculopathy, lumbosacral region: Secondary | ICD-10-CM | POA: Diagnosis not present

## 2022-12-26 DIAGNOSIS — I10 Essential (primary) hypertension: Secondary | ICD-10-CM | POA: Diagnosis not present

## 2022-12-26 DIAGNOSIS — Z6823 Body mass index (BMI) 23.0-23.9, adult: Secondary | ICD-10-CM | POA: Diagnosis not present

## 2022-12-26 DIAGNOSIS — N189 Chronic kidney disease, unspecified: Secondary | ICD-10-CM | POA: Diagnosis not present

## 2022-12-29 DIAGNOSIS — E7849 Other hyperlipidemia: Secondary | ICD-10-CM | POA: Diagnosis not present

## 2022-12-29 DIAGNOSIS — E782 Mixed hyperlipidemia: Secondary | ICD-10-CM | POA: Diagnosis not present

## 2022-12-29 DIAGNOSIS — I10 Essential (primary) hypertension: Secondary | ICD-10-CM | POA: Diagnosis not present

## 2022-12-29 DIAGNOSIS — D649 Anemia, unspecified: Secondary | ICD-10-CM | POA: Diagnosis not present

## 2022-12-29 DIAGNOSIS — E1165 Type 2 diabetes mellitus with hyperglycemia: Secondary | ICD-10-CM | POA: Diagnosis not present

## 2022-12-29 DIAGNOSIS — E876 Hypokalemia: Secondary | ICD-10-CM | POA: Diagnosis not present

## 2022-12-30 ENCOUNTER — Ambulatory Visit (INDEPENDENT_AMBULATORY_CARE_PROVIDER_SITE_OTHER): Payer: Medicare Other | Admitting: Pulmonary Disease

## 2022-12-30 ENCOUNTER — Encounter (HOSPITAL_BASED_OUTPATIENT_CLINIC_OR_DEPARTMENT_OTHER): Payer: Self-pay | Admitting: Pulmonary Disease

## 2022-12-30 VITALS — BP 160/82 | HR 63 | Temp 98.1°F | Ht 60.0 in | Wt 129.4 lb

## 2022-12-30 DIAGNOSIS — G4733 Obstructive sleep apnea (adult) (pediatric): Secondary | ICD-10-CM

## 2022-12-30 DIAGNOSIS — Z7189 Other specified counseling: Secondary | ICD-10-CM

## 2022-12-30 NOTE — Patient Instructions (Signed)
Will arrange for auto CPAP set up  Follow up in 4 months 

## 2022-12-30 NOTE — Progress Notes (Signed)
Pulmonary, Critical Care, and Sleep Medicine  Chief Complaint  Patient presents with   Follow-up    Follow up. Patient has no complaints.     Past Surgical History:  She  has a past surgical history that includes Tubal ligation; Tonsillectomy; Adenoidectomy; and Colonoscopy (08/17/2019).  Past Medical History:  Persistent A fib, Nephrolithiasis, DM 2, HTN, Rt breast cancer 2020   Constitutional:  BP (!) 160/82 (BP Location: Left Arm, Patient Position: Sitting, Cuff Size: Normal)   Pulse 63   Temp 98.1 F (36.7 C) (Oral)   Ht 5' (1.524 m)   Wt 129 lb 6.4 oz (58.7 kg)   SpO2 94%   BMI 25.27 kg/m   Brief Summary:  Betty Goodman is a 77 y.o. female with obstructive sleep apnea and pulmonary hypertension.      Subjective:   Her home sleep study from April 2024 showed severe obstructive sleep apnea.    She was told that she can't get an Inspire device because of her cardiac issues.  Her blood pressure has been fluctuating.  Physical Exam:   Appearance - well kempt   ENMT - no sinus tenderness, no oral exudate, no LAN, Mallampati 3 airway, no stridor, poor dentition, deviated nasal septum  Respiratory - equal breath sounds bilaterally, no wheezing or rales  CV - irregular  Ext - no clubbing, no edema  Skin - no rashes  Psych - normal mood and affect   Pulmonary testing:  PFT 04/12/20 >> FEV1 2.01 (132%), FEV1% 83, TLC 5.07 (110%), DLCO 95%   Chest Imaging:  CT chest 03/25/22 >> 0.4 cm nodule RUL, coronary atherosclerosis, subpleural radiation fibrosis in RUL and RML  Sleep Tests:  PSG 02/04/20 >> AHI 16.1, SpO2 low 82%  HST 10/22/22 >> AHI 35.9, SpO2 low 82%   Cardiac Tests:  Echo 11/06/20 >> EF 60 to 65%, severe LVH, severe LA and RA dilation, mod TR  Social History:  She  reports that she has never smoked. She has never been exposed to tobacco smoke. She has never used smokeless tobacco. She reports that she does not drink alcohol.  Family  History:  Her family history includes Cancer in her mother; Leukemia in her sister.     Assessment/Plan:   Obstructive sleep apnea. - reviewed her sleep study - discussed how sleep apnea can impact her health - treatment options reviewed; not candidate for oral appliance or hypoglossal nerve stimulator - will arrange for auto CPAP set up - she would likely do better with a nasal pillow or nasal cushion mask  Chronic rhinitis with deviated nasal septum. - continue azelastine and flonase prn - prn nasal irrigation - she would not want to have surgical intervention for this  Pulmonary hypertension. - related to diastolic CHF and sleep disordered breathing  Atrial fibrillation, refractory hypertension. - followed by Dr. Patrick Jupiter with cardiology  Rt breast cancer s/p radiation therapy. - followed by Dr. Basil Dess with onc[facility] at [facility]  Time Spent Involved in Patient Care on Day of Examination:  27 minutes  Follow up:   Patient Instructions  Will arrange for auto CPAP set up  Follow up in 4 months  Medication List:   Allergies as of 12/30/2022       Reactions   Sulfa Antibiotics         Medication List        Accurate as of December 30, 2022  8:52 AM. If you have any questions, ask your  nurse or doctor.          alendronate 70 MG tablet Commonly known as: FOSAMAX Take 70 mg by mouth once a week. Take with a full glass of water on an empty stomach.   apixaban 5 MG Tabs tablet Commonly known as: ELIQUIS Take 1 tablet (5 mg total) by mouth 2 (two) times daily.   atorvastatin 40 MG tablet Commonly known as: LIPITOR Take 40 mg by mouth at bedtime.   azelastine 0.1 % nasal spray Commonly known as: ASTELIN Place 1 spray into both nostrils every morning. Use in each nostril as directed   carvedilol 40 MG 24 hr capsule Commonly known as: COREG CR Take 1 capsule (40 mg total) by mouth daily.   Combigan 0.2-0.5 % ophthalmic solution Generic drug:  brimonidine-timolol Place 1 drop into both eyes every 12 (twelve) hours.   diltiazem 360 MG 24 hr capsule Commonly known as: CARDIZEM CD Take 360 mg by mouth daily.   dorzolamide 2 % ophthalmic solution Commonly known as: TRUSOPT Place 1 drop into the left eye 2 (two) times daily.   doxazosin 2 MG tablet Commonly known as: Cardura Take 1 tablet (2 mg total) by mouth daily.   doxycycline 100 MG capsule Commonly known as: VIBRAMYCIN Take 100 mg by mouth 2 (two) times daily.   fluticasone 50 MCG/ACT nasal spray Commonly known as: FLONASE Place 1 spray into both nostrils at bedtime.   glipiZIDE 10 MG tablet Commonly known as: GLUCOTROL Take 10 mg by mouth daily before breakfast.   Jardiance 25 MG Tabs tablet Generic drug: empagliflozin Take 25 mg by mouth daily.   ketorolac 0.5 % ophthalmic solution Commonly known as: ACULAR 1 drop 4 (four) times daily.   letrozole 2.5 MG tablet Commonly known as: FEMARA Take 2.5 mg by mouth daily.   loratadine 10 MG tablet Commonly known as: CLARITIN Take 10 mg by mouth daily.   metFORMIN 500 MG 24 hr tablet Commonly known as: GLUCOPHAGE-XR Take 1,000 mg by mouth 2 (two) times daily.   nitroGLYCERIN 0.1 mg/hr patch Commonly known as: NITRODUR - Dosed in mg/24 hr Place 0.1 mg onto the skin daily. Apply to top of both feet   pantoprazole 40 MG tablet Commonly known as: PROTONIX TAKE 1 TABLET BY MOUTH ONCE DAILY BEFORE BREAKFAST   Santyl 250 UNIT/GM ointment Generic drug: collagenase Apply 1 Application topically daily.   valsartan 320 MG tablet Commonly known as: Diovan Take 1 tablet (320 mg total) by mouth daily.   VICTOZA [place] Inject 1.8 mg into the skin daily.   Vitamin D 50 MCG (2000 UT) Caps Take 2 capsules by mouth 2 (two) times daily.        Signature:  Coralyn Helling, MD [facility] Pulmonary/Critical Care Pager - [phone] 12/30/2022, 8:52 AM

## 2023-01-05 DIAGNOSIS — I739 Peripheral vascular disease, unspecified: Secondary | ICD-10-CM | POA: Diagnosis not present

## 2023-01-05 DIAGNOSIS — L97511 Non-pressure chronic ulcer of other part of right foot limited to breakdown of skin: Secondary | ICD-10-CM | POA: Diagnosis not present

## 2023-01-07 ENCOUNTER — Ambulatory Visit (HOSPITAL_BASED_OUTPATIENT_CLINIC_OR_DEPARTMENT_OTHER): Payer: Medicare Other | Admitting: Family

## 2023-01-07 DIAGNOSIS — R748 Abnormal levels of other serum enzymes: Secondary | ICD-10-CM | POA: Diagnosis not present

## 2023-01-07 DIAGNOSIS — E782 Mixed hyperlipidemia: Secondary | ICD-10-CM | POA: Diagnosis not present

## 2023-01-07 DIAGNOSIS — I272 Pulmonary hypertension, unspecified: Secondary | ICD-10-CM | POA: Diagnosis not present

## 2023-01-07 DIAGNOSIS — N184 Chronic kidney disease, stage 4 (severe): Secondary | ICD-10-CM | POA: Diagnosis not present

## 2023-01-07 DIAGNOSIS — R030 Elevated blood-pressure reading, without diagnosis of hypertension: Secondary | ICD-10-CM | POA: Diagnosis not present

## 2023-01-07 DIAGNOSIS — R944 Abnormal results of kidney function studies: Secondary | ICD-10-CM | POA: Diagnosis not present

## 2023-01-07 DIAGNOSIS — H401111 Primary open-angle glaucoma, right eye, mild stage: Secondary | ICD-10-CM | POA: Diagnosis not present

## 2023-01-07 DIAGNOSIS — E1122 Type 2 diabetes mellitus with diabetic chronic kidney disease: Secondary | ICD-10-CM | POA: Diagnosis not present

## 2023-01-07 DIAGNOSIS — C50911 Malignant neoplasm of unspecified site of right female breast: Secondary | ICD-10-CM | POA: Diagnosis not present

## 2023-01-07 DIAGNOSIS — H401122 Primary open-angle glaucoma, left eye, moderate stage: Secondary | ICD-10-CM | POA: Diagnosis not present

## 2023-01-07 DIAGNOSIS — R911 Solitary pulmonary nodule: Secondary | ICD-10-CM | POA: Diagnosis not present

## 2023-01-07 DIAGNOSIS — I10 Essential (primary) hypertension: Secondary | ICD-10-CM | POA: Diagnosis not present

## 2023-01-07 DIAGNOSIS — R2689 Other abnormalities of gait and mobility: Secondary | ICD-10-CM | POA: Diagnosis not present

## 2023-01-09 DIAGNOSIS — M47816 Spondylosis without myelopathy or radiculopathy, lumbar region: Secondary | ICD-10-CM | POA: Diagnosis not present

## 2023-01-09 DIAGNOSIS — D270 Benign neoplasm of right ovary: Secondary | ICD-10-CM | POA: Diagnosis not present

## 2023-01-09 DIAGNOSIS — S3992XA Unspecified injury of lower back, initial encounter: Secondary | ICD-10-CM | POA: Diagnosis not present

## 2023-01-09 DIAGNOSIS — M48061 Spinal stenosis, lumbar region without neurogenic claudication: Secondary | ICD-10-CM | POA: Diagnosis not present

## 2023-01-09 DIAGNOSIS — M533 Sacrococcygeal disorders, not elsewhere classified: Secondary | ICD-10-CM | POA: Diagnosis not present

## 2023-01-09 DIAGNOSIS — M47817 Spondylosis without myelopathy or radiculopathy, lumbosacral region: Secondary | ICD-10-CM | POA: Diagnosis not present

## 2023-01-09 DIAGNOSIS — M4317 Spondylolisthesis, lumbosacral region: Secondary | ICD-10-CM | POA: Diagnosis not present

## 2023-01-09 DIAGNOSIS — S3992XS Unspecified injury of lower back, sequela: Secondary | ICD-10-CM | POA: Diagnosis not present

## 2023-01-09 DIAGNOSIS — M4807 Spinal stenosis, lumbosacral region: Secondary | ICD-10-CM | POA: Diagnosis not present

## 2023-01-09 DIAGNOSIS — M545 Low back pain, unspecified: Secondary | ICD-10-CM | POA: Diagnosis not present

## 2023-01-14 DIAGNOSIS — M47817 Spondylosis without myelopathy or radiculopathy, lumbosacral region: Secondary | ICD-10-CM | POA: Diagnosis not present

## 2023-01-14 DIAGNOSIS — M4807 Spinal stenosis, lumbosacral region: Secondary | ICD-10-CM | POA: Diagnosis not present

## 2023-01-14 DIAGNOSIS — S3992XS Unspecified injury of lower back, sequela: Secondary | ICD-10-CM | POA: Diagnosis not present

## 2023-01-19 DIAGNOSIS — D259 Leiomyoma of uterus, unspecified: Secondary | ICD-10-CM | POA: Diagnosis not present

## 2023-01-19 DIAGNOSIS — R1900 Intra-abdominal and pelvic swelling, mass and lump, unspecified site: Secondary | ICD-10-CM | POA: Diagnosis not present

## 2023-01-19 DIAGNOSIS — K5730 Diverticulosis of large intestine without perforation or abscess without bleeding: Secondary | ICD-10-CM | POA: Diagnosis not present

## 2023-01-21 DIAGNOSIS — I10 Essential (primary) hypertension: Secondary | ICD-10-CM | POA: Diagnosis not present

## 2023-01-21 DIAGNOSIS — E876 Hypokalemia: Secondary | ICD-10-CM | POA: Diagnosis not present

## 2023-01-26 ENCOUNTER — Telehealth: Payer: Self-pay

## 2023-01-26 NOTE — Telephone Encounter (Signed)
Fax received from [facility] indicating patient has DECLINED CPAP use. Fax states patient refuses to try machine in office and has signed a refusal of service waiver.   FYI - thank you

## 2023-01-26 NOTE — Telephone Encounter (Signed)
Please see if she would like to schedule a follow up visit to discuss in more detail.

## 2023-02-02 NOTE — Telephone Encounter (Signed)
I spoke with the patient. She did not like the mask they gave her and she did not want to pay another $180 for another mask. They gave her the dreamwear full face mask. She said she is willing to try the nasal pillows as long as she does not have to pay for it. I told her I would call [facility] and let her know what they day.  Their office is closed for the day. I will try again tomorrow.

## 2023-02-03 NOTE — Telephone Encounter (Signed)
Have her start using CPAP first.  If he sinus symptoms continue, then she could consider getting a humidifier.

## 2023-02-03 NOTE — Telephone Encounter (Signed)
Per Carollee Herter at [facility]- she was not the Respiratory Therapist that interacted with the patient, but she will reach out to her supervisor and see if they can get the patient another machine and try the nasal pillows mask. They will reach out to the patient. I have notified the patient that they will call her regarding the CPAP machine.  Dr. Craige Cotta- she wants to know if the CPAP will help with her sinuitis/nasal drainage or if she should invest in a humidifier?

## 2023-02-03 NOTE — Telephone Encounter (Signed)
I have notified the patient. Nothing further needed. 

## 2023-02-04 DIAGNOSIS — I10 Essential (primary) hypertension: Secondary | ICD-10-CM | POA: Diagnosis not present

## 2023-02-06 DIAGNOSIS — H401111 Primary open-angle glaucoma, right eye, mild stage: Secondary | ICD-10-CM | POA: Diagnosis not present

## 2023-02-06 DIAGNOSIS — H401122 Primary open-angle glaucoma, left eye, moderate stage: Secondary | ICD-10-CM | POA: Diagnosis not present

## 2023-02-16 ENCOUNTER — Telehealth: Payer: Self-pay | Admitting: Pulmonary Disease

## 2023-02-16 NOTE — Telephone Encounter (Signed)
Ext. 331, Pt pretty much has not been using machine, but says she can't use the machine bc she can't wear it. Pt was told to call provider before and they will turn down her pressure.

## 2023-02-18 DIAGNOSIS — I10 Essential (primary) hypertension: Secondary | ICD-10-CM | POA: Diagnosis not present

## 2023-02-19 ENCOUNTER — Ambulatory Visit (HOSPITAL_BASED_OUTPATIENT_CLINIC_OR_DEPARTMENT_OTHER): Payer: Medicare Other | Admitting: Cardiovascular Disease

## 2023-02-19 NOTE — Telephone Encounter (Signed)
We do not have a way to scan her compliance report into the system in [place] (Efraim Kaufmann is out). I have sent the report to you in an email.

## 2023-02-19 NOTE — Telephone Encounter (Signed)
Please get a copy of her CPAP download and have it scanned into Epic for my review.  Can adjust CPAP setting after reviewing this.

## 2023-02-20 NOTE — Telephone Encounter (Signed)
Noted  

## 2023-02-20 NOTE — Telephone Encounter (Signed)
Auto CPAP 11/18/22 to 02/15/23 >> used on 18 of 90 nights with average 4 hrs 57 min.  Average AHI 1.2 with median CPAP 7 and 95 th percentile CPAP 10 cm H2O   Please let her know I have sent an order to have her CPAP changed to 5 to 7 cm H2O.  If she is still having trouble then she would need schedule an ROV to discuss in more detail.

## 2023-02-20 NOTE — Telephone Encounter (Signed)
I spoke with the patient. She said she can not use the machine at all. She has tried multiple mask and she said she "just can not do it." She returned the machine on Saturday. This is the second time she has returned it. She does not want to try to use the CPAP again. She said thank you for trying to keep her healthy.

## 2023-03-09 ENCOUNTER — Telehealth (HOSPITAL_BASED_OUTPATIENT_CLINIC_OR_DEPARTMENT_OTHER): Payer: Self-pay | Admitting: Pulmonary Disease

## 2023-03-09 NOTE — Telephone Encounter (Signed)
Pt. Called wanting to see you Dr.Sood before you leave but you had nothing in your sched. But wanted to tell you thank you for everything you have done for her care and wished you well moving forward

## 2023-03-09 NOTE — Telephone Encounter (Signed)
Appreciate this.

## 2023-03-10 DIAGNOSIS — Z124 Encounter for screening for malignant neoplasm of cervix: Secondary | ICD-10-CM | POA: Diagnosis not present

## 2023-03-10 DIAGNOSIS — D259 Leiomyoma of uterus, unspecified: Secondary | ICD-10-CM | POA: Diagnosis not present

## 2023-03-13 DIAGNOSIS — E1122 Type 2 diabetes mellitus with diabetic chronic kidney disease: Secondary | ICD-10-CM | POA: Diagnosis not present

## 2023-03-13 DIAGNOSIS — E782 Mixed hyperlipidemia: Secondary | ICD-10-CM | POA: Diagnosis not present

## 2023-03-13 DIAGNOSIS — I10 Essential (primary) hypertension: Secondary | ICD-10-CM | POA: Diagnosis not present

## 2023-03-13 DIAGNOSIS — I4891 Unspecified atrial fibrillation: Secondary | ICD-10-CM | POA: Diagnosis not present

## 2023-03-13 DIAGNOSIS — J309 Allergic rhinitis, unspecified: Secondary | ICD-10-CM | POA: Diagnosis not present

## 2023-03-19 DIAGNOSIS — I10 Essential (primary) hypertension: Secondary | ICD-10-CM | POA: Diagnosis not present

## 2023-03-31 DIAGNOSIS — E1165 Type 2 diabetes mellitus with hyperglycemia: Secondary | ICD-10-CM | POA: Diagnosis not present

## 2023-03-31 DIAGNOSIS — I10 Essential (primary) hypertension: Secondary | ICD-10-CM | POA: Diagnosis not present

## 2023-03-31 DIAGNOSIS — E1122 Type 2 diabetes mellitus with diabetic chronic kidney disease: Secondary | ICD-10-CM | POA: Diagnosis not present

## 2023-03-31 DIAGNOSIS — E7849 Other hyperlipidemia: Secondary | ICD-10-CM | POA: Diagnosis not present

## 2023-03-31 DIAGNOSIS — E1142 Type 2 diabetes mellitus with diabetic polyneuropathy: Secondary | ICD-10-CM | POA: Diagnosis not present

## 2023-03-31 DIAGNOSIS — N189 Chronic kidney disease, unspecified: Secondary | ICD-10-CM | POA: Diagnosis not present

## 2023-03-31 DIAGNOSIS — E7801 Familial hypercholesterolemia: Secondary | ICD-10-CM | POA: Diagnosis not present

## 2023-04-03 ENCOUNTER — Other Ambulatory Visit (HOSPITAL_BASED_OUTPATIENT_CLINIC_OR_DEPARTMENT_OTHER): Payer: Self-pay | Admitting: Family

## 2023-04-03 DIAGNOSIS — I1A0 Resistant hypertension: Secondary | ICD-10-CM

## 2023-04-07 DIAGNOSIS — E876 Hypokalemia: Secondary | ICD-10-CM | POA: Diagnosis not present

## 2023-04-07 DIAGNOSIS — R944 Abnormal results of kidney function studies: Secondary | ICD-10-CM | POA: Diagnosis not present

## 2023-04-07 DIAGNOSIS — E1122 Type 2 diabetes mellitus with diabetic chronic kidney disease: Secondary | ICD-10-CM | POA: Diagnosis not present

## 2023-04-07 DIAGNOSIS — R2689 Other abnormalities of gait and mobility: Secondary | ICD-10-CM | POA: Diagnosis not present

## 2023-04-07 DIAGNOSIS — I10 Essential (primary) hypertension: Secondary | ICD-10-CM | POA: Diagnosis not present

## 2023-04-07 DIAGNOSIS — I272 Pulmonary hypertension, unspecified: Secondary | ICD-10-CM | POA: Diagnosis not present

## 2023-04-07 DIAGNOSIS — R911 Solitary pulmonary nodule: Secondary | ICD-10-CM | POA: Diagnosis not present

## 2023-04-07 DIAGNOSIS — E1142 Type 2 diabetes mellitus with diabetic polyneuropathy: Secondary | ICD-10-CM | POA: Diagnosis not present

## 2023-04-07 DIAGNOSIS — I739 Peripheral vascular disease, unspecified: Secondary | ICD-10-CM | POA: Diagnosis not present

## 2023-04-07 DIAGNOSIS — R748 Abnormal levels of other serum enzymes: Secondary | ICD-10-CM | POA: Diagnosis not present

## 2023-04-07 DIAGNOSIS — E1165 Type 2 diabetes mellitus with hyperglycemia: Secondary | ICD-10-CM | POA: Diagnosis not present

## 2023-04-09 ENCOUNTER — Ambulatory Visit (HOSPITAL_BASED_OUTPATIENT_CLINIC_OR_DEPARTMENT_OTHER): Payer: Medicare Other | Admitting: Family

## 2023-04-09 ENCOUNTER — Encounter (HOSPITAL_BASED_OUTPATIENT_CLINIC_OR_DEPARTMENT_OTHER): Payer: Self-pay | Admitting: Family

## 2023-04-09 VITALS — BP 161/104 | HR 94 | Ht 60.0 in | Wt 137.1 lb

## 2023-04-09 DIAGNOSIS — I4821 Permanent atrial fibrillation: Secondary | ICD-10-CM

## 2023-04-09 DIAGNOSIS — Z79899 Other long term (current) drug therapy: Secondary | ICD-10-CM | POA: Diagnosis not present

## 2023-04-09 DIAGNOSIS — G4733 Obstructive sleep apnea (adult) (pediatric): Secondary | ICD-10-CM

## 2023-04-09 DIAGNOSIS — I272 Pulmonary hypertension, unspecified: Secondary | ICD-10-CM

## 2023-04-09 DIAGNOSIS — I739 Peripheral vascular disease, unspecified: Secondary | ICD-10-CM

## 2023-04-09 DIAGNOSIS — I1A0 Resistant hypertension: Secondary | ICD-10-CM

## 2023-04-09 DIAGNOSIS — I2510 Atherosclerotic heart disease of native coronary artery without angina pectoris: Secondary | ICD-10-CM | POA: Diagnosis not present

## 2023-04-09 MED ORDER — VALSARTAN 320 MG PO TABS
320.0000 mg | ORAL_TABLET | Freq: Every day | ORAL | 3 refills | Status: DC
Start: 2023-04-09 — End: 2023-08-13

## 2023-04-09 MED ORDER — CHLORTHALIDONE 25 MG PO TABS
12.5000 mg | ORAL_TABLET | Freq: Every day | ORAL | 3 refills | Status: DC
Start: 2023-04-09 — End: 2023-08-13

## 2023-04-09 MED ORDER — DOXAZOSIN MESYLATE 4 MG PO TABS
4.0000 mg | ORAL_TABLET | Freq: Every evening | ORAL | 3 refills | Status: DC
Start: 2023-04-09 — End: 2023-11-12

## 2023-04-09 NOTE — Progress Notes (Signed)
Advanced Hypertension Clinic Assessment:    Date:  04/09/2023   ID:  TEOSHA ADI, DOB January 13, 1946, MRN [id number]  PCP:  Encarnacion Slates, PA-C  Cardiologist:  Dina Rich, MD  Nephrologist:  Referring MD: Encarnacion Slates, PA-C   CC: Hypertension  History of Present Illness:    Betty Goodman is a 77 y.o. female with a hx of atrial fibrillation, resistant hypertension, coronary artery calcification on CT, pulmonary hypertension, DM2, breast cancer, OSA, PAD here to follow up in the Advanced Hypertension Clinic.   She saw Sharlene Dory, NP in our [place] office and given persistent difficulties controlling hypertension was referred to Advanced Hypertension Clinic. Her Coreg CR was increased to 40mg  daily. Aldactone, Diltiazem, Losartan continued. Renal duplex 09/04/22 with no renal artery stenosis.   At initial Hypertension Clinic visit 10/09/22 it was noted Betty Goodman was diagnosed with hypertension in the late 1990s. It has been difficult to control. No tobacco nor etoh use.  No formal exercise routine but was following low-sodium, heart of the diet.  At that visit losartan was stopped and she was started on valsartan 320 mg daily.  Spironolactone was reduced to 25 mg daily and moved to morning dosing due to dry mouth.  Carotid duplex ordered with no carotid stenosis.  She had office visit with Sharlene Dory, NP 10/21/2022 with elevated BP 186/112 and repeat 162/83.  She had not yet taken her valsartan.  She was following with Dr. Darene Lamer at [facility] and had recent procedure, details unknown and records requested.   Sleep study with severe sleep apnea established with Dr. Craige Cotta and started on AutoPAP which she did not tolerate.   Last seen 11/27/22 with BP 158/102 in setting of self reducing Coreg from 40mg  to 20mg  due to dry mouth.  She reported home BP readings such as 139/97.  Discussed more likely related to spironolactone which was discontinued.  Carvedilol CR was returned to 40 mg.   She was started on doxazosin 2 mg every evening.  Presents today for follow up. She notes she has persistent "cotton mouth" despite discontinuation of Spironolactone. It is improved  but not resolved. Symptoms most bothersome a few hours after her medications.  She brings in pill bottles and is taking chlorthalidone 25 mg daily which was not previously on her medication list.  Does note stressors as she has a close friend with health issues who may have to be taken off of life support. Notes she cannot breath with CPAP even with nasal pillows. Reports no chest pain, pressure, or tightness. No edema, orthopnea, PND. Reports no palpitations.     Previous antihypertensives: Chlorthalidone - does not recall taking Clonidine - tolerated fine Lisinopril-HCTZ - switched to ARB Amlodipine - switched to Diltiazem for rate control Losartan-transition to valsartan Spironolactone - dry mouth  Past Medical History:  Diagnosis Date   Atrial fibrillation (HCC)    Breast cancer (HCC)    Stage IB (pT1, pN63micro, cM0) right breast cancer   Hypertension    Kidney stone    resulting in stenting   Type 2 diabetes mellitus (HCC)    Unspecified essential hypertension    Past Surgical History:  Procedure Laterality Date   ADENOIDECTOMY     COLONOSCOPY  08/17/2019   Dr. Aurea Graff; Mild nonbleeding diverticulosis in the left colon. Recommended consider repeat in 5-10 years given questionable maternal grandmother with colon cancer   TONSILLECTOMY     TUBAL LIGATION     Current Medications:  04/28/2022)   Received from [facility], [facility]   [facility] - Occupational Stress Questionnaire    Feeling of Stress : Not at all  Social Connections: Socially Isolated (04/28/2022)   Received from [facility], [facility]   Social Connection and Isolation Panel [NHANES]    Frequency of Communication with Friends and Family: More than three times a week    Frequency of Social Gatherings with Friends and Family: Once a week    Attends Religious Services: Never    Database administrator or Organizations: No    Attends Banker Meetings: Never    Marital Status: Widowed     Family History: The patient's family history includes Cancer in her mother; Leukemia in her sister. There is no history of Colon cancer.  ROS:   Please see the history of present illness.     All other systems reviewed and are negative.  EKGs/Labs/Other Studies Reviewed:         Cardiac Studies & Procedures       ECHOCARDIOGRAM  ECHOCARDIOGRAM COMPLETE 11/06/2020  Narrative ECHOCARDIOGRAM REPORT    Patient Name:   Betty Goodman Date of Exam: 11/06/2020 Medical Rec #:  [id number]      Height:       60.0 in Accession #:     [id number]     Weight:       151.4 lb Date of Birth:  Dec 01, 1945     BSA:          1.658 m Patient Age:    74 years       BP:           128/82 mmHg Patient Gender: F              HR:           95 bpm. Exam Location:  [place]  Procedure: 2D Echo, Cardiac Doppler and Color Doppler  Indications:    I27.20 Pulmonary hypertension  History:        Patient has prior history of Echocardiogram examinations, most recent 10/20/2019. Abnormal ECG, Pulmonary HTN and CKD, Arrythmias:Atrial Fibrillation, Signs/Symptoms:Shortness of Breath; Risk Factors:Hypertension, Diabetes, Dyslipidemia, Non-Smoker and Sleep Apnea.  Sonographer:    Jake Seats RDMS, RVT, RDCS Referring Phys: [id number] Netta Neat.  IMPRESSIONS   1. Left ventricular ejection fraction, by estimation, is 60 to 65%. The left ventricle has normal function. The left ventricle has no regional wall motion abnormalities. There is severe left ventricular hypertrophy. Left ventricular diastolic parameters are indeterminate. 2. Right ventricular systolic function is normal. The right ventricular size is normal. 3. Left atrial size was severely dilated. 4. Right atrial size was severely dilated. 5. The mitral valve is normal in structure. Trivial mitral valve regurgitation. No evidence of mitral stenosis. 6. The tricuspid valve is abnormal. Tricuspid valve regurgitation is moderate. 7. The aortic valve is tricuspid. There is mild calcification of the aortic valve. There is mild thickening of the aortic valve. Aortic valve regurgitation is not visualized. No aortic stenosis is present. 8. Mild pulmonary HTN, PASP is 32 mmHg. 9. The inferior vena cava is normal in size with greater than 50% respiratory variability, suggesting right atrial pressure of 3 mmHg.  Comparison(s): Echocardiogram done 10/20/19 showed an EF of 65-70%.  FINDINGS Left Ventricle: Left ventricular ejection fraction, by estimation, is 60 to 65%. The left ventricle has  normal function. The left ventricle has no regional wall  04/28/2022)   Received from [facility], [facility]   [facility] - Occupational Stress Questionnaire    Feeling of Stress : Not at all  Social Connections: Socially Isolated (04/28/2022)   Received from [facility], [facility]   Social Connection and Isolation Panel [NHANES]    Frequency of Communication with Friends and Family: More than three times a week    Frequency of Social Gatherings with Friends and Family: Once a week    Attends Religious Services: Never    Database administrator or Organizations: No    Attends Banker Meetings: Never    Marital Status: Widowed     Family History: The patient's family history includes Cancer in her mother; Leukemia in her sister. There is no history of Colon cancer.  ROS:   Please see the history of present illness.     All other systems reviewed and are negative.  EKGs/Labs/Other Studies Reviewed:         Cardiac Studies & Procedures       ECHOCARDIOGRAM  ECHOCARDIOGRAM COMPLETE 11/06/2020  Narrative ECHOCARDIOGRAM REPORT    Patient Name:   Betty Goodman Date of Exam: 11/06/2020 Medical Rec #:  [id number]      Height:       60.0 in Accession #:     [id number]     Weight:       151.4 lb Date of Birth:  Dec 01, 1945     BSA:          1.658 m Patient Age:    74 years       BP:           128/82 mmHg Patient Gender: F              HR:           95 bpm. Exam Location:  [place]  Procedure: 2D Echo, Cardiac Doppler and Color Doppler  Indications:    I27.20 Pulmonary hypertension  History:        Patient has prior history of Echocardiogram examinations, most recent 10/20/2019. Abnormal ECG, Pulmonary HTN and CKD, Arrythmias:Atrial Fibrillation, Signs/Symptoms:Shortness of Breath; Risk Factors:Hypertension, Diabetes, Dyslipidemia, Non-Smoker and Sleep Apnea.  Sonographer:    Jake Seats RDMS, RVT, RDCS Referring Phys: [id number] Netta Neat.  IMPRESSIONS   1. Left ventricular ejection fraction, by estimation, is 60 to 65%. The left ventricle has normal function. The left ventricle has no regional wall motion abnormalities. There is severe left ventricular hypertrophy. Left ventricular diastolic parameters are indeterminate. 2. Right ventricular systolic function is normal. The right ventricular size is normal. 3. Left atrial size was severely dilated. 4. Right atrial size was severely dilated. 5. The mitral valve is normal in structure. Trivial mitral valve regurgitation. No evidence of mitral stenosis. 6. The tricuspid valve is abnormal. Tricuspid valve regurgitation is moderate. 7. The aortic valve is tricuspid. There is mild calcification of the aortic valve. There is mild thickening of the aortic valve. Aortic valve regurgitation is not visualized. No aortic stenosis is present. 8. Mild pulmonary HTN, PASP is 32 mmHg. 9. The inferior vena cava is normal in size with greater than 50% respiratory variability, suggesting right atrial pressure of 3 mmHg.  Comparison(s): Echocardiogram done 10/20/19 showed an EF of 65-70%.  FINDINGS Left Ventricle: Left ventricular ejection fraction, by estimation, is 60 to 65%. The left ventricle has  normal function. The left ventricle has no regional wall  04/28/2022)   Received from [facility], [facility]   [facility] - Occupational Stress Questionnaire    Feeling of Stress : Not at all  Social Connections: Socially Isolated (04/28/2022)   Received from [facility], [facility]   Social Connection and Isolation Panel [NHANES]    Frequency of Communication with Friends and Family: More than three times a week    Frequency of Social Gatherings with Friends and Family: Once a week    Attends Religious Services: Never    Database administrator or Organizations: No    Attends Banker Meetings: Never    Marital Status: Widowed     Family History: The patient's family history includes Cancer in her mother; Leukemia in her sister. There is no history of Colon cancer.  ROS:   Please see the history of present illness.     All other systems reviewed and are negative.  EKGs/Labs/Other Studies Reviewed:         Cardiac Studies & Procedures       ECHOCARDIOGRAM  ECHOCARDIOGRAM COMPLETE 11/06/2020  Narrative ECHOCARDIOGRAM REPORT    Patient Name:   Betty Goodman Date of Exam: 11/06/2020 Medical Rec #:  [id number]      Height:       60.0 in Accession #:     [id number]     Weight:       151.4 lb Date of Birth:  Dec 01, 1945     BSA:          1.658 m Patient Age:    74 years       BP:           128/82 mmHg Patient Gender: F              HR:           95 bpm. Exam Location:  [place]  Procedure: 2D Echo, Cardiac Doppler and Color Doppler  Indications:    I27.20 Pulmonary hypertension  History:        Patient has prior history of Echocardiogram examinations, most recent 10/20/2019. Abnormal ECG, Pulmonary HTN and CKD, Arrythmias:Atrial Fibrillation, Signs/Symptoms:Shortness of Breath; Risk Factors:Hypertension, Diabetes, Dyslipidemia, Non-Smoker and Sleep Apnea.  Sonographer:    Jake Seats RDMS, RVT, RDCS Referring Phys: [id number] Netta Neat.  IMPRESSIONS   1. Left ventricular ejection fraction, by estimation, is 60 to 65%. The left ventricle has normal function. The left ventricle has no regional wall motion abnormalities. There is severe left ventricular hypertrophy. Left ventricular diastolic parameters are indeterminate. 2. Right ventricular systolic function is normal. The right ventricular size is normal. 3. Left atrial size was severely dilated. 4. Right atrial size was severely dilated. 5. The mitral valve is normal in structure. Trivial mitral valve regurgitation. No evidence of mitral stenosis. 6. The tricuspid valve is abnormal. Tricuspid valve regurgitation is moderate. 7. The aortic valve is tricuspid. There is mild calcification of the aortic valve. There is mild thickening of the aortic valve. Aortic valve regurgitation is not visualized. No aortic stenosis is present. 8. Mild pulmonary HTN, PASP is 32 mmHg. 9. The inferior vena cava is normal in size with greater than 50% respiratory variability, suggesting right atrial pressure of 3 mmHg.  Comparison(s): Echocardiogram done 10/20/19 showed an EF of 65-70%.  FINDINGS Left Ventricle: Left ventricular ejection fraction, by estimation, is 60 to 65%. The left ventricle has  normal function. The left ventricle has no regional wall  04/28/2022)   Received from [facility], [facility]   [facility] - Occupational Stress Questionnaire    Feeling of Stress : Not at all  Social Connections: Socially Isolated (04/28/2022)   Received from [facility], [facility]   Social Connection and Isolation Panel [NHANES]    Frequency of Communication with Friends and Family: More than three times a week    Frequency of Social Gatherings with Friends and Family: Once a week    Attends Religious Services: Never    Database administrator or Organizations: No    Attends Banker Meetings: Never    Marital Status: Widowed     Family History: The patient's family history includes Cancer in her mother; Leukemia in her sister. There is no history of Colon cancer.  ROS:   Please see the history of present illness.     All other systems reviewed and are negative.  EKGs/Labs/Other Studies Reviewed:         Cardiac Studies & Procedures       ECHOCARDIOGRAM  ECHOCARDIOGRAM COMPLETE 11/06/2020  Narrative ECHOCARDIOGRAM REPORT    Patient Name:   Betty Goodman Date of Exam: 11/06/2020 Medical Rec #:  [id number]      Height:       60.0 in Accession #:     [id number]     Weight:       151.4 lb Date of Birth:  Dec 01, 1945     BSA:          1.658 m Patient Age:    74 years       BP:           128/82 mmHg Patient Gender: F              HR:           95 bpm. Exam Location:  [place]  Procedure: 2D Echo, Cardiac Doppler and Color Doppler  Indications:    I27.20 Pulmonary hypertension  History:        Patient has prior history of Echocardiogram examinations, most recent 10/20/2019. Abnormal ECG, Pulmonary HTN and CKD, Arrythmias:Atrial Fibrillation, Signs/Symptoms:Shortness of Breath; Risk Factors:Hypertension, Diabetes, Dyslipidemia, Non-Smoker and Sleep Apnea.  Sonographer:    Jake Seats RDMS, RVT, RDCS Referring Phys: [id number] Netta Neat.  IMPRESSIONS   1. Left ventricular ejection fraction, by estimation, is 60 to 65%. The left ventricle has normal function. The left ventricle has no regional wall motion abnormalities. There is severe left ventricular hypertrophy. Left ventricular diastolic parameters are indeterminate. 2. Right ventricular systolic function is normal. The right ventricular size is normal. 3. Left atrial size was severely dilated. 4. Right atrial size was severely dilated. 5. The mitral valve is normal in structure. Trivial mitral valve regurgitation. No evidence of mitral stenosis. 6. The tricuspid valve is abnormal. Tricuspid valve regurgitation is moderate. 7. The aortic valve is tricuspid. There is mild calcification of the aortic valve. There is mild thickening of the aortic valve. Aortic valve regurgitation is not visualized. No aortic stenosis is present. 8. Mild pulmonary HTN, PASP is 32 mmHg. 9. The inferior vena cava is normal in size with greater than 50% respiratory variability, suggesting right atrial pressure of 3 mmHg.  Comparison(s): Echocardiogram done 10/20/19 showed an EF of 65-70%.  FINDINGS Left Ventricle: Left ventricular ejection fraction, by estimation, is 60 to 65%. The left ventricle has  normal function. The left ventricle has no regional wall  04/28/2022)   Received from [facility], [facility]   [facility] - Occupational Stress Questionnaire    Feeling of Stress : Not at all  Social Connections: Socially Isolated (04/28/2022)   Received from [facility], [facility]   Social Connection and Isolation Panel [NHANES]    Frequency of Communication with Friends and Family: More than three times a week    Frequency of Social Gatherings with Friends and Family: Once a week    Attends Religious Services: Never    Database administrator or Organizations: No    Attends Banker Meetings: Never    Marital Status: Widowed     Family History: The patient's family history includes Cancer in her mother; Leukemia in her sister. There is no history of Colon cancer.  ROS:   Please see the history of present illness.     All other systems reviewed and are negative.  EKGs/Labs/Other Studies Reviewed:         Cardiac Studies & Procedures       ECHOCARDIOGRAM  ECHOCARDIOGRAM COMPLETE 11/06/2020  Narrative ECHOCARDIOGRAM REPORT    Patient Name:   Betty Goodman Date of Exam: 11/06/2020 Medical Rec #:  [id number]      Height:       60.0 in Accession #:     [id number]     Weight:       151.4 lb Date of Birth:  Dec 01, 1945     BSA:          1.658 m Patient Age:    74 years       BP:           128/82 mmHg Patient Gender: F              HR:           95 bpm. Exam Location:  [place]  Procedure: 2D Echo, Cardiac Doppler and Color Doppler  Indications:    I27.20 Pulmonary hypertension  History:        Patient has prior history of Echocardiogram examinations, most recent 10/20/2019. Abnormal ECG, Pulmonary HTN and CKD, Arrythmias:Atrial Fibrillation, Signs/Symptoms:Shortness of Breath; Risk Factors:Hypertension, Diabetes, Dyslipidemia, Non-Smoker and Sleep Apnea.  Sonographer:    Jake Seats RDMS, RVT, RDCS Referring Phys: [id number] Netta Neat.  IMPRESSIONS   1. Left ventricular ejection fraction, by estimation, is 60 to 65%. The left ventricle has normal function. The left ventricle has no regional wall motion abnormalities. There is severe left ventricular hypertrophy. Left ventricular diastolic parameters are indeterminate. 2. Right ventricular systolic function is normal. The right ventricular size is normal. 3. Left atrial size was severely dilated. 4. Right atrial size was severely dilated. 5. The mitral valve is normal in structure. Trivial mitral valve regurgitation. No evidence of mitral stenosis. 6. The tricuspid valve is abnormal. Tricuspid valve regurgitation is moderate. 7. The aortic valve is tricuspid. There is mild calcification of the aortic valve. There is mild thickening of the aortic valve. Aortic valve regurgitation is not visualized. No aortic stenosis is present. 8. Mild pulmonary HTN, PASP is 32 mmHg. 9. The inferior vena cava is normal in size with greater than 50% respiratory variability, suggesting right atrial pressure of 3 mmHg.  Comparison(s): Echocardiogram done 10/20/19 showed an EF of 65-70%.  FINDINGS Left Ventricle: Left ventricular ejection fraction, by estimation, is 60 to 65%. The left ventricle has  normal function. The left ventricle has no regional wall

## 2023-04-09 NOTE — Patient Instructions (Addendum)
Medication Instructions:   REDUCE Chlorthalidone to 12.5mg  (half tablet) daily  INCREASE Doxazosin to 4mg  every evening Take two of your 2mg  tablets together until you run out We sent the stronger tablet into the [facility]     Follow-Up: As scheduled with Dr. Wyline Mood in November 2024 and with Alver Sorrow, NP in January 2025

## 2023-04-10 DIAGNOSIS — R918 Other nonspecific abnormal finding of lung field: Secondary | ICD-10-CM | POA: Diagnosis not present

## 2023-04-10 DIAGNOSIS — R911 Solitary pulmonary nodule: Secondary | ICD-10-CM | POA: Diagnosis not present

## 2023-04-10 DIAGNOSIS — I2723 Pulmonary hypertension due to lung diseases and hypoxia: Secondary | ICD-10-CM | POA: Diagnosis not present

## 2023-04-27 DIAGNOSIS — E785 Hyperlipidemia, unspecified: Secondary | ICD-10-CM | POA: Diagnosis not present

## 2023-04-27 DIAGNOSIS — Z7901 Long term (current) use of anticoagulants: Secondary | ICD-10-CM | POA: Diagnosis not present

## 2023-04-27 DIAGNOSIS — N189 Chronic kidney disease, unspecified: Secondary | ICD-10-CM | POA: Diagnosis not present

## 2023-04-27 DIAGNOSIS — M2560 Stiffness of unspecified joint, not elsewhere classified: Secondary | ICD-10-CM | POA: Diagnosis not present

## 2023-04-27 DIAGNOSIS — C50411 Malignant neoplasm of upper-outer quadrant of right female breast: Secondary | ICD-10-CM | POA: Diagnosis not present

## 2023-04-27 DIAGNOSIS — I890 Lymphedema, not elsewhere classified: Secondary | ICD-10-CM | POA: Diagnosis not present

## 2023-04-27 DIAGNOSIS — E1122 Type 2 diabetes mellitus with diabetic chronic kidney disease: Secondary | ICD-10-CM | POA: Diagnosis not present

## 2023-04-27 DIAGNOSIS — I129 Hypertensive chronic kidney disease with stage 1 through stage 4 chronic kidney disease, or unspecified chronic kidney disease: Secondary | ICD-10-CM | POA: Diagnosis not present

## 2023-04-27 DIAGNOSIS — Z170 Estrogen receptor positive status [ER+]: Secondary | ICD-10-CM | POA: Diagnosis not present

## 2023-04-27 DIAGNOSIS — Z79811 Long term (current) use of aromatase inhibitors: Secondary | ICD-10-CM | POA: Diagnosis not present

## 2023-04-27 DIAGNOSIS — Z7983 Long term (current) use of bisphosphonates: Secondary | ICD-10-CM | POA: Diagnosis not present

## 2023-04-27 DIAGNOSIS — L905 Scar conditions and fibrosis of skin: Secondary | ICD-10-CM | POA: Diagnosis not present

## 2023-04-27 DIAGNOSIS — Z882 Allergy status to sulfonamides status: Secondary | ICD-10-CM | POA: Diagnosis not present

## 2023-04-27 DIAGNOSIS — Z1231 Encounter for screening mammogram for malignant neoplasm of breast: Secondary | ICD-10-CM | POA: Diagnosis not present

## 2023-04-30 DIAGNOSIS — Z170 Estrogen receptor positive status [ER+]: Secondary | ICD-10-CM | POA: Diagnosis not present

## 2023-04-30 DIAGNOSIS — C50411 Malignant neoplasm of upper-outer quadrant of right female breast: Secondary | ICD-10-CM | POA: Diagnosis not present

## 2023-05-11 DIAGNOSIS — H25812 Combined forms of age-related cataract, left eye: Secondary | ICD-10-CM | POA: Diagnosis not present

## 2023-05-11 DIAGNOSIS — H401122 Primary open-angle glaucoma, left eye, moderate stage: Secondary | ICD-10-CM | POA: Diagnosis not present

## 2023-05-11 DIAGNOSIS — H401111 Primary open-angle glaucoma, right eye, mild stage: Secondary | ICD-10-CM | POA: Diagnosis not present

## 2023-05-11 DIAGNOSIS — E113313 Type 2 diabetes mellitus with moderate nonproliferative diabetic retinopathy with macular edema, bilateral: Secondary | ICD-10-CM | POA: Diagnosis not present

## 2023-05-14 DIAGNOSIS — E1122 Type 2 diabetes mellitus with diabetic chronic kidney disease: Secondary | ICD-10-CM | POA: Diagnosis not present

## 2023-05-14 DIAGNOSIS — I10 Essential (primary) hypertension: Secondary | ICD-10-CM | POA: Diagnosis not present

## 2023-05-14 DIAGNOSIS — E782 Mixed hyperlipidemia: Secondary | ICD-10-CM | POA: Diagnosis not present

## 2023-05-18 DIAGNOSIS — I739 Peripheral vascular disease, unspecified: Secondary | ICD-10-CM | POA: Diagnosis not present

## 2023-05-18 DIAGNOSIS — I70223 Atherosclerosis of native arteries of extremities with rest pain, bilateral legs: Secondary | ICD-10-CM | POA: Diagnosis not present

## 2023-05-22 DIAGNOSIS — R748 Abnormal levels of other serum enzymes: Secondary | ICD-10-CM | POA: Diagnosis not present

## 2023-05-22 DIAGNOSIS — R944 Abnormal results of kidney function studies: Secondary | ICD-10-CM | POA: Diagnosis not present

## 2023-05-22 DIAGNOSIS — I10 Essential (primary) hypertension: Secondary | ICD-10-CM | POA: Diagnosis not present

## 2023-06-02 ENCOUNTER — Encounter: Payer: Self-pay | Admitting: *Deleted

## 2023-06-02 ENCOUNTER — Encounter: Payer: Self-pay | Admitting: Cardiology

## 2023-06-02 ENCOUNTER — Ambulatory Visit: Payer: Medicare Other | Attending: Cardiology | Admitting: Cardiology

## 2023-06-02 VITALS — BP 136/84 | HR 58 | Ht 60.0 in | Wt 137.0 lb

## 2023-06-02 DIAGNOSIS — I272 Pulmonary hypertension, unspecified: Secondary | ICD-10-CM | POA: Insufficient documentation

## 2023-06-02 DIAGNOSIS — I1A0 Resistant hypertension: Secondary | ICD-10-CM | POA: Insufficient documentation

## 2023-06-02 DIAGNOSIS — D6869 Other thrombophilia: Secondary | ICD-10-CM | POA: Diagnosis not present

## 2023-06-02 DIAGNOSIS — I4891 Unspecified atrial fibrillation: Secondary | ICD-10-CM | POA: Diagnosis not present

## 2023-06-02 MED ORDER — CARVEDILOL PHOSPHATE ER 40 MG PO CP24: 40.0000 mg | ORAL_CAPSULE | Freq: Every day | ORAL | 1 refills | Status: DC

## 2023-06-02 NOTE — Progress Notes (Signed)
Clinical Summary Betty Goodman is a 77 y.o.female seen today for follow up of the following medical problems.    Afib - no recent palpitations - compliant with eliquis, no bleeding on eliquis.     2.Resistant HTN - followed in HTN clinic - severe OSA, intolerant to cpap 09/04/2022 with no renal artery stenosis.  10/01/2022 normal thyroid function   - seen in HTN clinic - dry mouth, aldactone was stopped but ongoing issues. Chlrothalidone then decreased to 12.5mg  daily.  - she is compliant with meds   Previous antihypertensives: Chlorthalidone - does not recall taking Clonidine - tolerated fine Lisinopril-HCTZ - switched to ARB Amlodipine - switched to Diltiazem for rate control Losartan-transition to valsartan Spironolactone - dry mouth     3.Breast cancer - followed by oncology    4.Pulmonary HTN - April 2021 echo reported PASP in the 80s. On review unclear of that calculation, highest in the 50s.  - 10/2020 echo LVEF 60-65%, indet diastolic, normal RV, severe BAE, mod TR, PASP is 32 - severe BAE would suggest significant diastolic dysfunction> she also has OSA - PFTs were unremarkable, negative serologies.   - some SOB but she related to sinus congestion.    5.OSA - has not wanted to wear cpap        6. PAD - reports prior intervention at [place], unclear details - 08/2023 had successful endovascular revasc of lower extremities.  - had nonhealing ulcers, these have now healed followeing procedure      Social history: She is originally from [place], [place].  She is widowed.  Her husband passed away in 2018/06/13.  She has an adult daughter, Jennette Kettle, who lives in the [place] area. She has another daughter in [place] in the [place]. area. She has 2 sons in [place]. She raised herself since the age of 2 initially in [place], [place].    Past Medical History:  Diagnosis Date   Atrial fibrillation (HCC)    Breast cancer (HCC)    Stage IB (pT1, pN17micro, cM0) right  breast cancer   Hypertension    Kidney stone    resulting in stenting   Type 2 diabetes mellitus (HCC)    Unspecified essential hypertension      Allergies  Allergen Reactions   Sulfa Antibiotics      Current Outpatient Medications  Medication Sig Dispense Refill   alendronate (FOSAMAX) 70 MG tablet Take 70 mg by mouth once a week. Take with a full glass of water on an empty stomach.     apixaban (ELIQUIS) 5 MG TABS tablet Take 1 tablet (5 mg total) by mouth 2 (two) times daily. 28 tablet 0   atorvastatin (LIPITOR) 40 MG tablet Take 40 mg by mouth at bedtime.     azelastine (ASTELIN) 0.1 % nasal spray Place 1 spray into both nostrils every morning. Use in each nostril as directed 30 mL 12   brimonidine-timolol (COMBIGAN) 0.2-0.5 % ophthalmic solution Place 1 drop into both eyes every 12 (twelve) hours.     carvedilol (COREG CR) 40 MG 24 hr capsule Take 1 capsule (40 mg total) by mouth daily. 90 capsule 1   chlorthalidone (HYGROTON) 25 MG tablet Take 0.5 tablets (12.5 mg total) by mouth daily. 45 tablet 3   Cholecalciferol (VITAMIN D) 50 MCG (2000 UT) CAPS Take 2 capsules by mouth 2 (two) times daily.     diltiazem (CARDIZEM CD) 360 MG 24 hr capsule Take 360 mg by mouth daily.  dorzolamide (TRUSOPT) 2 % ophthalmic solution Place 1 drop into the left eye 2 (two) times daily.     doxazosin (CARDURA) 4 MG tablet Take 1 tablet (4 mg total) by mouth every evening. 90 tablet 3   doxycycline (VIBRAMYCIN) 100 MG capsule Take 100 mg by mouth 2 (two) times daily. (Patient not taking: Reported on 04/09/2023)     empagliflozin (JARDIANCE) 25 MG TABS tablet Take 25 mg by mouth daily.     fluticasone (FLONASE) 50 MCG/ACT nasal spray Place 1 spray into both nostrils at bedtime.     glipiZIDE (GLUCOTROL) 10 MG tablet Take 10 mg by mouth daily before breakfast.     ketorolac (ACULAR) 0.5 % ophthalmic solution 1 drop 4 (four) times daily.     letrozole (FEMARA) 2.5 MG tablet Take 2.5 mg by mouth  daily.     Liraglutide (VICTOZA [place]) Inject 1.8 mg into the skin daily. (Patient not taking: Reported on 04/09/2023)     loratadine (CLARITIN) 10 MG tablet Take 10 mg by mouth daily.     metFORMIN (GLUCOPHAGE-XR) 500 MG 24 hr tablet Take 1,000 mg by mouth 2 (two) times daily.     nitroGLYCERIN (NITRODUR - DOSED IN MG/24 HR) 0.1 mg/hr patch Place 0.1 mg onto the skin daily. Apply to top of both feet (Patient not taking: Reported on 11/27/2022)     pantoprazole (PROTONIX) 40 MG tablet TAKE 1 TABLET BY MOUTH ONCE DAILY BEFORE BREAKFAST 30 tablet 0   SANTYL 250 UNIT/GM ointment Apply 1 Application topically daily. (Patient not taking: Reported on 04/09/2023)     valsartan (DIOVAN) 320 MG tablet Take 1 tablet (320 mg total) by mouth daily. 90 tablet 3   No current facility-administered medications for this visit.     Past Surgical History:  Procedure Laterality Date   ADENOIDECTOMY     COLONOSCOPY  08/17/2019   Dr. Aurea Graff; Mild nonbleeding diverticulosis in the left colon. Recommended consider repeat in 5-10 years given questionable maternal grandmother with colon cancer   TONSILLECTOMY     TUBAL LIGATION       Allergies  Allergen Reactions   Sulfa Antibiotics       Family History  Problem Relation Age of Onset   Cancer Mother        Liver   Leukemia Sister    Colon cancer Neg Hx      Social History Betty Goodman reports that she has never smoked. She has never been exposed to tobacco smoke. She has never used smokeless tobacco. Betty Goodman reports no history of alcohol use.   Review of Systems CONSTITUTIONAL: No weight loss, fever, chills, weakness or fatigue.  HEENT: Eyes: No visual loss, blurred vision, double vision or yellow sclerae.No hearing loss, sneezing, congestion, runny nose or sore throat.  SKIN: No rash or itching.  CARDIOVASCULAR: per hpi RESPIRATORY: No shortness of breath, cough or sputum.  GASTROINTESTINAL: No anorexia, nausea, vomiting or diarrhea. No  abdominal pain or blood.  GENITOURINARY: No burning on urination, no polyuria NEUROLOGICAL: No headache, dizziness, syncope, paralysis, ataxia, numbness or tingling in the extremities. No change in bowel or bladder control.  MUSCULOSKELETAL: No muscle, back pain, joint pain or stiffness.  LYMPHATICS: No enlarged nodes. No history of splenectomy.  PSYCHIATRIC: No history of depression or anxiety.  ENDOCRINOLOGIC: No reports of sweating, cold or heat intolerance. No polyuria or polydipsia.  Marland Kitchen   Physical Examination Today's Vitals   06/02/23 1317  BP: 136/84  Pulse: (!) 58  SpO2:  92%  Weight: 137 lb (62.1 kg)  Height: 5' (1.524 m)   Body mass index is 26.76 kg/m.  Gen: resting comfortably, no acute distress HEENT: no scleral icterus, pupils equal round and reactive, no palptable cervical adenopathy,  CV: irreg, no m/rg, no jvd Resp: Clear to auscultation bilaterally GI: abdomen is soft, non-tender, non-distended, normal bowel sounds, no hepatosplenomegaly MSK: extremities are warm, no edema.  Skin: warm, no rash Neuro:  no focal deficits Psych: appropriate affect   Diagnostic Studies 10/2020 echo IMPRESSIONS     1. Left ventricular ejection fraction, by estimation, is 60 to 65%. The  left ventricle has normal function. The left ventricle has no regional  wall motion abnormalities. There is severe left ventricular hypertrophy.  Left ventricular diastolic parameters   are indeterminate.   2. Right ventricular systolic function is normal. The right ventricular  size is normal.   3. Left atrial size was severely dilated.   4. Right atrial size was severely dilated.   5. The mitral valve is normal in structure. Trivial mitral valve  regurgitation. No evidence of mitral stenosis.   6. The tricuspid valve is abnormal. Tricuspid valve regurgitation is  moderate.   7. The aortic valve is tricuspid. There is mild calcification of the  aortic valve. There is mild thickening of  the aortic valve. Aortic valve  regurgitation is not visualized. No aortic stenosis is present.   8. Mild pulmonary HTN, PASP is 32 mmHg.   9. The inferior vena cava is normal in size with greater than 50%  respiratory variability, suggesting right atrial pressure of 3 mmHg.      Assessment and Plan  Afib/acquired thrombophilia -no symptoms, continue current meds   2. Resistant HTN -at goal, followed by HTN clinic - continue current meds   3. Pulmonary HTN - reviwed echos as reported above, most recent study mild pulm HTN likely secondary to diastolic dysfunction and OSA. No strong indication for more involved testing  -continue to monitor at this time         Antoine Poche, M.D.

## 2023-06-02 NOTE — Patient Instructions (Signed)
Medication Instructions:   Coreg refilled today.   Continue all other medications.    Labwork: none  Testing/Procedures: none  Follow-Up: 6 months   Any Other Special Instructions Will Be Listed Below (If Applicable).  If you need a refill on your cardiac medications before your next appointment, please call your pharmacy.

## 2023-06-03 ENCOUNTER — Encounter: Payer: Self-pay | Admitting: General Practice

## 2023-06-17 ENCOUNTER — Encounter: Payer: Self-pay | Admitting: Internal Medicine

## 2023-06-19 DIAGNOSIS — E7801 Familial hypercholesterolemia: Secondary | ICD-10-CM | POA: Diagnosis not present

## 2023-06-19 DIAGNOSIS — E7849 Other hyperlipidemia: Secondary | ICD-10-CM | POA: Diagnosis not present

## 2023-06-19 DIAGNOSIS — E1165 Type 2 diabetes mellitus with hyperglycemia: Secondary | ICD-10-CM | POA: Diagnosis not present

## 2023-06-19 DIAGNOSIS — N189 Chronic kidney disease, unspecified: Secondary | ICD-10-CM | POA: Diagnosis not present

## 2023-06-19 DIAGNOSIS — Z6836 Body mass index (BMI) 36.0-36.9, adult: Secondary | ICD-10-CM | POA: Diagnosis not present

## 2023-06-19 DIAGNOSIS — E782 Mixed hyperlipidemia: Secondary | ICD-10-CM | POA: Diagnosis not present

## 2023-06-19 DIAGNOSIS — E1122 Type 2 diabetes mellitus with diabetic chronic kidney disease: Secondary | ICD-10-CM | POA: Diagnosis not present

## 2023-06-19 DIAGNOSIS — E7800 Pure hypercholesterolemia, unspecified: Secondary | ICD-10-CM | POA: Diagnosis not present

## 2023-06-19 DIAGNOSIS — E1142 Type 2 diabetes mellitus with diabetic polyneuropathy: Secondary | ICD-10-CM | POA: Diagnosis not present

## 2023-06-24 DIAGNOSIS — R748 Abnormal levels of other serum enzymes: Secondary | ICD-10-CM | POA: Diagnosis not present

## 2023-06-24 DIAGNOSIS — E1122 Type 2 diabetes mellitus with diabetic chronic kidney disease: Secondary | ICD-10-CM | POA: Diagnosis not present

## 2023-06-24 DIAGNOSIS — E876 Hypokalemia: Secondary | ICD-10-CM | POA: Diagnosis not present

## 2023-06-24 DIAGNOSIS — I739 Peripheral vascular disease, unspecified: Secondary | ICD-10-CM | POA: Diagnosis not present

## 2023-06-24 DIAGNOSIS — I272 Pulmonary hypertension, unspecified: Secondary | ICD-10-CM | POA: Diagnosis not present

## 2023-06-24 DIAGNOSIS — R911 Solitary pulmonary nodule: Secondary | ICD-10-CM | POA: Diagnosis not present

## 2023-06-24 DIAGNOSIS — R944 Abnormal results of kidney function studies: Secondary | ICD-10-CM | POA: Diagnosis not present

## 2023-06-24 DIAGNOSIS — J0100 Acute maxillary sinusitis, unspecified: Secondary | ICD-10-CM | POA: Diagnosis not present

## 2023-06-24 DIAGNOSIS — I10 Essential (primary) hypertension: Secondary | ICD-10-CM | POA: Diagnosis not present

## 2023-06-24 DIAGNOSIS — E1165 Type 2 diabetes mellitus with hyperglycemia: Secondary | ICD-10-CM | POA: Diagnosis not present

## 2023-06-24 DIAGNOSIS — C50911 Malignant neoplasm of unspecified site of right female breast: Secondary | ICD-10-CM | POA: Diagnosis not present

## 2023-06-24 DIAGNOSIS — E1142 Type 2 diabetes mellitus with diabetic polyneuropathy: Secondary | ICD-10-CM | POA: Diagnosis not present

## 2023-07-14 DIAGNOSIS — N189 Chronic kidney disease, unspecified: Secondary | ICD-10-CM | POA: Diagnosis not present

## 2023-07-14 DIAGNOSIS — I10 Essential (primary) hypertension: Secondary | ICD-10-CM | POA: Diagnosis not present

## 2023-07-14 DIAGNOSIS — E1122 Type 2 diabetes mellitus with diabetic chronic kidney disease: Secondary | ICD-10-CM | POA: Diagnosis not present

## 2023-07-14 DIAGNOSIS — E782 Mixed hyperlipidemia: Secondary | ICD-10-CM | POA: Diagnosis not present

## 2023-07-17 ENCOUNTER — Ambulatory Visit (INDEPENDENT_AMBULATORY_CARE_PROVIDER_SITE_OTHER): Payer: Medicare Other | Admitting: Internal Medicine

## 2023-07-17 ENCOUNTER — Encounter: Payer: Self-pay | Admitting: Internal Medicine

## 2023-07-17 DIAGNOSIS — K219 Gastro-esophageal reflux disease without esophagitis: Secondary | ICD-10-CM

## 2023-07-17 MED ORDER — PANTOPRAZOLE SODIUM 40 MG PO TBEC: 40.0000 mg | DELAYED_RELEASE_TABLET | Freq: Every day | ORAL | 3 refills | Status: AC

## 2023-07-17 NOTE — Progress Notes (Signed)
 Primary Care Physician:  Jolee Greig VEAR DEVONNA Primary Gastroenterologist:  Dr. Shaaron  Pre-Procedure History & Physical: HPI:  Betty Goodman is a 78 y.o. female here for follow-up of GERD.  On Protonix  40 mg once daily  - doing well.  No dysphagia.  History of diverticulosis on colonoscopy done elsewhere in 2021.  Over the holidays she ate very well.  Pinto beans, chili beans for [employer] day.  Ate a lot of sausage.  Blood pressure up;  increase flatulence.  Went to [facility] and got Beano.  That is helped tremendously.  She has not had any melena or rectal bleeding  Past Medical History:  Diagnosis Date   Atrial fibrillation (HCC)    Breast cancer (HCC)    Stage IB (pT1, pN50micro, cM0) right breast cancer   Hypertension    Kidney stone    resulting in stenting   Type 2 diabetes mellitus (HCC)    Unspecified essential hypertension     Past Surgical History:  Procedure Laterality Date   ADENOIDECTOMY     COLONOSCOPY  08/17/2019   Dr. Celia; Mild nonbleeding diverticulosis in the left colon. Recommended consider repeat in 5-10 years given questionable maternal grandmother with colon cancer   TONSILLECTOMY     TUBAL LIGATION      Prior to Admission medications   Medication Sig Start Date End Date Taking? Authorizing Provider  alendronate (FOSAMAX) 70 MG tablet Take 70 mg by mouth once a week. Take with a full glass of water on an empty stomach.   Yes [provider]  apixaban  (ELIQUIS ) 5 MG TABS tablet Take 1 tablet (5 mg total) by mouth 2 (two) times daily. 08/03/18  Yes Koneswaran, Suresh A, MD  atorvastatin (LIPITOR) 40 MG tablet Take 40 mg by mouth at bedtime.   Yes [provider]  azelastine  (ASTELIN ) 0.1 % nasal spray Place 1 spray into both nostrils every morning. Use in each nostril as directed 10/13/22  Yes Sood, Vineet, MD  brimonidine-timolol (COMBIGAN) 0.2-0.5 % ophthalmic solution Place 1 drop into both eyes every 12 (twelve) hours.   Yes  [provider]  carvedilol  (COREG  CR) 40 MG 24 hr capsule Take 1 capsule (40 mg total) by mouth daily. 06/02/23  Yes BranchDorn FALCON, MD  chlorthalidone  (HYGROTON ) 25 MG tablet Take 0.5 tablets (12.5 mg total) by mouth daily. 04/09/23 07/17/23 Yes Vannie Reche RAMAN, NP  Cholecalciferol (VITAMIN D) 50 MCG (2000 UT) CAPS Take 2 capsules by mouth 2 (two) times daily.   Yes [provider]  dorzolamide (TRUSOPT) 2 % ophthalmic solution Place 1 drop into the left eye 2 (two) times daily. 07/18/22  Yes [provider]  doxazosin  (CARDURA ) 4 MG tablet Take 1 tablet (4 mg total) by mouth every evening. 04/09/23  Yes Walker, Caitlin S, NP  empagliflozin (JARDIANCE) 25 MG TABS tablet Take 25 mg by mouth daily.   Yes [provider]  fluticasone (FLONASE) 50 MCG/ACT nasal spray Place 1 spray into both nostrils at bedtime.   Yes [provider]  glipiZIDE (GLUCOTROL) 10 MG tablet Take 10 mg by mouth daily before breakfast.   Yes [provider]  ketorolac  (ACULAR ) 0.5 % ophthalmic solution 1 drop 4 (four) times daily. 06/13/22  Yes [provider]  letrozole (FEMARA) 2.5 MG tablet Take 2.5 mg by mouth daily. 08/08/20  Yes [provider]  Liraglutide (VICTOZA [place]) Inject 1.8 mg into the skin daily.   Yes [provider]  loratadine (CLARITIN) 10 MG tablet Take 10 mg by mouth daily.   Yes [provider]  metFORMIN (GLUCOPHAGE-XR) 500 MG 24 hr tablet Take 1,000 mg by mouth 2 (two) times daily. 04/02/22  Yes [provider]  pantoprazole  (PROTONIX ) 40 MG tablet TAKE 1 TABLET BY MOUTH ONCE DAILY BEFORE BREAKFAST 10/14/22  Yes Kaylianna Detert, Lamar HERO, MD  ROCKLATAN 0.02-0.005 % SOLN SMARTSIG:1 Drop(s) In Eye(s) Every Evening 05/11/23  Yes [provider]  valsartan  (DIOVAN ) 320 MG tablet Take 1 tablet (320 mg total) by mouth daily. 04/09/23  Yes Vannie Reche RAMAN, NP  diltiazem  (CARDIZEM  CD) 360 MG 24 hr capsule Take 360 mg  by mouth daily.    [provider]    Allergies as of 07/17/2023 - Review Complete 07/17/2023  Allergen Reaction Noted   Sulfa antibiotics  12/26/2011    Family History  Problem Relation Age of Onset   Cancer Mother        Liver   Leukemia Sister    Colon cancer Neg Hx     Social History   Socioeconomic History   Marital status: Widowed    Spouse name: Not on file   Number of children: 4   Years of education: Not on file   Highest education level: Not on file  Occupational History   Occupation: Retired  Tobacco Use   Smoking status: Never    Passive exposure: Never   Smokeless tobacco: Never  Vaping Use   Vaping status: Never Used  Substance and Sexual Activity   Alcohol  use: No   Drug use: Not on file   Sexual activity: Not on file  Other Topics Concern   Not on file  Social History Narrative   Lives with husband one daughter and two grandchildren.   Social Drivers of Corporate Investment Banker Strain: Low Risk  (10/09/2022)   Overall Financial Resource Strain (CARDIA)    Difficulty of Paying Living Expenses: Not very hard  Food Insecurity: No Food Insecurity (10/09/2022)   Hunger Vital Sign    Worried About Running Out of Food in the Last Year: Never true    Ran Out of Food in the Last Year: Never true  Transportation Needs: No Transportation Needs (10/09/2022)   PRAPARE - Administrator, Civil Service (Medical): No    Lack of Transportation (Non-Medical): No  Physical Activity: Inactive (10/09/2022)   Exercise Vital Sign    Days of Exercise per Week: 0 days    Minutes of Exercise per Session: 0 min  Stress: No Stress Concern Present (04/28/2022)   Received from [facility], [facility] of Occupational [facility] - Occupational Stress Questionnaire    Feeling of Stress : Not at all  Social Connections: Socially Isolated (04/28/2022)   Received from [facility], [facility]   Social Connection and  Isolation Panel [NHANES]    Frequency of Communication with Friends and Family: More than three times a week    Frequency of Social Gatherings with Friends and Family: Once a week    Attends Religious Services: Never    Database Administrator or Organizations: No    Attends Banker Meetings: Never    Marital Status: Widowed  Intimate Partner Violence: Not At Risk (03/10/2023)   Received from [facility]   Humiliation, Afraid, Rape, and Kick questionnaire    Fear of Current or Ex-Partner: No    Emotionally Abused:  No    Physically Abused: No    Sexually Abused: No    Review of Systems: See HPI, otherwise negative ROS  Physical Exam: BP (!) 180/108 (BP Location: Left Arm, Patient Position: Sitting, Cuff Size: Normal)   Pulse (!) 101   Temp 97.8 F (36.6 C) (Oral)   Ht 5' (1.524 m)   Wt 142 lb (64.4 kg)   SpO2 96%   BMI 27.73 kg/m  General:   Alert,  Well-developed, well-nourished, pleasant and cooperative in NAD Neck:  Supple; no masses or thyromegaly. No significant cervical adenopathy. Lungs:  Clear throughout to auscultation.   No wheezes, crackles, or rhonchi. No acute distress. Heart:  Regular rate and rhythm; no murmurs, clicks, rubs,  or gallops. Abdomen: Non-distended, normal bowel sounds.  Soft and nontender without appreciable mass or hepatosplenomegaly.    Impression/Plan: Pleasant 78 year old lady with well-controlled GERD no alarm symptoms.  Recent gas bloat in the setting of dietary indiscretion over the holidays.  Likely, lactose intolerant.  Simethicone has helped with her bloating considerably. Strategies to minimize lactose exposure.  Blood pressure elevated today.   Recommendations:  Continue Protonix  or pantoprazole  40 mg daily best taken 30 minutes before a meal (dispense 90 with 3 additional refills through the [facility])  Use Beano as needed when consuming gas producing foods  For lactose intolerance, try Fair Life milk products at the  grocery store.  The milk is highly filtered and the lactose is removed.  It takes good!  Have blood pressure rechecked next week.  Unless something comes up, we will plan patient back in 1 year.    Notice: This dictation was prepared with Dragon dictation along with smaller phrase technology. Any transcriptional errors that result from this process are unintentional and may not be corrected upon review.

## 2023-07-17 NOTE — Patient Instructions (Signed)
 Happy new year!  It was good to see you again today!  Continue Protonix  or pantoprazole  40 mg daily best taken 30 minutes before a meal (dispense 90 with 3 additional refills through the [facility])  Use Beano as needed when consuming gas producing foods  For lactose intolerance, try [employer] Life milk products at the grocery store.  The milk is highly filtered and the lactose is removed.  It takes good!  Have blood pressure rechecked next week.  Unless something comes up, we will plan to see you back in 1 year.

## 2023-07-23 ENCOUNTER — Encounter (HOSPITAL_BASED_OUTPATIENT_CLINIC_OR_DEPARTMENT_OTHER): Payer: Medicare Other | Admitting: Family

## 2023-08-05 DIAGNOSIS — N390 Urinary tract infection, site not specified: Secondary | ICD-10-CM | POA: Diagnosis not present

## 2023-08-05 DIAGNOSIS — I4891 Unspecified atrial fibrillation: Secondary | ICD-10-CM | POA: Diagnosis not present

## 2023-08-05 DIAGNOSIS — N1832 Chronic kidney disease, stage 3b: Secondary | ICD-10-CM | POA: Diagnosis not present

## 2023-08-05 DIAGNOSIS — I129 Hypertensive chronic kidney disease with stage 1 through stage 4 chronic kidney disease, or unspecified chronic kidney disease: Secondary | ICD-10-CM | POA: Diagnosis not present

## 2023-08-05 DIAGNOSIS — E1122 Type 2 diabetes mellitus with diabetic chronic kidney disease: Secondary | ICD-10-CM | POA: Diagnosis not present

## 2023-08-06 ENCOUNTER — Encounter (HOSPITAL_BASED_OUTPATIENT_CLINIC_OR_DEPARTMENT_OTHER): Payer: Medicare Other | Admitting: Family

## 2023-08-07 ENCOUNTER — Other Ambulatory Visit (HOSPITAL_COMMUNITY): Payer: Self-pay | Admitting: Nephrology

## 2023-08-07 DIAGNOSIS — N1832 Chronic kidney disease, stage 3b: Secondary | ICD-10-CM

## 2023-08-11 ENCOUNTER — Ambulatory Visit (HOSPITAL_COMMUNITY)
Admission: RE | Admit: 2023-08-11 | Discharge: 2023-08-11 | Disposition: A | Discharge status: Home / Self Care | Payer: Medicare Other | Source: Ambulatory Visit | Attending: Nephrology | Admitting: Nephrology

## 2023-08-11 DIAGNOSIS — N1832 Chronic kidney disease, stage 3b: Secondary | ICD-10-CM | POA: Diagnosis not present

## 2023-08-11 DIAGNOSIS — N189 Chronic kidney disease, unspecified: Secondary | ICD-10-CM | POA: Diagnosis not present

## 2023-08-13 ENCOUNTER — Ambulatory Visit (HOSPITAL_BASED_OUTPATIENT_CLINIC_OR_DEPARTMENT_OTHER): Payer: Medicare Other | Admitting: Family

## 2023-08-13 ENCOUNTER — Encounter (HOSPITAL_BASED_OUTPATIENT_CLINIC_OR_DEPARTMENT_OTHER): Payer: Self-pay | Admitting: Family

## 2023-08-13 VITALS — BP 127/76 | HR 54 | Ht 60.0 in | Wt 139.8 lb

## 2023-08-13 DIAGNOSIS — G4733 Obstructive sleep apnea (adult) (pediatric): Secondary | ICD-10-CM | POA: Diagnosis not present

## 2023-08-13 DIAGNOSIS — I10 Essential (primary) hypertension: Secondary | ICD-10-CM

## 2023-08-13 DIAGNOSIS — I4821 Permanent atrial fibrillation: Secondary | ICD-10-CM

## 2023-08-13 DIAGNOSIS — D6859 Other primary thrombophilia: Secondary | ICD-10-CM

## 2023-08-13 DIAGNOSIS — Z79899 Other long term (current) drug therapy: Secondary | ICD-10-CM

## 2023-08-13 DIAGNOSIS — I272 Pulmonary hypertension, unspecified: Secondary | ICD-10-CM | POA: Diagnosis not present

## 2023-08-13 DIAGNOSIS — I1A0 Resistant hypertension: Secondary | ICD-10-CM | POA: Diagnosis not present

## 2023-08-13 DIAGNOSIS — I4819 Other persistent atrial fibrillation: Secondary | ICD-10-CM

## 2023-08-13 DIAGNOSIS — I739 Peripheral vascular disease, unspecified: Secondary | ICD-10-CM | POA: Diagnosis not present

## 2023-08-13 MED ORDER — VALSARTAN 320 MG PO TABS: 320.0000 mg | ORAL_TABLET | Freq: Every day | ORAL | 3 refills | Status: AC

## 2023-08-13 NOTE — Progress Notes (Signed)
Advanced Hypertension Clinic Assessment:    Date:  08/13/2023   ID:  Betty Goodman, DOB 1945/10/29, MRN [id number]  PCP:  Encarnacion Slates, PA-C  Cardiologist:  Dina Rich, MD  Nephrologist:  Referring MD: Encarnacion Slates, PA-C   CC: Hypertension  History of Present Illness:    Betty Goodman is a 78 y.o. female with a hx of atrial fibrillation, resistant hypertension, coronary artery calcification on CT, pulmonary hypertension, DM2, breast cancer, OSA, PAD here to follow up in the Advanced Hypertension Clinic.   She saw Sharlene Dory, NP in our [place] office and given persistent difficulties controlling hypertension was referred to Advanced Hypertension Clinic. Her Coreg CR was increased to 40mg  daily. Aldactone, Diltiazem, Losartan continued. Renal duplex 09/04/22 with no renal artery stenosis.   At initial Hypertension Clinic visit 10/09/22 it was noted Betty Goodman was diagnosed with hypertension in the late 1990s. It has been difficult to control. No tobacco nor etoh use.  No formal exercise routine but was following low-sodium, heart of the diet.  At that visit losartan was stopped and she was started on valsartan 320 mg daily.  Spironolactone was reduced to 25 mg daily and moved to morning dosing due to dry mouth.  Carotid duplex ordered with no carotid stenosis.  She had office visit with Sharlene Dory, NP 10/21/2022 with elevated BP 186/112 and repeat 162/83.  She had not yet taken her valsartan.  She was following with Dr. Darene Lamer at [facility] and had recent procedure, details unknown and records requested.   Sleep study with severe sleep apnea established with Dr. Craige Cotta and started on AutoPAP which she did not tolerate and subsequently returned.   Seen 11/27/22 with BP 158/102 in setting of self reducing Coreg from 40mg  to 20mg  due to dry mouth.  Discussed more likely related to spironolactone which was discontinued.  Carvedilol CR was returned to 40 mg.  She was started on  doxazosin 2 mg every evening.  At visit 04/09/23 Chlorthalidone reduce due to dry mouth. Doxazosin increased from 2mg  to 4mg .   Presents today for follow up. Still complains of feeling dry. Discussed unlikely related to Doxazosin. She is having trouble splitting her Chlorthalidone in half as the pills crumble. BP this morning prior to medications 165. Otherwise BP 140-150/70-80. She has had low sodium diet. Isolated episode of dyspnea which did not recur. Feels she cannot walk as far as she used to be able to. No chest pain, palpitations, lightheadedness, dizziness. Since last seen had ultrasound of her kidneys with her nephrologist.Sees nephrology again in May. She did not tolerate CPAP and has returned, she is not interested in trying again.   Previous antihypertensives: Chlorthalidone - dry  mouth Clonidine - tolerated fine Lisinopril-HCTZ - switched to ARB Amlodipine - switched to Diltiazem for rate control Losartan-transition to valsartan Spironolactone - dry mouth  Past Medical History:  Diagnosis Date   Atrial fibrillation (HCC)    Breast cancer (HCC)    Stage IB (pT1, pN20micro, cM0) right breast cancer   Hypertension    Kidney stone    resulting in stenting   Type 2 diabetes mellitus (HCC)    Unspecified essential hypertension    Past Surgical History:  Procedure Laterality Date   ADENOIDECTOMY     COLONOSCOPY  08/17/2019   Dr. Aurea Graff; Mild nonbleeding diverticulosis in the left colon. Recommended consider repeat in 5-10 years given questionable maternal grandmother with colon cancer   TONSILLECTOMY  TUBAL LIGATION     Current Medications: Current Meds  Medication Sig   alendronate (FOSAMAX) 70 MG tablet Take 70 mg by mouth once a week. Take with a full glass of water on an empty stomach.   apixaban (ELIQUIS) 5 MG TABS tablet Take 1 tablet (5 mg total) by mouth 2 (two) times daily.   atorvastatin (LIPITOR) 40 MG tablet Take 40 mg by mouth at bedtime.   azelastine  (ASTELIN) 0.1 % nasal spray Place 1 spray into both nostrils every morning. Use in each nostril as directed   brimonidine-timolol (COMBIGAN) 0.2-0.5 % ophthalmic solution Place 1 drop into both eyes every 12 (twelve) hours.   carvedilol (COREG CR) 40 MG 24 hr capsule Take 1 capsule (40 mg total) by mouth daily.   Cholecalciferol (VITAMIN D) 50 MCG (2000 UT) CAPS Take 2 capsules by mouth 2 (two) times daily.   diltiazem (CARDIZEM CD) 360 MG 24 hr capsule Take 360 mg by mouth daily.   dorzolamide (TRUSOPT) 2 % ophthalmic solution Place 1 drop into the left eye 2 (two) times daily.   doxazosin (CARDURA) 4 MG tablet Take 1 tablet (4 mg total) by mouth every evening.   empagliflozin (JARDIANCE) 25 MG TABS tablet Take 25 mg by mouth daily.   fluticasone (FLONASE) 50 MCG/ACT nasal spray Place 1 spray into both nostrils at bedtime.   glipiZIDE (GLUCOTROL) 10 MG tablet Take 10 mg by mouth daily before breakfast.   ketorolac (ACULAR) 0.5 % ophthalmic solution 1 drop 4 (four) times daily.   letrozole (FEMARA) 2.5 MG tablet Take 2.5 mg by mouth daily.   Liraglutide (VICTOZA [place]) Inject 1.8 mg into the skin daily.   loratadine (CLARITIN) 10 MG tablet Take 10 mg by mouth daily.   metFORMIN (GLUCOPHAGE-XR) 500 MG 24 hr tablet Take 1,000 mg by mouth 2 (two) times daily.   pantoprazole (PROTONIX) 40 MG tablet Take 1 tablet (40 mg total) by mouth daily before breakfast.   ROCKLATAN 0.02-0.005 % SOLN SMARTSIG:1 Drop(s) In Eye(s) Every Evening   valsartan (DIOVAN) 320 MG tablet Take 1 tablet (320 mg total) by mouth daily.     Allergies:   Sulfa antibiotics   Social History   Socioeconomic History   Marital status: Widowed    Spouse name: Not on file   Number of children: 4   Years of education: Not on file   Highest education level: Not on file  Occupational History   Occupation: Retired  Tobacco Use   Smoking status: Never    Passive exposure: Never   Smokeless tobacco: Never  Vaping Use   Vaping  status: Never Used  Substance and Sexual Activity   Alcohol use: No   Drug use: Not on file   Sexual activity: Not on file  Other Topics Concern   Not on file  Social History Narrative   Lives with husband one daughter and two grandchildren.   Social Drivers of Corporate investment banker Strain: Low Risk  (10/09/2022)   Overall Financial Resource Strain (CARDIA)    Difficulty of Paying Living Expenses: Not very hard  Food Insecurity: No Food Insecurity (10/09/2022)   Hunger Vital Sign    Worried About Running Out of Food in the Last Year: Never true    Ran Out of Food in the Last Year: Never true  Transportation Needs: No Transportation Needs (10/09/2022)   PRAPARE - Administrator, Civil Service (Medical): No    Lack of Transportation (Non-Medical):  No  Physical Activity: Inactive (10/09/2022)   Exercise Vital Sign    Days of Exercise per Week: 0 days    Minutes of Exercise per Session: 0 min  Stress: No Stress Concern Present (04/28/2022)   Received from [facility], [facility] - Occupational Stress Questionnaire    Feeling of Stress : Not at all  Social Connections: Socially Isolated (04/28/2022)   Received from [facility], [facility]   Social Connection and Isolation Panel [NHANES]    Frequency of Communication with Friends and Family: More than three times a week    Frequency of Social Gatherings with Friends and Family: Once a week    Attends Religious Services: Never    Database administrator or Organizations: No    Attends Banker Meetings: Never    Marital Status: Widowed     Family History: The patient's family history includes Cancer in her mother; Leukemia in her sister. There is no history of Colon cancer.  ROS:   Please see the history of present illness.     All other systems reviewed and are negative.  EKGs/Labs/Other Studies Reviewed:         Cardiac Studies &  Procedures      ECHOCARDIOGRAM  ECHOCARDIOGRAM COMPLETE 11/06/2020  Narrative ECHOCARDIOGRAM REPORT    Patient Name:   Betty Goodman Date of Exam: 11/06/2020 Medical Rec #:  [id number]      Height:       60.0 in Accession #:    [id number]     Weight:       151.4 lb Date of Birth:  1945/12/20     BSA:          1.658 m Patient Age:    74 years       BP:           128/82 mmHg Patient Gender: F              HR:           95 bpm. Exam Location:  [place]  Procedure: 2D Echo, Cardiac Doppler and Color Doppler  Indications:    I27.20 Pulmonary hypertension  History:        Patient has prior history of Echocardiogram examinations, most recent 10/20/2019. Abnormal ECG, Pulmonary HTN and CKD, Arrythmias:Atrial Fibrillation, Signs/Symptoms:Shortness of Breath; Risk Factors:Hypertension, Diabetes, Dyslipidemia, Non-Smoker and Sleep Apnea.  Sonographer:    Jake Seats RDMS, RVT, RDCS Referring Phys: [id number] Netta Neat.  IMPRESSIONS   1. Left ventricular ejection fraction, by estimation, is 60 to 65%. The left ventricle has normal function. The left ventricle has no regional wall motion abnormalities. There is severe left ventricular hypertrophy. Left ventricular diastolic parameters are indeterminate. 2. Right ventricular systolic function is normal. The right ventricular size is normal. 3. Left atrial size was severely dilated. 4. Right atrial size was severely dilated. 5. The mitral valve is normal in structure. Trivial mitral valve regurgitation. No evidence of mitral stenosis. 6. The tricuspid valve is abnormal. Tricuspid valve regurgitation is moderate. 7. The aortic valve is tricuspid. There is mild calcification of the aortic valve. There is mild thickening of the aortic valve. Aortic valve regurgitation is not visualized. No aortic stenosis is present. 8. Mild pulmonary HTN, PASP is 32 mmHg. 9. The inferior vena cava is normal in size with greater than 50% respiratory  variability, suggesting right atrial pressure of 3 mmHg.  Comparison(s): Echocardiogram done 10/20/19 showed an EF of 65-70%.  FINDINGS Left Ventricle: Left ventricular ejection fraction, by estimation, is 60 to 65%. The left ventricle has normal function. The left ventricle has no regional wall motion abnormalities. Global longitudinal strain performed but not reported based on interpreter judgement due to suboptimal tracking. The left ventricular internal cavity size was normal in size. There is severe left ventricular hypertrophy. Left ventricular diastolic parameters are indeterminate.  Right Ventricle: The right ventricular size is normal. No increase in right ventricular wall thickness. Right ventricular systolic function is normal.  Left Atrium: Left atrial size was severely dilated.  Right Atrium: Right atrial size was severely dilated.  Pericardium: There is no evidence of pericardial effusion.  Mitral Valve: The mitral valve is normal in structure. Trivial mitral valve regurgitation. No evidence of mitral valve stenosis.  Tricuspid Valve: The tricuspid valve is abnormal. Tricuspid valve regurgitation is moderate . No evidence of tricuspid stenosis.  Aortic Valve: The aortic valve is tricuspid. There is mild calcification of the aortic valve. There is mild thickening of the aortic valve. There is mild aortic valve annular calcification. Aortic valve regurgitation is not visualized. No aortic stenosis is present. Aortic valve mean gradient measures 3.1 mmHg. Aortic valve peak gradient measures 6.0 mmHg. Aortic valve area, by VTI measures 1.49 cm.  Pulmonic Valve: The pulmonic valve was not well visualized. Pulmonic valve regurgitation is mild. No evidence of pulmonic stenosis.  Aorta: The aortic root is normal in size and structure.  Pulmonary Artery: Mild pulmonary HTN, PASP is 32 mmHg.  Venous: The inferior vena cava is normal in size with greater than 50% respiratory  variability, suggesting right atrial pressure of 3 mmHg.  IAS/Shunts: No atrial level shunt detected by color flow Doppler.   LEFT VENTRICLE PLAX 2D LVIDd:         2.93 cm     Diastology LVIDs:         2.09 cm     LV e' medial:    4.72 cm/s LV PW:         1.50 cm     LV E/e' medial:  18.0 LV IVS:        1.61 cm     LV e' lateral:   8.20 cm/s LVOT diam:     1.70 cm     LV E/e' lateral: 10.4 LV SV:         27 LV SV Index:   16 LVOT Area:     2.27 cm  LV Volumes (MOD) LV vol d, MOD A2C: 50.8 ml LV vol d, MOD A4C: 46.9 ml LV vol s, MOD A2C: 26.2 ml LV vol s, MOD A4C: 23.4 ml LV SV MOD A2C:     24.6 ml LV SV MOD A4C:     46.9 ml LV SV MOD BP:      25.0 ml  RIGHT VENTRICLE RV S prime:     11.25 cm/s TAPSE (M-mode): 1.6 cm  LEFT ATRIUM             Index       RIGHT ATRIUM           Index LA diam:        3.90 cm 2.35 cm/m  RA Area:     23.40 cm LA Vol (A2C):   64.3 ml 38.77 ml/m RA Volume:   70.60 ml  42.57 ml/m LA Vol (A4C):   74.7 ml 45.05 ml/m LA Biplane Vol: 74.6 ml  44.99 ml/m AORTIC VALVE AV Area (Vmax):    1.43 cm AV Area (Vmean):   1.35 cm AV Area (VTI):     1.49 cm AV Vmax:           122.17 cm/s AV Vmean:          82.179 cm/s AV VTI:            0.178 m AV Peak Grad:      6.0 mmHg AV Mean Grad:      3.1 mmHg LVOT Vmax:         76.83 cm/s LVOT Vmean:        48.867 cm/s LVOT VTI:          0.117 m LVOT/AV VTI ratio: 0.66  AORTA Ao Root diam: 2.90 cm  MITRAL VALVE               TRICUSPID VALVE MV Area (PHT):             TR Peak grad:   28.5 mmHg MV Decel Time: 174 msec    TR Vmax:        267.00 cm/s MR Peak grad: 12.1 mmHg MR Vmax:      174.00 cm/s  SHUNTS MV E velocity: 85.13 cm/s  Systemic VTI:  0.12 m Systemic Diam: 1.70 cm  Dina Rich MD Electronically signed by Dina Rich MD Signature Date/Time: 11/06/2020/2:58:54 PM    Final                Recent Labs: 10/09/2022: BUN 25; Creatinine, Ser 1.25; Potassium 4.0; Sodium 140; TSH  0.845   Recent Lipid Panel No results found for: "CHOL", "TRIG", "HDL", "CHOLHDL", "VLDL", "LDLCALC", "LDLDIRECT"  Physical Exam:   VS:  BP 127/76   Pulse (!) 38   Ht 5' (1.524 m)   Wt 139 lb 12.8 oz (63.4 kg)   SpO2 98%   BMI 27.30 kg/m  , BMI Body mass index is 27.3 kg/m. GENERAL:  Well appearing HEENT: Pupils equal round and reactive, fundi not visualized, oral mucosa unremarkable NECK:  No jugular venous distention, waveform within normal limits, carotid upstroke brisk and symmetric, no bruits, no thyromegaly LYMPHATICS:  No cervical adenopathy LUNGS:  Clear to auscultation bilaterally HEART:  IRIR.  PMI not displaced or sustained,S1 and S2 within normal limits, no S3, no S4, no clicks, no rubs, no murmurs ABD:  Flat, positive bowel sounds normal in frequency in pitch, no bruits, no rebound, no guarding, no midline pulsatile mass, no hepatomegaly, no splenomegaly EXT:  R PT 2+, L PT 1+, no edema, no cyanosis no clubbing SKIN:  No rashes no nodules NEURO:  Cranial nerves II through XII grossly intact, motor grossly intact throughout PSYCH:  Cognitively intact, oriented to person place and time   ASSESSMENT/PLAN:    HTN - BP at goal <130/80. However, bothered by dry mouth, unable to spit Chlorthalidone in half due to small size, discontinue Chlorthalidone.  If dry mouth persists, recommend further evaluation with PCP. Low suspicion other antihypertensive agents are contributory.  Continue Doxazosin  4mg  every evening, Coreg CR 40mg  daily, Diltiazem 360mg  daily, Valsartan 320mg  daily.  CT abdomen 03/25/22 adrenal glands unremarkable. No evidence of pheochromocytoma. Sleep apnea, as below. Carotid duplex 11/05/2022 with no stenosis.  Renal duplex 09/04/2022 with no renal artery stenosis.  10/01/2022 normal thyroid function. Previously politely declined referral to PREP exercise program  Coronary calcification on CT / HLD - Stable with no anginal symptoms. No indication for ischemic  evaluation.  GDMT Atorvastatin, Carvedilol, Jardiance. No ASA due to [facility]. Heart healthy diet and regular cardiovascular exercise encouraged.    PAD - Follows with vascular provider in [place]. Reports no claudication symptoms. Reports "they did the balloons and there's nothing else they can do". Notes from [facility] in [place] unavailable. Anticipate her difficulties walking are more related to deconditioning and inactivity rather than PAD as no leg pain with ambulation. Present discomfort feels different than her prior PAD symptoms. Does also have concomitant neuropathy.  Pulmonary hypertension / OSA - Previously followed by Dr. Craige Cotta of pulmonology. She did not tolerate CPAP and is not interested in trying again. No indication for advanced therapies for PAH.   Atrial fibrillation / Hypercoagulable state - Rate controlled today by auscultation today. Continue Diltiazem 360mg  BID, Eliquis 5mg  BID. Does not meet dose reduction criteria. Denies bleeding complications on Eliquis. OAC monitored by PCP, 07/2023 Hb 13.9.   DM2 - Continue to follow with PCP. Appreciate inclusion of SLT2i for cardioprotective benefit.   Screening for Secondary Hypertension:     10/09/2022    9:44 AM  Causes  Drugs/Herbals Screened     - Comments No OTC agents, one 16 oz cup of coffee  Renovascular HTN Screened     - Comments 2024 renal duplex no stenosis  Sleep Apnea Screened     - Comments 10/2022 OV with Dr. Craige Cotta to establish  Thyroid Disease Screened     - Comments 10/09/22 thyroid panel  Hyperaldosteronism Not Screened     - Comments Unable to screen due to being on ARB - already on Spiro  Pheochromocytoma N/A     - Comments CT 03/2022 normal adrenals  Cushing's Syndrome Screened     - Comments Non cushingoid appearance  Coarctation of the Aorta Screened     - Comments 09/2022 carotid duplex ordered  Compliance Screened     - Comments takes medications regularly, gets through the [place]    Relevant Labs/Studies:     Latest Ref Rng & Units 10/09/2022   10:03 AM 03/01/2020   11:52 AM 09/19/2013    2:27 PM  Basic Labs  Sodium 134 - 144 mmol/L 140  142  143   Potassium 3.5 - 5.2 mmol/L 4.0  3.6  3.9   Creatinine 0.57 - 1.00 mg/dL 4.69  6.29  5.28        Latest Ref Rng & Units 10/09/2022   10:03 AM  Thyroid   TSH 0.450 - 4.500 uIU/mL 0.845                 09/04/2022    9:27 AM  Renovascular   Renal Artery [place] Completed Yes    Disposition:    FU with Advanced Hypertension Clinic in 3-4 months  Medication Adjustments/Labs and Tests Ordered: Current medicines are reviewed at length with the patient today.  Concerns regarding medicines are outlined above.  No orders of the defined types were placed in this encounter.  No orders of the defined types were placed in this encounter.  Signed, Alver Sorrow, NP  08/13/2023 2:59 PM    [facility]

## 2023-08-13 NOTE — Patient Instructions (Addendum)
Medication Instructions:   STOP Chlorthalidone  Your dryness is more likely related to your Chlorthalidone, not your Doxazosin. We will stop your Chlorthalidone today. If you are still having persistent dryness at follow up, we can consider trying off an alternate medication. The diuretics, like Chlorthalidone, are more likely to contribute to the dryness.   Recommend using Biotene Dry Mouth Rinse every night before bed. Hard candy and peppermints can also help with dry mouth.   Follow-Up: follow up in 3-4 months with Hypertension Clinic (Dr. Duke Salvia, Alver Sorrow, NP, or Phillips Hay, [facility])   Special Instructions:   Tips to Measure your Blood Pressure Correctly  Here's what you can do to ensure a correct reading:  Don't drink a caffeinated beverage or smoke during the 30 minutes before the test.  Sit quietly for five minutes before the test begins.  During the measurement, sit in a chair with your feet on the floor and your arm supported so your elbow is at about heart level.  The inflatable part of the cuff should completely cover at least 80% of your upper arm, and the cuff should be placed on bare skin, not over a shirt.  Don't talk during the measurement.   Blood pressure categories  Blood pressure category SYSTOLIC (upper number)  DIASTOLIC (lower number)  Normal Less than 120 mm Hg and Less than 80 mm Hg  Elevated 120-129 mm Hg and Less than 80 mm Hg  High blood pressure: Stage 1 hypertension 130-139 mm Hg or 80-89 mm Hg  High blood pressure: Stage 2 hypertension 140 mm Hg or higher or 90 mm Hg or higher  Hypertensive crisis (consult your doctor immediately) Higher than 180 mm Hg and/or Higher than 120 mm Hg  Source: American Heart Association and American Stroke Association. For more on getting your blood pressure under control, buy Controlling Your Blood Pressure, a Special Health Report from [facility].   Blood Pressure Log   Date   Time  Blood  Pressure  Example: Nov 1 9 AM 124/78                                             Exercises to do While Sitting Warm-up Before starting other exercises: Sit up as straight as you can. Have your knees bent at 90 degrees, which is the shape of the capital letter "L." Keep your feet flat on the floor. Sit at the front edge of your chair, if you can. Pull in (tighten) the muscles in your abdomen and stretch your spine and neck as straight as you can. Hold this position for a few minutes. Breathe in and out evenly. Try to concentrate on your breathing, and relax your mind.  Stretching Exercise A: Arm stretch Hold your arms out straight in front of your body. Bend your hands at the wrist with your fingers pointing up, as if signaling someone to stop. Notice the slight tension in your forearms as you hold the position. Keeping your arms out and your hands bent, rotate your hands outward as far as you can and hold this stretch. Aim to have your thumbs pointing up and your pinkie fingers pointing down. Slowly repeat arm stretches for one minute as tolerated. Exercise B: Leg stretch If you can move your legs, try to "draw" letters on the floor with the toes of your foot. Write  your name with one foot. Write your name with the toes of your other foot. Slowly repeat the movements for one minute as tolerated. Exercise C: Reach for the sky Reach your hands as far over your head as you can to stretch your spine. Move your hands and arms as if you are climbing a rope. Slowly repeat the movements for one minute as tolerated.  Range of motion exercises Exercise A: Shoulder roll Let your arms hang loosely at your sides. Lift just your shoulders up toward your ears, then let them relax back down. When your shoulders feel loose, rotate your shoulders in backward and forward circles. Do shoulder rolls slowly for one minute as tolerated. Exercise B: March in place As if you are marching,  pump your arms and lift your legs up and down. Lift your knees as high as you can. If you are unable to lift your knees, just pump your arms and move your ankles and feet up and down. March in place for one minute as tolerated. Exercise C: Seated jumping jacks Let your arms hang down straight. Keeping your arms straight, lift them up over your head. Aim to point your fingers to the ceiling. While you lift your arms, straighten your legs and slide your heels along the floor to your sides, as wide as you can. As you bring your arms back down to your sides, slide your legs back together. If you are unable to use your legs, just move your arms. Slowly repeat seated jumping jacks for one minute as tolerated.  Strengthening exercises Exercise A: Shoulder squeeze Hold your arms straight out from your body to your sides, with your elbows bent and your fists pointed at the ceiling. Keeping your arms in the bent position, move them forward so your elbows and forearms meet in front of your face. Open your arms back out as wide as you can with your elbows still bent, until you feel your shoulder blades squeezing together. Hold for 5 seconds. Slowly repeat the movements forward and backward for one minute as tolerated.

## 2023-09-11 DIAGNOSIS — E1142 Type 2 diabetes mellitus with diabetic polyneuropathy: Secondary | ICD-10-CM | POA: Diagnosis not present

## 2023-09-11 DIAGNOSIS — I10 Essential (primary) hypertension: Secondary | ICD-10-CM | POA: Diagnosis not present

## 2023-09-17 ENCOUNTER — Encounter (HOSPITAL_BASED_OUTPATIENT_CLINIC_OR_DEPARTMENT_OTHER): Payer: Medicare Other | Admitting: Family

## 2023-09-24 ENCOUNTER — Encounter (HOSPITAL_BASED_OUTPATIENT_CLINIC_OR_DEPARTMENT_OTHER): Payer: Medicare Other | Admitting: Family

## 2023-09-24 DIAGNOSIS — E1142 Type 2 diabetes mellitus with diabetic polyneuropathy: Secondary | ICD-10-CM | POA: Diagnosis not present

## 2023-09-24 DIAGNOSIS — E11621 Type 2 diabetes mellitus with foot ulcer: Secondary | ICD-10-CM | POA: Diagnosis not present

## 2023-09-24 DIAGNOSIS — I10 Essential (primary) hypertension: Secondary | ICD-10-CM | POA: Diagnosis not present

## 2023-09-24 DIAGNOSIS — E1165 Type 2 diabetes mellitus with hyperglycemia: Secondary | ICD-10-CM | POA: Diagnosis not present

## 2023-09-24 DIAGNOSIS — E7801 Familial hypercholesterolemia: Secondary | ICD-10-CM | POA: Diagnosis not present

## 2023-09-30 DIAGNOSIS — E782 Mixed hyperlipidemia: Secondary | ICD-10-CM | POA: Diagnosis not present

## 2023-09-30 DIAGNOSIS — E1159 Type 2 diabetes mellitus with other circulatory complications: Secondary | ICD-10-CM | POA: Diagnosis not present

## 2023-09-30 DIAGNOSIS — Z0000 Encounter for general adult medical examination without abnormal findings: Secondary | ICD-10-CM | POA: Diagnosis not present

## 2023-09-30 DIAGNOSIS — Z1389 Encounter for screening for other disorder: Secondary | ICD-10-CM | POA: Diagnosis not present

## 2023-09-30 DIAGNOSIS — Z0001 Encounter for general adult medical examination with abnormal findings: Secondary | ICD-10-CM | POA: Diagnosis not present

## 2023-09-30 DIAGNOSIS — Z1331 Encounter for screening for depression: Secondary | ICD-10-CM | POA: Diagnosis not present

## 2023-09-30 DIAGNOSIS — I10 Essential (primary) hypertension: Secondary | ICD-10-CM | POA: Diagnosis not present

## 2023-09-30 DIAGNOSIS — I272 Pulmonary hypertension, unspecified: Secondary | ICD-10-CM | POA: Diagnosis not present

## 2023-10-02 DIAGNOSIS — E113512 Type 2 diabetes mellitus with proliferative diabetic retinopathy with macular edema, left eye: Secondary | ICD-10-CM | POA: Diagnosis not present

## 2023-10-02 DIAGNOSIS — H401122 Primary open-angle glaucoma, left eye, moderate stage: Secondary | ICD-10-CM | POA: Diagnosis not present

## 2023-10-02 DIAGNOSIS — H25812 Combined forms of age-related cataract, left eye: Secondary | ICD-10-CM | POA: Diagnosis not present

## 2023-10-02 DIAGNOSIS — E113311 Type 2 diabetes mellitus with moderate nonproliferative diabetic retinopathy with macular edema, right eye: Secondary | ICD-10-CM | POA: Diagnosis not present

## 2023-10-02 DIAGNOSIS — H401111 Primary open-angle glaucoma, right eye, mild stage: Secondary | ICD-10-CM | POA: Diagnosis not present

## 2023-10-12 DIAGNOSIS — E1142 Type 2 diabetes mellitus with diabetic polyneuropathy: Secondary | ICD-10-CM | POA: Diagnosis not present

## 2023-10-12 DIAGNOSIS — I10 Essential (primary) hypertension: Secondary | ICD-10-CM | POA: Diagnosis not present

## 2023-11-02 DIAGNOSIS — H401122 Primary open-angle glaucoma, left eye, moderate stage: Secondary | ICD-10-CM | POA: Diagnosis not present

## 2023-11-02 DIAGNOSIS — H401111 Primary open-angle glaucoma, right eye, mild stage: Secondary | ICD-10-CM | POA: Diagnosis not present

## 2023-11-03 ENCOUNTER — Ambulatory Visit: Admitting: Internal Medicine

## 2023-11-05 DIAGNOSIS — N1832 Chronic kidney disease, stage 3b: Secondary | ICD-10-CM | POA: Diagnosis not present

## 2023-11-05 DIAGNOSIS — Z170 Estrogen receptor positive status [ER+]: Secondary | ICD-10-CM | POA: Diagnosis not present

## 2023-11-05 DIAGNOSIS — C50411 Malignant neoplasm of upper-outer quadrant of right female breast: Secondary | ICD-10-CM | POA: Diagnosis not present

## 2023-11-09 DIAGNOSIS — Z170 Estrogen receptor positive status [ER+]: Secondary | ICD-10-CM | POA: Diagnosis not present

## 2023-11-09 DIAGNOSIS — Z79811 Long term (current) use of aromatase inhibitors: Secondary | ICD-10-CM | POA: Diagnosis not present

## 2023-11-09 DIAGNOSIS — C50411 Malignant neoplasm of upper-outer quadrant of right female breast: Secondary | ICD-10-CM | POA: Diagnosis not present

## 2023-11-09 DIAGNOSIS — E673 Hypervitaminosis D: Secondary | ICD-10-CM | POA: Diagnosis not present

## 2023-11-10 DIAGNOSIS — I10 Essential (primary) hypertension: Secondary | ICD-10-CM | POA: Diagnosis not present

## 2023-11-10 DIAGNOSIS — E1142 Type 2 diabetes mellitus with diabetic polyneuropathy: Secondary | ICD-10-CM | POA: Diagnosis not present

## 2023-11-11 DIAGNOSIS — Z6825 Body mass index (BMI) 25.0-25.9, adult: Secondary | ICD-10-CM | POA: Diagnosis not present

## 2023-11-11 DIAGNOSIS — I10 Essential (primary) hypertension: Secondary | ICD-10-CM | POA: Diagnosis not present

## 2023-11-11 DIAGNOSIS — J0100 Acute maxillary sinusitis, unspecified: Secondary | ICD-10-CM | POA: Diagnosis not present

## 2023-11-12 ENCOUNTER — Ambulatory Visit (HOSPITAL_BASED_OUTPATIENT_CLINIC_OR_DEPARTMENT_OTHER): Payer: Medicare Other | Admitting: Cardiovascular Disease

## 2023-11-12 ENCOUNTER — Encounter (HOSPITAL_BASED_OUTPATIENT_CLINIC_OR_DEPARTMENT_OTHER): Payer: Self-pay | Admitting: Cardiovascular Disease

## 2023-11-12 VITALS — BP 144/82 | HR 62 | Ht 60.0 in | Wt 138.0 lb

## 2023-11-12 DIAGNOSIS — I10 Essential (primary) hypertension: Secondary | ICD-10-CM

## 2023-11-12 DIAGNOSIS — I4819 Other persistent atrial fibrillation: Secondary | ICD-10-CM | POA: Diagnosis not present

## 2023-11-12 DIAGNOSIS — I1A0 Resistant hypertension: Secondary | ICD-10-CM | POA: Diagnosis not present

## 2023-11-12 DIAGNOSIS — I739 Peripheral vascular disease, unspecified: Secondary | ICD-10-CM

## 2023-11-12 MED ORDER — DOXAZOSIN MESYLATE 4 MG PO TABS
4.0000 mg | ORAL_TABLET | Freq: Two times a day (BID) | ORAL | 3 refills | Status: DC
Start: 2023-11-12 — End: 2024-02-04

## 2023-11-12 NOTE — Patient Instructions (Signed)
 Medication Instructions:  INCREASE YOUR DOXAZOSIN  TO 4 MG TWICE A DAY   Labwork: NONE  Testing/Procedures: NONE  Follow-Up: 2 MONTHS IN ADV HTN WITH DR  OR CAITLIN W NP   Any Other Special Instructions Will Be Listed Below (If Applicable). MONITOR AND LOG BLOOD PRESSURE DAILY. BRING YOUR READINGS AND MACHINE TO FOLLOW U P  If you need a refill on your cardiac medications before your next appointment, please call your pharmacy.

## 2023-11-12 NOTE — Progress Notes (Signed)
 [name]:    Date:  11/12/2023   ID:  Betty Goodman, DOB [birth year], MRN [id number]  PCP:  Anthonette Bastos, PA-C  Cardiologist:  Armida Lander, MD   Referring MD: Anthonette Bastos, PA-C   CC: Hypertension  History of Present Illness:    Betty Goodman is a 78 y.o. female with a hx of relation resistant hypertension, atrial fibrillation, pulmonary hypertension, untreated OSA, PAD, breast cancer here for follow.  She has been by cardiology established.  Hypertension clinic/2024.  Renal 08/2022.  She was diagnosed hypertension the 1990s and struggled to control it she was started on CPAP but did not tolerate it.  She was started with intolerance to multiple medications as listed below.  At her visit 07/2023 chlorthalidone  was discontinued due to concerns about dry mouth.  Multiple medications have been discontinued for this symptom and it was felt unlikely that her medications were the cause.  She remained on carvedilol , diltiazem , valsartan , and doxazosin .  She follows with vascular provider in [place] .  Discussed the use of AI scribe software for clinical note transcription with the patient, who gave verbal consent to proceed.  History of Present Illness Betty Goodman has a history of hypertension with recent home blood pressure readings ranging from 178/86 to 150/58 mmHg. During a recent visit to the cancer center, her blood pressure was 220/133 mmHg, which she attributes to forgetting her medication. She monitors her blood pressure at home and acknowledges dietary influences on her readings. She has reduced her intake of fried foods and starches, opting for chicken and vegetables instead. She uses no-salt seasonings and avoids adding salt to her meals.  She experiences dry mouth, which she associates with her blood pressure medication. She drinks one bottle of water daily, supplemented with unsweetened tea and sugar-free Coca-Cola. She acknowledges the need to increase her water  intake. She previously stopped taking chlorthalidone  due to dryness and is currently on doxazosin , valsartan , and other medications for her conditions.  Her medical history includes atrial fibrillation, for which she takes diltiazem  and Eliquis . She also has a history of breast cancer, for which she underwent surgery that included lymph node removal. She has peripheral neuropathy causing tingling in her feet and arthritis and bursitis that limit her physical activity. She has lost significant weight over the past three years, from 200 pounds to 138 pounds, partly attributed to cancer treatment.  She has a history of high cholesterol and is on atorvastatin and ezetimibe. She recently had blood work done at [facility] and the cancer center, but was not fasting during these tests. She plans to have fasting labs done in the future.  Previous antihypertensives: Chlorthalidone  - dry  mouth Clonidine - tolerated fine Lisinopril -HCTZ - switched to ARB Amlodipine - switched to Diltiazem  for rate control Losartan-transition to valsartan  Spironolactone  - dry mouth    Past Medical History:  Diagnosis Date   Atrial fibrillation (HCC)    Breast cancer (HCC)    Stage IB (pT1, pN84micro, cM0) right breast cancer   Hypertension    Kidney stone    resulting in stenting   Type 2 diabetes mellitus (HCC)    Unspecified essential hypertension     Past Surgical History:  Procedure Laterality Date   ADENOIDECTOMY     COLONOSCOPY  08/17/2019   Dr. Lurene Salm; Mild nonbleeding diverticulosis in the left colon. Recommended consider repeat in 5-10 years given questionable maternal grandmother with colon cancer   TONSILLECTOMY  TUBAL LIGATION      Current Medications: No outpatient medications have been marked as taking for the 11/12/23 encounter (Office Visit) with Maudine Sos, MD.     Allergies:   Sulfa antibiotics   Social History   Socioeconomic History   Marital status: Widowed    Spouse  name: Not on file   Number of children: 4   Years of education: Not on file   Highest education level: Not on file  Occupational History   Occupation: Retired  Tobacco Use   Smoking status: Never    Passive exposure: Never   Smokeless tobacco: Never  Vaping Use   Vaping status: Never Used  Substance and Sexual Activity   Alcohol  use: No   Drug use: Not on file   Sexual activity: Not on file  Other Topics Concern   Not on file  Social History Narrative   Lives with husband one daughter and two grandchildren.   Social Drivers of Corporate investment banker Strain: Low Risk  (10/09/2022)   Overall Financial Resource Strain (CARDIA)    Difficulty of Paying Living Expenses: Not very hard  Food Insecurity: No Food Insecurity (10/09/2022)   Hunger Vital Sign    Worried About Running Out of Food in the Last Year: Never true    Ran Out of Food in the Last Year: Never true  Transportation Needs: No Transportation Needs (10/09/2022)   PRAPARE - Administrator, Civil Service (Medical): No    Lack of Transportation (Non-Medical): No  Physical Activity: Inactive (10/09/2022)   Exercise Vital Sign    Days of Exercise per Week: 0 days    Minutes of Exercise per Session: 0 min  Stress: No Stress Concern Present (04/28/2022)   Received from [facility], [facility] of Occupational Health - Occupational Stress Questionnaire    Feeling of Stress : Not at all  Social Connections: Socially Isolated (04/28/2022)   Received from [facility], [facility]   Social Connection and Isolation Panel [NHANES]    Frequency of Communication with Friends and Family: More than three times a week    Frequency of Social Gatherings with Friends and Family: Once a week    Attends Religious Services: Never    Database administrator or Organizations: No    Attends Banker Meetings: Never    Marital Status: Widowed     Family History: The patient's  family history includes Cancer in her mother; Leukemia in her sister. There is no history of Colon cancer.  ROS:   Please see the history of present illness.     All other systems reviewed and are negative.  EKGs/Labs/Other Studies Reviewed:    EKG:  EKG is ordered today.    EKG Interpretation Date/Time:  Thursday Nov 12 2023 10:20:01 EDT Ventricular Rate:  62 PR Interval:    QRS Duration:  80 QT Interval:  414 QTC Calculation: 420 R Axis:   -57  Text Interpretation: Atrial fibrillation with premature ventricular or aberrantly conducted complexes Left axis deviation Low voltage QRS Septal infarct , age undetermined Inferior infarct , age undetermined No previous ECGs available Confirmed by Maudine Sos ([id number]) on 11/12/2023 10:39:34 AM          Recent Labs: No results found for requested labs within last 365 days.   Recent Lipid Panel No results found for: "CHOL", "TRIG", "HDL", "CHOLHDL", "VLDL", "LDLCALC", "LDLDIRECT"  Physical Exam:  VS:  BP (!) 144/82   Pulse 62   Ht 5' (1.524 m)   Wt 138 lb (62.6 kg)   SpO2 94%   BMI 26.95 kg/m  , BMI Body mass index is 26.95 kg/m. GENERAL:  Well appearing HEENT: Pupils equal round and reactive, fundi not visualized, oral mucosa unremarkable NECK:  No jugular venous distention, waveform within normal limits, carotid upstroke brisk and symmetric, no bruits, no thyromegaly LUNGS:  Clear to auscultation bilaterally HEART:  Irregularly irregular.  PMI not displaced or sustained,S1 and S2 within normal limits, no S3, no S4, no clicks, no rubs, no murmurs ABD:  Flat, positive bowel sounds normal in frequency in pitch, no bruits, no rebound, no guarding, no midline pulsatile mass, no hepatomegaly, no splenomegaly EXT:  2 plus pulses throughout, no edema, no cyanosis no clubbing SKIN:  No rashes no nodules NEURO:  Cranial nerves II through XII grossly intact, motor grossly intact throughout PSYCH:  Cognitively intact, oriented to  person place and time   ASSESSMENT/PLAN:    Assessment & Plan # Hypertension Hypertension remains uncontrolled with elevated home readings. Non-compliance and dietary habits may contribute. Dry mouth possibly related to medication or Sjogren's syndrome. - Increase doxazosin  to 8 mg daily, 4 mg morning and night. - Order 4 mg tablets of doxazosin  once current supply is depleted. - Encourage water intake to at least four 16.9 oz bottles per day. - Consider testing for Sjogren's syndrome if dry mouth persists.  # Persistent Atrial Fibrillation Atrial fibrillation managed with diltiazem  and Eliquis . Lack of exercise due to arthritis and bursitis affects cardiovascular health. - Continue diltiazem  and Eliquis  as prescribed. - Encourage light exercise, such as walking up and down a ramp.  #Hyperlipidemia Hyperlipidemia managed with atorvastatin and ezetimibe. Recent cholesterol levels need assessment. - Order fasting lipid panel to assess cholesterol levels.  # Neuropathy Neuropathy causes tingling in feet, likely related to diabetes.  # Arthritis and Bursitis Arthritis and bursitis limit physical activity. Weight loss may alleviate symptoms. - Encourage light exercise, such as walking up and down a ramp.  # Breast Cancer Breast cancer with lymph node removal. Avoid blood pressure measurements in affected arm. - Avoid blood pressure measurements in the arm with lymph node removal.  Follow-up Follow-up necessary to monitor blood pressure and cholesterol levels. - Schedule follow-up appointment in a couple of months. - Instruct to obtain fasting lipid panel at [facility].      Disposition:    FU with MD/PharmD in 2 months   Medication Adjustments/Labs and Tests Ordered: Current medicines are reviewed at length with the patient today.  Concerns regarding medicines are outlined above.  Orders Placed This Encounter  Procedures   EKG 12-Lead   No orders of the defined types were  placed in this encounter.    Signed, Maudine Sos, MD  11/12/2023 10:25 AM    [facility]

## 2023-11-30 DIAGNOSIS — N390 Urinary tract infection, site not specified: Secondary | ICD-10-CM | POA: Diagnosis not present

## 2023-11-30 DIAGNOSIS — N1832 Chronic kidney disease, stage 3b: Secondary | ICD-10-CM | POA: Diagnosis not present

## 2023-11-30 DIAGNOSIS — I4891 Unspecified atrial fibrillation: Secondary | ICD-10-CM | POA: Diagnosis not present

## 2023-11-30 DIAGNOSIS — I129 Hypertensive chronic kidney disease with stage 1 through stage 4 chronic kidney disease, or unspecified chronic kidney disease: Secondary | ICD-10-CM | POA: Diagnosis not present

## 2023-11-30 DIAGNOSIS — E1122 Type 2 diabetes mellitus with diabetic chronic kidney disease: Secondary | ICD-10-CM | POA: Diagnosis not present

## 2023-12-09 DIAGNOSIS — Z170 Estrogen receptor positive status [ER+]: Secondary | ICD-10-CM | POA: Diagnosis not present

## 2023-12-09 DIAGNOSIS — C50411 Malignant neoplasm of upper-outer quadrant of right female breast: Secondary | ICD-10-CM | POA: Diagnosis not present

## 2023-12-11 DIAGNOSIS — E1142 Type 2 diabetes mellitus with diabetic polyneuropathy: Secondary | ICD-10-CM | POA: Diagnosis not present

## 2023-12-11 DIAGNOSIS — I10 Essential (primary) hypertension: Secondary | ICD-10-CM | POA: Diagnosis not present

## 2023-12-14 DIAGNOSIS — C50411 Malignant neoplasm of upper-outer quadrant of right female breast: Secondary | ICD-10-CM | POA: Diagnosis not present

## 2023-12-14 DIAGNOSIS — Z170 Estrogen receptor positive status [ER+]: Secondary | ICD-10-CM | POA: Diagnosis not present

## 2023-12-15 DIAGNOSIS — M19011 Primary osteoarthritis, right shoulder: Secondary | ICD-10-CM | POA: Diagnosis not present

## 2023-12-15 DIAGNOSIS — M25511 Pain in right shoulder: Secondary | ICD-10-CM | POA: Diagnosis not present

## 2023-12-15 DIAGNOSIS — G8929 Other chronic pain: Secondary | ICD-10-CM | POA: Diagnosis not present

## 2023-12-15 DIAGNOSIS — S46001A Unspecified injury of muscle(s) and tendon(s) of the rotator cuff of right shoulder, initial encounter: Secondary | ICD-10-CM | POA: Diagnosis not present

## 2023-12-15 DIAGNOSIS — M12811 Other specific arthropathies, not elsewhere classified, right shoulder: Secondary | ICD-10-CM | POA: Diagnosis not present

## 2023-12-30 DIAGNOSIS — E1142 Type 2 diabetes mellitus with diabetic polyneuropathy: Secondary | ICD-10-CM | POA: Diagnosis not present

## 2023-12-30 DIAGNOSIS — R300 Dysuria: Secondary | ICD-10-CM | POA: Diagnosis not present

## 2023-12-30 DIAGNOSIS — I10 Essential (primary) hypertension: Secondary | ICD-10-CM | POA: Diagnosis not present

## 2023-12-30 DIAGNOSIS — E7801 Familial hypercholesterolemia: Secondary | ICD-10-CM | POA: Diagnosis not present

## 2023-12-30 DIAGNOSIS — Z130 Encounter for screening for diseases of the blood and blood-forming organs and certain disorders involving the immune mechanism: Secondary | ICD-10-CM | POA: Diagnosis not present

## 2024-01-06 DIAGNOSIS — I10 Essential (primary) hypertension: Secondary | ICD-10-CM | POA: Diagnosis not present

## 2024-01-06 DIAGNOSIS — E7849 Other hyperlipidemia: Secondary | ICD-10-CM | POA: Diagnosis not present

## 2024-01-06 DIAGNOSIS — E1159 Type 2 diabetes mellitus with other circulatory complications: Secondary | ICD-10-CM | POA: Diagnosis not present

## 2024-01-06 DIAGNOSIS — I272 Pulmonary hypertension, unspecified: Secondary | ICD-10-CM | POA: Diagnosis not present

## 2024-01-11 DIAGNOSIS — I10 Essential (primary) hypertension: Secondary | ICD-10-CM | POA: Diagnosis not present

## 2024-01-11 DIAGNOSIS — E1142 Type 2 diabetes mellitus with diabetic polyneuropathy: Secondary | ICD-10-CM | POA: Diagnosis not present

## 2024-01-19 ENCOUNTER — Encounter (HOSPITAL_BASED_OUTPATIENT_CLINIC_OR_DEPARTMENT_OTHER): Admitting: Cardiovascular Disease

## 2024-01-19 NOTE — Progress Notes (Deleted)
 [name]:    Date:  01/19/2024   Betty Goodman, DOB 17-Feb-1946, MRN [id number]  PCP:  Betty Greig DEL, PA-C  Cardiologist:  Alvan Carrier, MD   Referring MD: Betty Greig DEL, PA-C   CC: Hypertension  History of Present Illness:    Betty Goodman is a 78 y.o. female with a hx of relation resistant hypertension, atrial fibrillation, pulmonary hypertension, untreated OSA, PAD, breast cancer here for follow.  She has been by cardiology established.  Hypertension clinic/2024.  Renal 08/2022.  She was diagnosed hypertension the 1990s and struggled to control it she was started on CPAP but did not tolerate it.  She was started with intolerance to multiple medications as listed below.  At her visit 07/2023 chlorthalidone  was discontinued due to concerns about dry mouth.  Multiple medications have been discontinued for this symptom and it was felt unlikely that her medications were the cause.  She remained on carvedilol , diltiazem , valsartan , and doxazosin .  She follows with vascular provider in [place] .  Ms. Betty Goodman has a history of hypertension with recent home blood pressure readings ranging from 178/86 to 150/58 mmHg. During a recent visit to the cancer center, her blood pressure was 220/133 mmHg, which she attributes to forgetting her medication. She monitors her blood pressure at home and acknowledges dietary influences on her readings. She has reduced her intake of fried foods and starches, opting for chicken and vegetables instead. She uses no-salt seasonings and avoids adding salt to her meals.  At her visit 11/2023 she reported significant weight loss down to 138 from 200 pounds.  This was partially attributed to her treatment for breast cancer.  She also has a history of atrial fibrillation which has been stable on diltiazem  and Eliquis .  At her initial visit doxazosin  was increased and she was encouraged to increase her water intake.   Discussed the use of AI scribe software  for clinical note transcription with the patient, who gave verbal consent to proceed.  History of Present Illness   Previous antihypertensives: Chlorthalidone  - dry  mouth Clonidine - tolerated fine Lisinopril -HCTZ - switched to ARB Amlodipine - switched to Diltiazem  for rate control Losartan-transition to valsartan  Spironolactone  - dry mouth    Past Medical History:  Diagnosis Date   Atrial fibrillation (HCC)    Breast cancer (HCC)    Stage IB (pT1, pN72micro, cM0) right breast cancer   Hypertension    Kidney stone    resulting in stenting   Type 2 diabetes mellitus (HCC)    Unspecified essential hypertension     Past Surgical History:  Procedure Laterality Date   ADENOIDECTOMY     COLONOSCOPY  08/17/2019   Dr. Celia; Mild nonbleeding diverticulosis in the left colon. Recommended consider repeat in 5-10 years given questionable maternal grandmother with colon cancer   TONSILLECTOMY     TUBAL LIGATION      Current Medications: No outpatient medications have been marked as taking for the 01/19/24 encounter (Appointment) with Raford Riggs, MD.     Allergies:   Sulfa antibiotics   Social History   Socioeconomic History   Marital status: Widowed    Spouse name: Not on file   Number of children: 4   Years of education: Not on file   Highest education level: Not on file  Occupational History   Occupation: Retired  Tobacco Use   Smoking status: Never    Passive exposure: Never   Smokeless tobacco: Never  Vaping  Use   Vaping status: Never Used  Substance and Sexual Activity   Alcohol  use: No   Drug use: Not on file   Sexual activity: Not on file  Other Topics Concern   Not on file  Social History Narrative   Lives with husband one daughter and two grandchildren.   Social Drivers of Corporate investment banker Strain: Low Risk  (10/09/2022)   Overall Financial Resource Strain (CARDIA)    Difficulty of Paying Living Expenses: Not very hard  Food  Insecurity: No Food Insecurity (10/09/2022)   Hunger Vital Sign    Worried About Running Out of Food in the Last Year: Never true    Ran Out of Food in the Last Year: Never true  Transportation Needs: No Transportation Needs (10/09/2022)   PRAPARE - Administrator, Civil Service (Medical): No    Lack of Transportation (Non-Medical): No  Physical Activity: Inactive (10/09/2022)   Exercise Vital Sign    Days of Exercise per Week: 0 days    Minutes of Exercise per Session: 0 min  Stress: No Stress Concern Present (04/28/2022)   Received from [facility] of Occupational Health - Occupational Stress Questionnaire    Feeling of Stress : Not at all  Social Connections: Socially Isolated (04/28/2022)   Received from [facility]   Social Connection and Isolation Panel    In a typical week, how many times do you talk on the phone with family, friends, or neighbors?: More than three times a week    How often do you get together with friends or relatives?: Once a week    How often do you attend church or religious services?: Never    Do you belong to any clubs or organizations such as church groups, unions, fraternal or athletic groups, or school groups?: No    How often do you attend meetings of the clubs or organizations you belong to?: Never    Are you married, widowed, divorced, separated, never married, or living with a partner?: Widowed     Family History: The patient's family history includes Cancer in her mother; Leukemia in her sister. There is no history of Colon cancer.  ROS:   Please see the history of present illness.     All other systems reviewed and are negative.  EKGs/Labs/Other Studies Reviewed:    EKG:  EKG is ordered today.    EKG Interpretation Date/Time:    Ventricular Rate:    PR Interval:    QRS Duration:    QT Interval:    QTC Calculation:   R Axis:      Text Interpretation:            Recent Labs: No results  found for requested labs within last 365 days.   Recent Lipid Panel No results found for: CHOL, TRIG, HDL, CHOLHDL, VLDL, LDLCALC, LDLDIRECT  Physical Exam:    VS:  There were no vitals taken for this visit. , BMI There is no height or weight on file to calculate BMI. GENERAL:  Well appearing HEENT: Pupils equal round and reactive, fundi not visualized, oral mucosa unremarkable NECK:  No jugular venous distention, waveform within normal limits, carotid upstroke brisk and symmetric, no bruits, no thyromegaly LUNGS:  Clear to auscultation bilaterally HEART:  Irregularly irregular.  PMI not displaced or sustained,S1 and S2 within normal limits, no S3, no S4, no clicks, no rubs, no murmurs ABD:  Flat, positive bowel sounds  normal in frequency in pitch, no bruits, no rebound, no guarding, no midline pulsatile mass, no hepatomegaly, no splenomegaly EXT:  2 plus pulses throughout, no edema, no cyanosis no clubbing SKIN:  No rashes no nodules NEURO:  Cranial nerves II through XII grossly intact, motor grossly intact throughout PSYCH:  Cognitively intact, oriented to person place and time   ASSESSMENT/PLAN:    Assessment & Plan # Hypertension Hypertension remains uncontrolled with elevated home readings. Non-compliance and dietary habits may contribute. Dry mouth possibly related to medication or Sjogren's syndrome. - Increase doxazosin  to 8 mg daily, 4 mg morning and night. - Order 4 mg tablets of doxazosin  once current supply is depleted. - Encourage water intake to at least four 16.9 oz bottles per day. - Consider testing for Sjogren's syndrome if dry mouth persists.  # Persistent Atrial Fibrillation Atrial fibrillation managed with diltiazem  and Eliquis . Lack of exercise due to arthritis and bursitis affects cardiovascular health. - Continue diltiazem  and Eliquis  as prescribed. - Encourage light exercise, such as walking up and down a ramp.  #Hyperlipidemia Hyperlipidemia  managed with atorvastatin and ezetimibe. Recent cholesterol levels need assessment. - Order fasting lipid panel to assess cholesterol levels.  # Neuropathy Neuropathy causes tingling in feet, likely related to diabetes.  # Arthritis and Bursitis Arthritis and bursitis limit physical activity. Weight loss may alleviate symptoms. - Encourage light exercise, such as walking up and down a ramp.  # Breast Cancer Breast cancer with lymph node removal. Avoid blood pressure measurements in affected arm. - Avoid blood pressure measurements in the arm with lymph node removal.  Follow-up Follow-up necessary to monitor blood pressure and cholesterol levels. - Schedule follow-up appointment in a couple of months. - Instruct to obtain fasting lipid panel at [facility].      Disposition:    FU with MD/PharmD in 2 months   Medication Adjustments/Labs and Tests Ordered: Current medicines are reviewed at length with the patient today.  Concerns regarding medicines are outlined above.  No orders of the defined types were placed in this encounter.  No orders of the defined types were placed in this encounter.    Signed, Annabella Scarce, MD  01/19/2024 8:02 AM    [facility]

## 2024-02-02 ENCOUNTER — Encounter (HOSPITAL_BASED_OUTPATIENT_CLINIC_OR_DEPARTMENT_OTHER): Admitting: Cardiovascular Disease

## 2024-02-04 ENCOUNTER — Ambulatory Visit (INDEPENDENT_AMBULATORY_CARE_PROVIDER_SITE_OTHER): Admitting: Family

## 2024-02-04 VITALS — BP 148/88 | HR 60 | Ht 60.0 in | Wt 142.7 lb

## 2024-02-04 DIAGNOSIS — I4821 Permanent atrial fibrillation: Secondary | ICD-10-CM

## 2024-02-04 DIAGNOSIS — I2510 Atherosclerotic heart disease of native coronary artery without angina pectoris: Secondary | ICD-10-CM | POA: Diagnosis not present

## 2024-02-04 DIAGNOSIS — E785 Hyperlipidemia, unspecified: Secondary | ICD-10-CM

## 2024-02-04 DIAGNOSIS — E1165 Type 2 diabetes mellitus with hyperglycemia: Secondary | ICD-10-CM

## 2024-02-04 DIAGNOSIS — I1A0 Resistant hypertension: Secondary | ICD-10-CM | POA: Diagnosis not present

## 2024-02-04 DIAGNOSIS — R682 Dry mouth, unspecified: Secondary | ICD-10-CM

## 2024-02-04 DIAGNOSIS — H04123 Dry eye syndrome of bilateral lacrimal glands: Secondary | ICD-10-CM | POA: Diagnosis not present

## 2024-02-04 DIAGNOSIS — I10 Essential (primary) hypertension: Secondary | ICD-10-CM

## 2024-02-04 MED ORDER — DILTIAZEM HCL ER COATED BEADS 360 MG PO CP24: 360.0000 mg | ORAL_CAPSULE | Freq: Every day | ORAL | 3 refills | Status: AC

## 2024-02-04 MED ORDER — CARVEDILOL PHOSPHATE ER 40 MG PO CP24: 40.0000 mg | ORAL_CAPSULE | Freq: Every day | ORAL | 3 refills | Status: DC

## 2024-02-04 MED ORDER — DOXAZOSIN MESYLATE 4 MG PO TABS
4.0000 mg | ORAL_TABLET | Freq: Two times a day (BID) | ORAL | Status: DC
Start: 2024-02-04 — End: 2024-03-24

## 2024-02-04 NOTE — Progress Notes (Unsigned)
 Advanced Hypertension Clinic Assessment:    Date:  02/05/2024   ID:  Betty Goodman, DOB [birth year], MRN [id number]  PCP:  Jolee Greig DEL, PA-C  Cardiologist:  Alvan Carrier, MD  Nephrologist:  Referring MD: Jolee Greig DEL, PA-C   CC: Hypertension  History of Present Illness:    Betty Goodman is a 78 y.o. female with a hx of atrial fibrillation, resistant hypertension, coronary artery calcification on CT, pulmonary hypertension, DM2, breast cancer, OSA, PAD here to follow up in the Advanced Hypertension Clinic.   She saw Almarie Crate, NP in our [place] office and given persistent difficulties controlling hypertension was referred to Advanced Hypertension Clinic. Her Coreg  CR was increased to 40mg  daily. Aldactone , Diltiazem , Losartan continued. Renal duplex 09/04/22 with no renal artery stenosis.   At initial Hypertension Clinic visit 10/09/22 it was noted Betty Goodman was diagnosed with hypertension in the late 1990s. It has been difficult to control. No tobacco nor etoh use.  No formal exercise routine but was following low-sodium, heart of the diet.  At that visit losartan was stopped and she was started on valsartan  320 mg daily.  Spironolactone  was reduced to 25 mg daily and moved to morning dosing due to dry mouth.  Carotid duplex ordered with no carotid stenosis.  She had office visit with Almarie Crate, NP 10/21/2022 with elevated BP 186/112 and repeat 162/83.  She had not yet taken her valsartan .  She was following with Dr. Arnette at [facility] and had recent procedure, details unknown and records requested.   Sleep study with severe sleep apnea established with Dr. Shellia and started on AutoPAP which she did not tolerate and subsequently returned.   Seen 11/27/22 with BP 158/102 in setting of self reducing Coreg  from 40mg  to 20mg  due to dry mouth.  Discussed more likely related to spironolactone  which was discontinued.  Carvedilol  CR was returned to 40 mg.  She was started on  doxazosin  2 mg every evening.  At visit 04/09/23 Chlorthalidone  reduce due to dry mouth. Doxazosin  increased from 2mg  to 4mg .  At visit 08/13/2023 chlorthalidone  discontinued due to persistent dry mouth.  At visit 11/12/2023 doxazosin  increased to 4 mg twice daily  Presents today for follow up. Still complains of feeling dry. Discussed unlikely related to Doxazosin .  She does note she is self reduced doxazosin  to 2 mg twice daily.  Subsequently blood pressure readings at home have been elevated.  She additionally notes dry mouth and dry eyes and that she will wake in the morning with her lips peeling from being dry.  No chest pain, dyspnea, palpitations.  Previous antihypertensives: Chlorthalidone  - dry  mouth Clonidine - tolerated fine Lisinopril -HCTZ - switched to ARB Amlodipine - switched to Diltiazem  for rate control Losartan-transition to valsartan  Spironolactone  - dry mouth Hydralazine   Past Medical History:  Diagnosis Date   Atrial fibrillation (HCC)    Breast cancer (HCC)    Stage IB (pT1, pN52micro, cM0) right breast cancer   Hypertension    Kidney stone    resulting in stenting   Type 2 diabetes mellitus (HCC)    Unspecified essential hypertension    Past Surgical History:  Procedure Laterality Date   ADENOIDECTOMY     COLONOSCOPY  08/17/2019   Dr. Celia; Mild nonbleeding diverticulosis in the left colon. Recommended consider repeat in 5-10 years given questionable maternal grandmother with colon cancer   TONSILLECTOMY     TUBAL LIGATION     Current Medications: Current Meds  Medication Sig   alendronate (FOSAMAX) 70 MG tablet Take 70 mg by mouth once a week. Take with a full glass of water on an empty stomach.   apixaban  (ELIQUIS ) 5 MG TABS tablet Take 1 tablet (5 mg total) by mouth 2 (two) times daily.   atorvastatin (LIPITOR) 40 MG tablet Take 40 mg by mouth at bedtime.   azelastine  (ASTELIN ) 0.1 % nasal spray Place 1 spray into both nostrils every morning. Use in  each nostril as directed   brimonidine-timolol (COMBIGAN) 0.2-0.5 % ophthalmic solution Place 1 drop into both eyes every 12 (twelve) hours.   Cholecalciferol (VITAMIN D) 50 MCG (2000 UT) CAPS Take 2 capsules by mouth 2 (two) times daily.   dorzolamide (TRUSOPT) 2 % ophthalmic solution Place 1 drop into the left eye 2 (two) times daily.   empagliflozin (JARDIANCE) 25 MG TABS tablet Take 25 mg by mouth daily.   ezetimibe (ZETIA) 10 MG tablet Take 1 tablet by mouth daily.   glipiZIDE (GLUCOTROL) 10 MG tablet Take 10 mg by mouth daily before breakfast.   ketorolac  (ACULAR ) 0.5 % ophthalmic solution 1 drop 4 (four) times daily.   letrozole (FEMARA) 2.5 MG tablet Take 2.5 mg by mouth daily.   Liraglutide (VICTOZA [place]) Inject 1.8 mg into the skin daily.   loratadine (CLARITIN) 10 MG tablet Take 10 mg by mouth daily.   metFORMIN (GLUCOPHAGE-XR) 500 MG 24 hr tablet Take 1,000 mg by mouth 2 (two) times daily.   pantoprazole  (PROTONIX ) 40 MG tablet Take 1 tablet (40 mg total) by mouth daily before breakfast.   ROCKLATAN 0.02-0.005 % SOLN SMARTSIG:1 Drop(s) In Eye(s) Every Evening   valsartan  (DIOVAN ) 320 MG tablet Take 1 tablet (320 mg total) by mouth daily.   [DISCONTINUED] carvedilol  (COREG  CR) 40 MG 24 hr capsule Take 1 capsule (40 mg total) by mouth daily.   [DISCONTINUED] diltiazem  (CARDIZEM  CD) 360 MG 24 hr capsule Take 360 mg by mouth daily.   [DISCONTINUED] doxazosin  (CARDURA ) 4 MG tablet Take 1 tablet (4 mg total) by mouth 2 (two) times daily. (Patient taking differently: Take 2 mg by mouth 2 (two) times daily.)     Allergies:   Sulfa antibiotics   Social History   Socioeconomic History   Marital status: Widowed    Spouse name: Not on file   Number of children: 4   Years of education: Not on file   Highest education level: Not on file  Occupational History   Occupation: Retired  Tobacco Use   Smoking status: Never    Passive exposure: Never   Smokeless tobacco: Never  Vaping Use    Vaping status: Never Used  Substance and Sexual Activity   Alcohol  use: No   Drug use: Not on file   Sexual activity: Not on file  Other Topics Concern   Not on file  Social History Narrative   Lives with husband one daughter and two grandchildren.   Social Drivers of Corporate investment banker Strain: Low Risk  (02/05/2024)   Received from [facility]   Overall Financial Resource Strain (CARDIA)    How hard is it for you to pay for the very basics like food, housing, medical care, and heating?: Not hard at all  Food Insecurity: No Food Insecurity (02/05/2024)   Received from [facility]   Hunger Vital Sign    Within the past 12 months, you worried that your food would run out before you got the money to buy  more.: Never true    Within the past 12 months, the food you bought just didn't last and you didn't have money to get more.: Never true  Transportation Needs: No Transportation Needs (02/05/2024)   Received from [facility] - Transportation    Lack of Transportation (Medical): No    Lack of Transportation (Non-Medical): No  Physical Activity: Insufficiently Active (02/05/2024)   Received from [facility]   Exercise Vital Sign    On average, how many days per week do you engage in moderate to strenuous exercise (like a brisk walk)?: 1 day    On average, how many minutes do you engage in exercise at this level?: 10 min  Stress: No Stress Concern Present (02/05/2024)   Received from [facility] - Occupational Stress Questionnaire    Do you feel stress - tense, restless, nervous, or anxious, or unable to sleep at night because your mind is troubled all the time - these days?: Not at all  Social Connections: Socially Isolated (02/05/2024)   Received from [facility]   Social Connection and Isolation Panel    In a typical week, how many times do you talk on the phone with family, friends, or neighbors?: More  than three times a week    How often do you get together with friends or relatives?: Twice a week    How often do you attend church or religious services?: Never    Do you belong to any clubs or organizations such as church groups, unions, fraternal or athletic groups, or school groups?: No    How often do you attend meetings of the clubs or organizations you belong to?: Never    Are you married, widowed, divorced, separated, never married, or living with a partner?: Widowed     Family History: The patient's family history includes Cancer in her mother; Leukemia in her sister. There is no history of Colon cancer.  ROS:   Please see the history of present illness.     All other systems reviewed and are negative.  EKGs/Labs/Other Studies Reviewed:               Recent Labs: No results found for requested labs within last 365 days.   Recent Lipid Panel No results found for: CHOL, TRIG, HDL, CHOLHDL, VLDL, LDLCALC, LDLDIRECT  Physical Exam:   VS:  BP (!) 148/88   Pulse 60   Ht 5' (1.524 m)   Wt 142 lb 11.2 oz (64.7 kg)   SpO2 94%   BMI 27.87 kg/m  , BMI Body mass index is 27.87 kg/m. GENERAL:  Well appearing HEENT: Pupils equal round and reactive, fundi not visualized, oral mucosa unremarkable NECK:  No jugular venous distention, waveform within normal limits, carotid upstroke brisk and symmetric, no bruits, no thyromegaly LYMPHATICS:  No cervical adenopathy LUNGS:  Clear to auscultation bilaterally HEART:  IRIR.  PMI not displaced or sustained,S1 and S2 within normal limits, no S3, no S4, no clicks, no rubs, no murmurs ABD:  Flat, positive bowel sounds normal in frequency in pitch, no bruits, no rebound, no guarding, no midline pulsatile mass, no hepatomegaly, no splenomegaly EXT:  R PT 2+, L PT 1+, no edema, no cyanosis no clubbing SKIN:  No rashes no nodules NEURO:  Cranial nerves II through XII grossly intact, motor grossly intact throughout PSYCH:   Cognitively intact, oriented to person place and time   ASSESSMENT/PLAN:  HTN - BP not at goal <130/80. Educated to return Doxazosin  to 4mg  BID for improvement in BP control. Reiterated doxazosin  would not contribute to dryness Continue Coreg  CR 40mg  daily, Diltiazem  360mg  daily, Valsartan  320mg  daily.  CT abdomen 03/25/22 adrenal glands unremarkable. No evidence of pheochromocytoma. Sleep apnea, as below. Carotid duplex 11/05/2022 with no stenosis.  Renal duplex 09/04/2022 with no renal artery stenosis.  10/01/2022 normal thyroid  function. Previously politely declined referral to PREP exercise program  Dry mouth / Dry eyes -persistent despite discontinuation of clonidine and diuretics.  Plan for further evaluation for possible Sjogren's. ANA, rheumatoid factor, SSA & SSB today. If abnormal refer to rheumatology.  Coronary calcification on CT / HLD - Stable with no anginal symptoms. No indication for ischemic evaluation.  GDMT Atorvastatin, Carvedilol , Jardiance. No ASA due to OAC. Heart healthy diet and regular cardiovascular exercise encouraged.    PAD - Follows with vascular provider in [place]. Reports no claudication symptoms. Reports they did the balloons and there's nothing else they can do. Notes from [facility] in [place] unavailable. Does also have concomitant neuropathy.  Pulmonary hypertension / OSA - Previously followed by Dr. Shellia of pulmonology. She did not tolerate CPAP and is not interested in trying again. No indication for advanced therapies for PAH.   Atrial fibrillation / Hypercoagulable state - Rate controlled today by auscultation today. Continue Diltiazem  360mg  BID, Eliquis  5mg  BID. Does not meet dose reduction criteria. Denies bleeding complications on Eliquis . OAC monitored by PCP, 07/2023 Hb 13.9.   DM2 - Continue to follow with PCP. Appreciate inclusion of SLT2i for cardioprotective benefit.   Screening for Secondary Hypertension:     10/09/2022    9:44 AM  Causes   Drugs/Herbals Screened     - Comments No OTC agents, one 16 oz cup of coffee  Renovascular HTN Screened     - Comments 2024 renal duplex no stenosis  Sleep Apnea Screened     - Comments 10/2022 OV with Dr. Shellia to establish  Thyroid  Disease Screened     - Comments 10/09/22 thyroid  panel  Hyperaldosteronism Not Screened     - Comments Unable to screen due to being on ARB - already on Spiro  Pheochromocytoma N/A     - Comments CT 03/2022 normal adrenals  Cushing's Syndrome Screened     - Comments Non cushingoid appearance  Coarctation of the Aorta Screened     - Comments 09/2022 carotid duplex ordered  Compliance Screened     - Comments takes medications regularly, gets through the [place]    Relevant Labs/Studies:    Latest Ref Rng & Units 10/09/2022   10:03 AM 03/01/2020   11:52 AM 09/19/2013    2:27 PM  Basic Labs  Sodium 134 - 144 mmol/L 140  142  143   Potassium 3.5 - 5.2 mmol/L 4.0  3.6  3.9   Creatinine 0.57 - 1.00 mg/dL 8.74  8.92  9.15        Latest Ref Rng & Units 10/09/2022   10:03 AM  Thyroid    TSH 0.450 - 4.500 uIU/mL 0.845                 09/04/2022    9:27 AM  Renovascular   Renal Artery US  Completed Yes    Disposition:    FU with Advanced Hypertension Clinic in 2 months  Medication Adjustments/Labs and Tests Ordered: Current medicines are reviewed at length with the patient today.  Concerns regarding medicines are outlined above.  Orders Placed This Encounter  Procedures   ANA   Sjogrens syndrome-A extractable nuclear antibody   Sjogrens syndrome-B extractable nuclear antibody   Rheumatoid Factor   Direct LDL   HgB A1c   Meds ordered this encounter  Medications   doxazosin  (CARDURA ) 4 MG tablet    Sig: Take 1 tablet (4 mg total) by mouth 2 (two) times daily.    Discontinue 2mg  dose, NEW DOSE   diltiazem  (CARDIZEM  CD) 360 MG 24 hr capsule    Sig: Take 1 capsule (360 mg total) by mouth daily.    Dispense:  90 capsule    Refill:  3   carvedilol  (COREG   CR) 40 MG 24 hr capsule    Sig: Take 1 capsule (40 mg total) by mouth daily.    Dispense:  90 capsule    Refill:  3    09/15/22 Dose Increase   Signed, Reche GORMAN Finder, NP  02/05/2024 7:22 AM    [facility]

## 2024-02-04 NOTE — Patient Instructions (Signed)
 Medication Instructions:   RESTART Doxazosin  one(1) tablet by mouth ( 4 mg) twice daily.   *If you need a refill on your cardiac medications before your next appointment, please call your pharmacy*  Lab Work:  TODAY!!!!  If you have labs (blood work) drawn today and your tests are completely normal, you will receive your results only by: MyChart Message (if you have MyChart) OR A paper copy in the mail If you have any lab test that is abnormal or we need to change your treatment, we will call you to review the results.  Testing/Procedures:  None ordered.   Follow-Up: At [facility], you and your health needs are our priority.  As part of our continuing mission to provide you with exceptional heart care, our providers are all part of one team.  This team includes your primary Cardiologist (physician) and Advanced Practice Providers or APPs (Physician Assistants and Nurse Practitioners) who all work together to provide you with the care you need, when you need it.  Your next appointment:   2 month(s)  Provider:   Kristin Alvstad, PharmD    We recommend signing up for the patient portal called MyChart.  Sign up information is provided on this After Visit Summary.  MyChart is used to connect with patients for Virtual Visits (Telemedicine).  Patients are able to view lab/test results, encounter notes, upcoming appointments, etc.  Non-urgent messages can be sent to your provider as well.   To learn more about what you can do with MyChart, go to [email or web address].

## 2024-02-05 ENCOUNTER — Encounter (HOSPITAL_BASED_OUTPATIENT_CLINIC_OR_DEPARTMENT_OTHER): Payer: Self-pay | Admitting: Family

## 2024-02-05 ENCOUNTER — Ambulatory Visit (HOSPITAL_BASED_OUTPATIENT_CLINIC_OR_DEPARTMENT_OTHER): Payer: Self-pay | Admitting: Family

## 2024-02-05 DIAGNOSIS — Z87891 Personal history of nicotine dependence: Secondary | ICD-10-CM | POA: Diagnosis not present

## 2024-02-05 DIAGNOSIS — R109 Unspecified abdominal pain: Secondary | ICD-10-CM | POA: Diagnosis not present

## 2024-02-05 DIAGNOSIS — E1122 Type 2 diabetes mellitus with diabetic chronic kidney disease: Secondary | ICD-10-CM | POA: Diagnosis not present

## 2024-02-05 DIAGNOSIS — I272 Pulmonary hypertension, unspecified: Secondary | ICD-10-CM | POA: Diagnosis not present

## 2024-02-05 DIAGNOSIS — Z79899 Other long term (current) drug therapy: Secondary | ICD-10-CM | POA: Diagnosis not present

## 2024-02-05 DIAGNOSIS — M810 Age-related osteoporosis without current pathological fracture: Secondary | ICD-10-CM | POA: Diagnosis not present

## 2024-02-05 DIAGNOSIS — N3001 Acute cystitis with hematuria: Secondary | ICD-10-CM | POA: Diagnosis not present

## 2024-02-05 DIAGNOSIS — N189 Chronic kidney disease, unspecified: Secondary | ICD-10-CM | POA: Diagnosis not present

## 2024-02-05 DIAGNOSIS — I4891 Unspecified atrial fibrillation: Secondary | ICD-10-CM | POA: Diagnosis not present

## 2024-02-05 DIAGNOSIS — E785 Hyperlipidemia, unspecified: Secondary | ICD-10-CM | POA: Diagnosis not present

## 2024-02-05 DIAGNOSIS — Z7984 Long term (current) use of oral hypoglycemic drugs: Secondary | ICD-10-CM | POA: Diagnosis not present

## 2024-02-05 DIAGNOSIS — K449 Diaphragmatic hernia without obstruction or gangrene: Secondary | ICD-10-CM | POA: Diagnosis not present

## 2024-02-05 DIAGNOSIS — Z882 Allergy status to sulfonamides status: Secondary | ICD-10-CM | POA: Diagnosis not present

## 2024-02-05 DIAGNOSIS — K7689 Other specified diseases of liver: Secondary | ICD-10-CM | POA: Diagnosis not present

## 2024-02-05 DIAGNOSIS — Z853 Personal history of malignant neoplasm of breast: Secondary | ICD-10-CM | POA: Diagnosis not present

## 2024-02-05 DIAGNOSIS — I129 Hypertensive chronic kidney disease with stage 1 through stage 4 chronic kidney disease, or unspecified chronic kidney disease: Secondary | ICD-10-CM | POA: Diagnosis not present

## 2024-02-05 LAB — RHEUMATOID FACTOR: Rheumatoid fact SerPl-aCnc: <10 [IU]/mL (ref <14.0)

## 2024-02-05 LAB — ANA: Anti Nuclear Antibody (ANA): NEGATIVE

## 2024-02-05 LAB — LDL CHOLESTEROL, DIRECT: LDL Direct: 85 mg/dL (ref 0–99)

## 2024-02-05 LAB — SJOGRENS SYNDROME-B EXTRACTABLE NUCLEAR ANTIBODY: ENA SSB (LA) Ab: <0.2 AI (ref 0.0–0.9)

## 2024-02-05 LAB — SJOGRENS SYNDROME-A EXTRACTABLE NUCLEAR ANTIBODY: ENA SSA (RO) Ab: <0.2 AI (ref 0.0–0.9)

## 2024-02-05 LAB — HEMOGLOBIN A1C
Est. average glucose Bld gHb Est-mCnc: 214 mg/dL
Hgb A1c MFr Bld: 9.1 % — ABNORMAL HIGH (ref 4.8–5.6)

## 2024-02-05 NOTE — ED Triage Notes (Signed)
 Generalized abdominal pain w/ acute onset at 1500. Acute onset PTA of SOB, pt states it is because I was rushing to get here O2 WNL

## 2024-02-10 DIAGNOSIS — Z6826 Body mass index (BMI) 26.0-26.9, adult: Secondary | ICD-10-CM | POA: Diagnosis not present

## 2024-02-10 DIAGNOSIS — R1033 Periumbilical pain: Secondary | ICD-10-CM | POA: Diagnosis not present

## 2024-02-10 DIAGNOSIS — N3000 Acute cystitis without hematuria: Secondary | ICD-10-CM | POA: Diagnosis not present

## 2024-02-11 ENCOUNTER — Ambulatory Visit: Admitting: Cardiology

## 2024-02-11 DIAGNOSIS — I10 Essential (primary) hypertension: Secondary | ICD-10-CM | POA: Diagnosis not present

## 2024-02-11 DIAGNOSIS — E1142 Type 2 diabetes mellitus with diabetic polyneuropathy: Secondary | ICD-10-CM | POA: Diagnosis not present

## 2024-02-22 DIAGNOSIS — H401111 Primary open-angle glaucoma, right eye, mild stage: Secondary | ICD-10-CM | POA: Diagnosis not present

## 2024-02-22 DIAGNOSIS — E113512 Type 2 diabetes mellitus with proliferative diabetic retinopathy with macular edema, left eye: Secondary | ICD-10-CM | POA: Diagnosis not present

## 2024-02-22 DIAGNOSIS — H401122 Primary open-angle glaucoma, left eye, moderate stage: Secondary | ICD-10-CM | POA: Diagnosis not present

## 2024-02-22 DIAGNOSIS — H25812 Combined forms of age-related cataract, left eye: Secondary | ICD-10-CM | POA: Diagnosis not present

## 2024-03-04 ENCOUNTER — Ambulatory Visit: Admitting: Cardiology

## 2024-03-08 DIAGNOSIS — N184 Chronic kidney disease, stage 4 (severe): Secondary | ICD-10-CM | POA: Diagnosis not present

## 2024-03-08 DIAGNOSIS — E7849 Other hyperlipidemia: Secondary | ICD-10-CM | POA: Diagnosis not present

## 2024-03-08 DIAGNOSIS — E1122 Type 2 diabetes mellitus with diabetic chronic kidney disease: Secondary | ICD-10-CM | POA: Diagnosis not present

## 2024-03-08 DIAGNOSIS — E1165 Type 2 diabetes mellitus with hyperglycemia: Secondary | ICD-10-CM | POA: Diagnosis not present

## 2024-03-08 DIAGNOSIS — R739 Hyperglycemia, unspecified: Secondary | ICD-10-CM | POA: Diagnosis not present

## 2024-03-11 DIAGNOSIS — I10 Essential (primary) hypertension: Secondary | ICD-10-CM | POA: Diagnosis not present

## 2024-03-11 DIAGNOSIS — E1142 Type 2 diabetes mellitus with diabetic polyneuropathy: Secondary | ICD-10-CM | POA: Diagnosis not present

## 2024-03-16 DIAGNOSIS — E1159 Type 2 diabetes mellitus with other circulatory complications: Secondary | ICD-10-CM | POA: Diagnosis not present

## 2024-03-16 DIAGNOSIS — E782 Mixed hyperlipidemia: Secondary | ICD-10-CM | POA: Diagnosis not present

## 2024-03-16 DIAGNOSIS — I10 Essential (primary) hypertension: Secondary | ICD-10-CM | POA: Diagnosis not present

## 2024-03-16 DIAGNOSIS — I272 Pulmonary hypertension, unspecified: Secondary | ICD-10-CM | POA: Diagnosis not present

## 2024-03-23 NOTE — Progress Notes (Signed)
 Office Visit    Patient Name: Betty Goodman Date of Encounter: 03/24/2024  Primary Care Provider:  Jolee Greig DEL, PA-C Primary Cardiologist:  Alvan Carrier, MD  Chief Complaint    Hypertension - Advanced hypertension clinic  Past Medical History   AF CHADS2-VASc = 6  CAD Calcifications seen on CT  DM2 7/25 A1c 9.1, on Jardiance, glipizide, metformin  PAD No current claudication symptoms, had balloons done at provider in [place]  OSA Did not tolerate PAP    Allergies  Allergen Reactions   Sulfa Antibiotics     History of Present Illness    Betty Goodman is a 78 y.o. female patient who was referred to the Advanced Hypertension Clinic by Dr. Alvan.  She was first diagnosed in the 1990's and has difficulty getting control.  Most recently she was seen by Reche Finder NP in July, with a BP of 148/88.  At that time she explained that patient report of dry mouth was not likely to be caused by doxazosin  and encouraged her to restart at 4 mg bid.  Betty Goodman self-discontinued after just a short trial, citing dry mouth.    She is in the office today for follow up. In reviewing her medication intolerances, it is noted that she tried clonidine  at some point and that it was tolerated.  Betty Goodman does not recall this or why it was discontinued.    Blood Pressure Goal:  130/80  Current Medications: doxazosin  4 mg bid, Coreg  CR 40 mg daily, diltiazem  360 mg daily, valsartan  320 mg daily  Adherence Assessment  Do you ever forget to take your medication? [] Yes [x] No  Do you ever skip doses due to side effects? [] Yes [x] No  Do you have trouble affording your medicines? [] Yes [x] No  Are you ever unable to pick up your medication due to transportation difficulties? [] Yes [x] No   Adherence strategy: just lines up bottles  Previously tried:  Chlorthalidone  - dry  mouth Clonidine  - tolerated fine Lisinopril -HCTZ - switched to ARB Amlodipine - switched to Diltiazem  for rate  control Losartan-transition to valsartan  Spironolactone  - dry mouth Hydralazine  - ? Doxazosin  - dry mouth  Family Hx:   doesn't know father's family, mother family has hx of glaucoma and uterine cancer; 4 kids, unsure if any have hypertension  Social Hx:      Tobacco: no  Alcohol : once or twice per year  Caffeine: Coke Zero 20 oz daily, coffee is half decaf  Diet:  eats home cooked meals;  lactose intolerant; vegetables are fresh or frozen, not canned; not much meat, some roasts or pork loin, no breakfast meats; snacking is more fruit  Exercise: no  Home BP readings:  no readings with her today     Accessory Clinical Findings    Lab Results  Component Value Date   CREATININE 1.25 (H) 10/09/2022   BUN 25 10/09/2022   NA 140 10/09/2022   K 4.0 10/09/2022   CL 100 10/09/2022   CO2 25 10/09/2022   Lab Results  Component Value Date   ALT 26 03/01/2020   AST 20 03/01/2020   ALKPHOS 79 03/01/2020   BILITOT 0.9 03/01/2020   Lab Results  Component Value Date   HGBA1C 9.1 (H) 02/04/2024    Screening for Secondary Hypertension:      10/09/2022    9:44 AM  Causes  Drugs/Herbals Screened     - Comments No OTC agents, one 16 oz cup of coffee  Renovascular HTN Screened     -  Comments 2024 renal duplex no stenosis  Sleep Apnea Screened     - Comments 10/2022 OV with Dr. Shellia to establish  Thyroid  Disease Screened     - Comments 10/09/22 thyroid  panel  Hyperaldosteronism Not Screened     - Comments Unable to screen due to being on ARB - already on Spiro  Pheochromocytoma N/A     - Comments CT 03/2022 normal adrenals  Cushing's Syndrome Screened     - Comments Non cushingoid appearance  Coarctation of the Aorta Screened     - Comments 09/2022 carotid duplex ordered  Compliance Screened     - Comments takes medications regularly, gets through the [place]    Relevant Labs/Studies:    Latest Ref Rng & Units 10/09/2022   10:03 AM 03/01/2020   11:52 AM 09/19/2013    2:27 PM  Basic  Labs  Sodium 134 - 144 mmol/L 140  142  143   Potassium 3.5 - 5.2 mmol/L 4.0  3.6  3.9   Creatinine 0.57 - 1.00 mg/dL 8.74  8.92  9.15        Latest Ref Rng & Units 10/09/2022   10:03 AM  Thyroid    TSH 0.450 - 4.500 uIU/mL 0.845                 09/04/2022    9:27 AM  Renovascular   Renal Artery US  Completed Yes      Home Medications    Current Outpatient Medications  Medication Sig Dispense Refill   alendronate (FOSAMAX) 70 MG tablet Take 70 mg by mouth once a week. Take with a full glass of water on an empty stomach.     apixaban  (ELIQUIS ) 5 MG TABS tablet Take 1 tablet (5 mg total) by mouth 2 (two) times daily. 28 tablet 0   atorvastatin (LIPITOR) 40 MG tablet Take 40 mg by mouth at bedtime.     brimonidine-timolol (COMBIGAN) 0.2-0.5 % ophthalmic solution Place 1 drop into both eyes every 12 (twelve) hours.     carvedilol  (COREG  CR) 40 MG 24 hr capsule Take 1 capsule (40 mg total) by mouth daily. 90 capsule 3   Cholecalciferol (VITAMIN D) 50 MCG (2000 UT) CAPS Take 2 capsules by mouth 2 (two) times daily.     cloNIDine  (CATAPRES  - DOSED IN MG/24 HR) 0.1 mg/24hr patch Place 1 patch (0.1 mg total) onto the skin once a week. 4 patch 12   diltiazem  (CARDIZEM  CD) 360 MG 24 hr capsule Take 1 capsule (360 mg total) by mouth daily. 90 capsule 3   dorzolamide (TRUSOPT) 2 % ophthalmic solution Place 1 drop into the left eye 2 (two) times daily.     empagliflozin (JARDIANCE) 25 MG TABS tablet Take 25 mg by mouth daily.     ezetimibe (ZETIA) 10 MG tablet Take 1 tablet by mouth daily.     fluticasone (FLONASE) 50 MCG/ACT nasal spray Place 1 spray into both nostrils at bedtime.     glipiZIDE (GLUCOTROL) 10 MG tablet Take 10 mg by mouth daily before breakfast.     ketorolac  (ACULAR ) 0.5 % ophthalmic solution 1 drop 4 (four) times daily.     letrozole (FEMARA) 2.5 MG tablet Take 2.5 mg by mouth daily.     Liraglutide (VICTOZA [place]) Inject 1.8 mg into the skin daily.     loratadine (CLARITIN)  10 MG tablet Take 10 mg by mouth daily.     metFORMIN (GLUCOPHAGE-XR) 500 MG 24 hr tablet Take 1,000 mg by  mouth 2 (two) times daily.     valsartan  (DIOVAN ) 320 MG tablet Take 1 tablet (320 mg total) by mouth daily. 90 tablet 3   azelastine  (ASTELIN ) 0.1 % nasal spray Place 1 spray into both nostrils every morning. Use in each nostril as directed 30 mL 12   pantoprazole  (PROTONIX ) 40 MG tablet Take 1 tablet (40 mg total) by mouth daily before breakfast. 90 tablet 3   ROCKLATAN 0.02-0.005 % SOLN SMARTSIG:1 Drop(s) In Eye(s) Every Evening     No current facility-administered medications for this visit.     Assessment & Plan   HYPERTENSION CONTROL Vitals:   03/24/24 0901 03/24/24 0920  BP: (!) 184/140 (!) 208/135    The patient's blood pressure is elevated above target today.  In order to address the patient's elevated BP: A new medication was prescribed today.; Blood pressure will be monitored at home to determine if medication changes need to be made.      HTN (hypertension) Assessment: BP is uncontrolled in office BP 184/140 mmHg;  above the goal (<130/80). Multiple medication intolerances, notably diuretics Tolerates diltiazem , carvedilol  and valsartan  well, without any side effects Denies SOB, palpitation, chest pain, headaches,or swelling Reiterated the importance of regular exercise and low salt diet   Plan:  Start clonidine  0.1 mg patch, once weekly Continue taking diltiazem , carvedilol  and valsartan   Patient to keep record of BP readings with heart rate and report to us  at the next visit Encouraged to bring home BP device and readings Patient to follow up with me in 3-4 weeks  Labs ordered today:  none   Allean Mink PharmD CPP [facility]  [address] [place], [place] [address] [phone]

## 2024-03-24 ENCOUNTER — Ambulatory Visit (INDEPENDENT_AMBULATORY_CARE_PROVIDER_SITE_OTHER): Admitting: Pharmacist Clinician (PhC)/ Clinical Pharmacy Specialist

## 2024-03-24 ENCOUNTER — Encounter (HOSPITAL_BASED_OUTPATIENT_CLINIC_OR_DEPARTMENT_OTHER): Payer: Self-pay | Admitting: Pharmacist Clinician (PhC)/ Clinical Pharmacy Specialist

## 2024-03-24 VITALS — BP 208/135 | Wt 134.8 lb

## 2024-03-24 DIAGNOSIS — I1A0 Resistant hypertension: Secondary | ICD-10-CM

## 2024-03-24 MED ORDER — CLONIDINE 0.1 MG/24HR TD PTWK: 0.1000 mg | MEDICATED_PATCH | TRANSDERMAL | 12 refills | Status: DC

## 2024-03-24 MED ORDER — CLONIDINE 0.1 MG/24HR TD PTWK: 0.1000 mg | MEDICATED_PATCH | TRANSDERMAL | 3 refills | Status: AC

## 2024-03-24 NOTE — Assessment & Plan Note (Signed)
 Assessment: BP is uncontrolled in office BP 184/140 mmHg;  above the goal (<130/80). Multiple medication intolerances, notably diuretics Tolerates diltiazem , carvedilol  and valsartan  well, without any side effects Denies SOB, palpitation, chest pain, headaches,or swelling Reiterated the importance of regular exercise and low salt diet   Plan:  Start clonidine  0.1 mg patch, once weekly Continue taking diltiazem , carvedilol  and valsartan   Patient to keep record of BP readings with heart rate and report to us  at the next visit Encouraged to bring home BP device and readings Patient to follow up with me in 3-4 weeks  Labs ordered today:  none

## 2024-03-24 NOTE — Patient Instructions (Signed)
 Follow up appointment: Monday October 6 at 9 am  Take your BP meds as follows: START CLONIDINE  PATCH 0.1 MG ONCE WEEKLY     CONTINUE WITH CARVEDILOL , VALSARTAN  AND DILTIAZEM   Check your blood pressure at home daily and keep record of the readings.  Your blood pressure goal is < 140/90 (to start)  To check your pressure at home you will need to:  1. Sit up in a chair, with feet flat on the floor and back supported. Do not cross your ankles or legs. 2. Rest your left arm so that the cuff is about heart level. If the cuff goes on your upper arm,  then just relax the arm on the table, arm of the chair or your lap. If you have a wrist cuff, we  suggest relaxing your wrist against your chest (think of it as Pledging the Flag with the  wrong arm).  3. Place the cuff snugly around your arm, about 1 inch above the crook of your elbow. The  cords should be inside the groove of your elbow.  4. Sit quietly, with the cuff in place, for about 5 minutes. After that 5 minutes press the power  button to start a reading. 5. Do not talk or move while the reading is taking place.  6. Record your readings on a sheet of paper. Although most cuffs have a memory, it is often  easier to see a pattern developing when the numbers are all in front of you.  7. You can repeat the reading after 1-3 minutes if it is recommended  Make sure your bladder is empty and you have not had caffeine or tobacco within the last 30 min  Always bring your blood pressure log with you to your appointments. If you have not brought your monitor in to be double checked for accuracy, please bring it to your next appointment.  You can find a list of quality blood pressure cuffs at [email or web address][employer]no  Important lifestyle changes to control high blood pressure  Intervention  Effect on the BP  Lose extra pounds and watch your waistline Weight loss is one of the most effective lifestyle changes for controlling blood pressure. If you're  overweight or obese, losing even a small amount of weight can help reduce blood pressure. Blood pressure might go down by about 1 millimeter of mercury (mm Hg) with each kilogram (about 2.2 pounds) of weight lost.  Exercise regularly As a general goal, aim for at least 30 minutes of moderate physical activity every day. Regular physical activity can lower high blood pressure by about 5 to 8 mm Hg.  Eat a healthy diet Eating a diet rich in whole grains, fruits, vegetables, and low-fat dairy products and low in saturated fat and cholesterol. A healthy diet can lower high blood pressure by up to 11 mm Hg.  Reduce salt (sodium) in your diet Even a small reduction of sodium in the diet can improve heart health and reduce high blood pressure by about 5 to 6 mm Hg.  Limit alcohol  One drink equals 12 ounces of beer, 5 ounces of wine, or 1.5 ounces of 80-proof liquor.  Limiting alcohol  to less than one drink a day for women or two drinks a day for men can help lower blood pressure by about 4 mm Hg.   If you have any questions or concerns please use My Chart to send questions or call the office at [phone]

## 2024-03-24 NOTE — Addendum Note (Signed)
 Addended by: Chandell Attridge L on: 03/24/2024 11:06 AM   Modules accepted: Orders

## 2024-04-05 DIAGNOSIS — M810 Age-related osteoporosis without current pathological fracture: Secondary | ICD-10-CM | POA: Diagnosis not present

## 2024-04-05 DIAGNOSIS — Z780 Asymptomatic menopausal state: Secondary | ICD-10-CM | POA: Diagnosis not present

## 2024-04-12 DIAGNOSIS — E1142 Type 2 diabetes mellitus with diabetic polyneuropathy: Secondary | ICD-10-CM | POA: Diagnosis not present

## 2024-04-12 DIAGNOSIS — I10 Essential (primary) hypertension: Secondary | ICD-10-CM | POA: Diagnosis not present

## 2024-04-18 ENCOUNTER — Ambulatory Visit (HOSPITAL_BASED_OUTPATIENT_CLINIC_OR_DEPARTMENT_OTHER): Admitting: Pharmacist Clinician (PhC)/ Clinical Pharmacy Specialist

## 2024-05-13 DIAGNOSIS — E1142 Type 2 diabetes mellitus with diabetic polyneuropathy: Secondary | ICD-10-CM | POA: Diagnosis not present

## 2024-05-13 DIAGNOSIS — I10 Essential (primary) hypertension: Secondary | ICD-10-CM | POA: Diagnosis not present

## 2024-05-16 DIAGNOSIS — Z79811 Long term (current) use of aromatase inhibitors: Secondary | ICD-10-CM | POA: Diagnosis not present

## 2024-05-16 DIAGNOSIS — C50411 Malignant neoplasm of upper-outer quadrant of right female breast: Secondary | ICD-10-CM | POA: Diagnosis not present

## 2024-05-16 DIAGNOSIS — Z170 Estrogen receptor positive status [ER+]: Secondary | ICD-10-CM | POA: Diagnosis not present

## 2024-05-16 DIAGNOSIS — I129 Hypertensive chronic kidney disease with stage 1 through stage 4 chronic kidney disease, or unspecified chronic kidney disease: Secondary | ICD-10-CM | POA: Diagnosis not present

## 2024-05-16 DIAGNOSIS — Z87891 Personal history of nicotine dependence: Secondary | ICD-10-CM | POA: Diagnosis not present

## 2024-05-16 DIAGNOSIS — E785 Hyperlipidemia, unspecified: Secondary | ICD-10-CM | POA: Diagnosis not present

## 2024-05-16 DIAGNOSIS — I4891 Unspecified atrial fibrillation: Secondary | ICD-10-CM | POA: Diagnosis not present

## 2024-05-16 DIAGNOSIS — Z7901 Long term (current) use of anticoagulants: Secondary | ICD-10-CM | POA: Diagnosis not present

## 2024-05-16 DIAGNOSIS — N189 Chronic kidney disease, unspecified: Secondary | ICD-10-CM | POA: Diagnosis not present

## 2024-05-16 DIAGNOSIS — Z1231 Encounter for screening mammogram for malignant neoplasm of breast: Secondary | ICD-10-CM | POA: Diagnosis not present

## 2024-05-16 DIAGNOSIS — Z7985 Long-term (current) use of injectable non-insulin antidiabetic drugs: Secondary | ICD-10-CM | POA: Diagnosis not present

## 2024-05-16 DIAGNOSIS — Z7983 Long term (current) use of bisphosphonates: Secondary | ICD-10-CM | POA: Diagnosis not present

## 2024-05-16 DIAGNOSIS — Z79899 Other long term (current) drug therapy: Secondary | ICD-10-CM | POA: Diagnosis not present

## 2024-05-16 DIAGNOSIS — E1122 Type 2 diabetes mellitus with diabetic chronic kidney disease: Secondary | ICD-10-CM | POA: Diagnosis not present

## 2024-05-16 DIAGNOSIS — Z7984 Long term (current) use of oral hypoglycemic drugs: Secondary | ICD-10-CM | POA: Diagnosis not present

## 2024-05-24 ENCOUNTER — Ambulatory Visit: Attending: Cardiology | Admitting: Cardiology

## 2024-05-24 ENCOUNTER — Encounter: Payer: Self-pay | Admitting: Cardiology

## 2024-05-24 VITALS — BP 168/85 | HR 76 | Ht 60.0 in | Wt 139.4 lb

## 2024-05-24 DIAGNOSIS — I10 Essential (primary) hypertension: Secondary | ICD-10-CM | POA: Insufficient documentation

## 2024-05-24 DIAGNOSIS — I4891 Unspecified atrial fibrillation: Secondary | ICD-10-CM | POA: Insufficient documentation

## 2024-05-24 DIAGNOSIS — D6869 Other thrombophilia: Secondary | ICD-10-CM | POA: Diagnosis not present

## 2024-05-24 DIAGNOSIS — I1A0 Resistant hypertension: Secondary | ICD-10-CM | POA: Insufficient documentation

## 2024-05-24 MED ORDER — ROSUVASTATIN CALCIUM 40 MG PO TABS: 40.0000 mg | ORAL_TABLET | Freq: Every day | ORAL | 3 refills | Status: AC

## 2024-05-24 MED ORDER — ROSUVASTATIN CALCIUM 40 MG PO TABS: 40.0000 mg | ORAL_TABLET | Freq: Every day | ORAL | 6 refills | Status: DC

## 2024-05-24 NOTE — Patient Instructions (Signed)
Medication Instructions:  Stop Atorvastatin (Lipitor) Begin Crestor (Rosuvastatin) '40mg'$  daily   Continue all other medications.     Labwork: none  Testing/Procedures: none  Follow-Up: 6 months   Any Other Special Instructions Will Be Listed Below (If Applicable).   If you need a refill on your cardiac medications before your next appointment, please call your pharmacy.

## 2024-05-24 NOTE — Progress Notes (Signed)
 Clinical Summary Ms. Scales is a 78 y.o.female seen today for follow up of the following medical problems.    Afib - no recent palpitations. - compliant with meds - no bleeding on eliquis      2.Resistant HTN - followed in HTN clinic - severe OSA, intolerant to cpap 09/04/2022 with no renal artery stenosis.  10/01/2022 normal thyroid  function    - seen in HTN clinic - dry mouth, aldactone  was stopped but ongoing issues. Chlrothalidone then decreased to 12.5mg  daily.  - she is compliant with meds     Previous antihypertensives: Chlorthalidone  - does not recall taking Clonidine  - tolerated fine Lisinopril -HCTZ - switched to ARB Amlodipine - switched to Diltiazem  for rate control Losartan-transition to valsartan  Spironolactone  - dry mouth   - 03/24/24 visit with pharmD clonidine  0.1mg  patch added. Has f/u 06/02/24      3.Breast cancer - followed by oncology     4.Pulmonary HTN - April 2021 echo reported PASP in the 80s. On review unclear of that calculation, highest in the 50s.  - 10/2020 echo LVEF 60-65%, indet diastolic, normal RV, severe BAE, mod TR, PASP is 32 - severe BAE would suggest significant diastolic dysfunction> she also has OSA - PFTs were unremarkable, negative serologies.   - some SOB but she related to sinus congestion.  - chronic SOB/DOE unchanged   5.OSA - has not wanted to wear cpap    6.DM2 - 01/2024 A1c 9.1     6. PAD - reports prior intervention at [facility], unclear details - 08/2023 had successful endovascular revasc of lower extremities.  - had nonhealing ulcers, these have now healed followeing procedure     7.HLD - 01/2024 LDL 85, was to change from atorva 40mg  to crestor 40mg  daily however has not made change yet     Social history: She is originally from [facility], [place] .  She is widowed.  Her husband passed away in 06/17/2018.  She has an adult daughter, Cathryne, who lives in the [place] area. She has another daughter in [place]  in  the [place] area. She has 2 sons in [place]. She raised herself since the age of 15 initially in [place] , [place]. Past Medical History:  Diagnosis Date   Atrial fibrillation (HCC)    Breast cancer (HCC)    Stage IB (pT1, pN71micro, cM0) right breast cancer   Hypertension    Kidney stone    resulting in stenting   Type 2 diabetes mellitus (HCC)    Unspecified essential hypertension      Allergies  Allergen Reactions   Sulfa Antibiotics      Current Outpatient Medications  Medication Sig Dispense Refill   alendronate (FOSAMAX) 70 MG tablet Take 70 mg by mouth once a week. Take with a full glass of water on an empty stomach.     apixaban  (ELIQUIS ) 5 MG TABS tablet Take 1 tablet (5 mg total) by mouth 2 (two) times daily. 28 tablet 0   azelastine  (ASTELIN ) 0.1 % nasal spray Place 1 spray into both nostrils every morning. Use in each nostril as directed 30 mL 12   brimonidine (ALPHAGAN) 0.2 % ophthalmic solution Place 1 drop into both eyes daily.     brimonidine-timolol (COMBIGAN) 0.2-0.5 % ophthalmic solution Place 1 drop into both eyes every 12 (twelve) hours.     carvedilol  (COREG  CR) 40 MG 24 hr capsule Take 1 capsule (40 mg total) by mouth daily. 90 capsule 3   Cholecalciferol (VITAMIN D) 50  MCG (2000 UT) CAPS Take 2 capsules by mouth 2 (two) times daily.     cloNIDine  (CATAPRES  - DOSED IN MG/24 HR) 0.1 mg/24hr patch Place 1 patch (0.1 mg total) onto the skin once a week. 12 patch 3   diltiazem  (CARDIZEM  CD) 360 MG 24 hr capsule Take 1 capsule (360 mg total) by mouth daily. 90 capsule 3   dorzolamide (TRUSOPT) 2 % ophthalmic solution Place 1 drop into the left eye 2 (two) times daily.     empagliflozin (JARDIANCE) 25 MG TABS tablet Take 25 mg by mouth daily.     ezetimibe (ZETIA) 10 MG tablet Take 1 tablet by mouth daily.     fluticasone (FLONASE) 50 MCG/ACT nasal spray Place 1 spray into both nostrils at bedtime.     glipiZIDE (GLUCOTROL) 10 MG tablet Take 10 mg by mouth daily before  breakfast.     ketorolac  (ACULAR ) 0.5 % ophthalmic solution 1 drop 4 (four) times daily.     letrozole (FEMARA) 2.5 MG tablet Take 2.5 mg by mouth daily.     Liraglutide (VICTOZA [place]) Inject 1.8 mg into the skin daily.     loratadine (CLARITIN) 10 MG tablet Take 10 mg by mouth daily.     metFORMIN (GLUCOPHAGE-XR) 500 MG 24 hr tablet Take 1,000 mg by mouth 2 (two) times daily.     nystatin (MYCOSTATIN/NYSTOP) powder Apply 1 Application topically 3 (three) times daily.     pantoprazole  (PROTONIX ) 40 MG tablet Take 1 tablet (40 mg total) by mouth daily before breakfast. 90 tablet 3   ROCKLATAN 0.02-0.005 % SOLN SMARTSIG:1 Drop(s) In Eye(s) Every Evening     valsartan  (DIOVAN ) 320 MG tablet Take 1 tablet (320 mg total) by mouth daily. 90 tablet 3   rosuvastatin (CRESTOR) 40 MG tablet Take 1 tablet (40 mg total) by mouth daily. 90 tablet 3   No current facility-administered medications for this visit.     Past Surgical History:  Procedure Laterality Date   ADENOIDECTOMY     COLONOSCOPY  08/17/2019   Dr. Celia; Mild nonbleeding diverticulosis in the left colon. Recommended consider repeat in 5-10 years given questionable maternal grandmother with colon cancer   TONSILLECTOMY     TUBAL LIGATION       Allergies  Allergen Reactions   Sulfa Antibiotics       Family History  Problem Relation Age of Onset   Cancer Mother        Liver   Leukemia Sister    Colon cancer Neg Hx      Social History Ms. Medero reports that she has never smoked. She has never been exposed to tobacco smoke. She has never used smokeless tobacco. Ms. Poncedeleon reports no history of alcohol  use.   Physical Examination Today's Vitals   05/24/24 0931 05/24/24 0959  BP: (!) 178/88 (!) 168/85  Pulse: 76   SpO2: 94%   Weight: 139 lb 6.4 oz (63.2 kg)   Height: 5' (1.524 m)    Body mass index is 27.22 kg/m.  Gen: resting comfortably, no acute distress HEENT: no scleral icterus, pupils equal round and  reactive, no palptable cervical adenopathy,  CV: RRR, no m/rg no jvd Resp: Clear to auscultation bilaterally GI: abdomen is soft, non-tender, non-distended, normal bowel sounds, no hepatosplenomegaly MSK: extremities are warm, no edema.  Skin: warm, no rash Neuro:  no focal deficits Psych: appropriate affect   Diagnostic Studies  10/2020 echo IMPRESSIONS     1. Left ventricular ejection fraction,  by estimation, is 60 to 65%. The  left ventricle has normal function. The left ventricle has no regional  wall motion abnormalities. There is severe left ventricular hypertrophy.  Left ventricular diastolic parameters   are indeterminate.   2. Right ventricular systolic function is normal. The right ventricular  size is normal.   3. Left atrial size was severely dilated.   4. Right atrial size was severely dilated.   5. The mitral valve is normal in structure. Trivial mitral valve  regurgitation. No evidence of mitral stenosis.   6. The tricuspid valve is abnormal. Tricuspid valve regurgitation is  moderate.   7. The aortic valve is tricuspid. There is mild calcification of the  aortic valve. There is mild thickening of the aortic valve. Aortic valve  regurgitation is not visualized. No aortic stenosis is present.   8. Mild pulmonary HTN, PASP is 32 mmHg.   9. The inferior vena cava is normal in size with greater than 50%  respiratory variability, suggesting right atrial pressure of 3 mmHg.   Assessment and Plan   Afib/acquired thrombophilia -denies any symptoms, continue current meds   2. Resistant HTN - elevated here, recently started on clonidine  patch by HTN clinic with f/u next week - will defer bp management to HTN clinic to avoid complicating management by multiple providers   3. Pulmonary HTN - reviwed echos as reported above, most recent study mild pulm HTN likely secondary to diastolic dysfunction and OSA. No strong indication for more involved testing  -we will  continue to monitor at this time  4. HLD - LDL above goal, change atorvastatin to crestor 40mg  daily.      Dorn PHEBE Ross, M.D.,

## 2024-05-27 DIAGNOSIS — M79675 Pain in left toe(s): Secondary | ICD-10-CM | POA: Diagnosis not present

## 2024-05-31 DIAGNOSIS — I739 Peripheral vascular disease, unspecified: Secondary | ICD-10-CM | POA: Diagnosis not present

## 2024-06-01 NOTE — Progress Notes (Signed)
 Office Visit    Patient Name: Betty Goodman Date of Encounter: 06/06/2024  Primary Care Provider:  Jolee Greig DEL, PA-C Primary Cardiologist:  Alvan Carrier, MD  Chief Complaint    Hypertension - Advanced hypertension clinic  Past Medical History   AF CHADS2-VASc = 6  CAD Calcifications seen on CT  DM2 7/25 A1c 9.1, on Jardiance, glipizide, metformin  PAD No current claudication symptoms, had balloons done at provider in [place]  OSA Did not tolerate PAP    Allergies  Allergen Reactions   Sulfa Antibiotics     History of Present Illness    Betty Goodman is a 78 y.o. female patient who was referred to the Advanced Hypertension Clinic by Dr. Alvan.  She was first diagnosed in the 1990's and has difficulty getting control.  Most recently she was seen by Reche Finder NP in July, with a BP of 148/88.  At that time she explained that patient report of dry mouth was not likely to be caused by doxazosin  and encouraged her to restart at 4 mg bid.  Betty Goodman self-discontinued after just a short trial, citing dry mouth.     She is in the office today for follow up.  She has been using the clonidine  patches for about a month now and is tolerating well.    Patient stopped doxazosin  because of cotton mouth - says quit about a year ago Took off patch this morning, has not put on next one Betty Goodman - 158/102  Blood Pressure Goal:  130/80  Current Medications: Coreg  CR 40 mg daily, diltiazem  360 mg daily, valsartan  320 mg daily, clonidine  0.2 mg patch  Adherence Assessment  Do you ever forget to take your medication? [] Yes [x] No  Do you ever skip doses due to side effects? [] Yes [x] No  Do you have trouble affording your medicines? [] Yes [x] No  Are you ever unable to pick up your medication due to transportation difficulties? [] Yes [x] No   Adherence strategy: just lines up bottles  Previously tried:  Chlorthalidone  - dry  mouth Clonidine  - tolerated fine Lisinopril -HCTZ - switched  to ARB Amlodipine - switched to Diltiazem  for rate control Losartan-transition to valsartan  Spironolactone  - dry mouth Hydralazine  - ? Doxazosin  - dry mouth  Family Hx:   doesn't know father's family, mother family has hx of glaucoma and uterine cancer; 4 kids, unsure if any have hypertension  Social Hx:      Tobacco: no  Alcohol : once or twice per year  Caffeine: Coke Zero 20 oz daily, coffee is half decaf  Diet:  eats home cooked meals;  lactose intolerant; vegetables are fresh or frozen, not canned; not much meat, some roasts or pork loin, no breakfast meats; snacking is more fruit  Exercise: no  Home BP readings:  no readings with her today     Accessory Clinical Findings    Lab Results  Component Value Date   CREATININE 1.25 (H) 10/09/2022   BUN 25 10/09/2022   NA 140 10/09/2022   K 4.0 10/09/2022   CL 100 10/09/2022   CO2 25 10/09/2022   Lab Results  Component Value Date   ALT 26 03/01/2020   AST 20 03/01/2020   ALKPHOS 79 03/01/2020   BILITOT 0.9 03/01/2020   Lab Results  Component Value Date   HGBA1C 9.1 (H) 02/04/2024    Screening for Secondary Hypertension:      10/09/2022    9:44 AM  Causes  Drugs/Herbals Screened     -  Comments No OTC agents, one 16 oz cup of coffee  Renovascular HTN Screened     - Comments 2024 renal duplex no stenosis  Sleep Apnea Screened     - Comments 10/2022 OV with Dr. Shellia to establish  Thyroid  Disease Screened     - Comments 10/09/22 thyroid  panel  Hyperaldosteronism Not Screened     - Comments Unable to screen due to being on ARB - already on Spiro  Pheochromocytoma N/A     - Comments CT 03/2022 normal adrenals  Cushing's Syndrome Screened     - Comments Non cushingoid appearance  Coarctation of the Aorta Screened     - Comments 09/2022 carotid duplex ordered  Compliance Screened     - Comments takes medications regularly, gets through the [place]    Relevant Labs/Studies:    Latest Ref Rng & Units 10/09/2022   10:03  AM 03/01/2020   11:52 AM 09/19/2013    2:27 PM  Basic Labs  Sodium 134 - 144 mmol/L 140  142  143   Potassium 3.5 - 5.2 mmol/L 4.0  3.6  3.9   Creatinine 0.57 - 1.00 mg/dL 8.74  8.92  9.15        Latest Ref Rng & Units 10/09/2022   10:03 AM  Thyroid    TSH 0.450 - 4.500 uIU/mL 0.845                 09/04/2022    9:27 AM  Renovascular   Renal Artery US  Completed Yes      Home Medications    Current Outpatient Medications  Medication Sig Dispense Refill   alendronate (FOSAMAX) 70 MG tablet Take 70 mg by mouth once a week. Take with a full glass of water on an empty stomach.     apixaban  (ELIQUIS ) 5 MG TABS tablet Take 1 tablet (5 mg total) by mouth 2 (two) times daily. 28 tablet 0   brimonidine (ALPHAGAN) 0.2 % ophthalmic solution Place 1 drop into both eyes daily.     brimonidine-timolol (COMBIGAN) 0.2-0.5 % ophthalmic solution Place 1 drop into both eyes every 12 (twelve) hours.     carvedilol  (COREG  CR) 80 MG 24 hr capsule Take 1 capsule (80 mg total) by mouth daily. 90 capsule 3   Cholecalciferol (VITAMIN D) 50 MCG (2000 UT) CAPS Take 2 capsules by mouth 2 (two) times daily.     cloNIDine  (CATAPRES  - DOSED IN MG/24 HR) 0.1 mg/24hr patch Place 1 patch (0.1 mg total) onto the skin once a week. 12 patch 3   diltiazem  (CARDIZEM  CD) 360 MG 24 hr capsule Take 1 capsule (360 mg total) by mouth daily. 90 capsule 3   dorzolamide (TRUSOPT) 2 % ophthalmic solution Place 1 drop into the left eye 2 (two) times daily.     empagliflozin (JARDIANCE) 25 MG TABS tablet Take 25 mg by mouth daily.     ezetimibe (ZETIA) 10 MG tablet Take 1 tablet by mouth daily.     fluticasone (FLONASE) 50 MCG/ACT nasal spray Place 1 spray into both nostrils at bedtime.     glipiZIDE (GLUCOTROL) 10 MG tablet Take 10 mg by mouth daily before breakfast.     ketorolac  (ACULAR ) 0.5 % ophthalmic solution 1 drop 4 (four) times daily.     letrozole (FEMARA) 2.5 MG tablet Take 2.5 mg by mouth daily.     Liraglutide  (VICTOZA [place]) Inject 1.8 mg into the skin daily.     loratadine (CLARITIN) 10 MG tablet Take  10 mg by mouth daily.     metFORMIN (GLUCOPHAGE-XR) 500 MG 24 hr tablet Take 1,000 mg by mouth 2 (two) times daily.     nystatin (MYCOSTATIN/NYSTOP) powder Apply 1 Application topically 3 (three) times daily.     pantoprazole  (PROTONIX ) 40 MG tablet Take 1 tablet (40 mg total) by mouth daily before breakfast. 90 tablet 3   ROCKLATAN 0.02-0.005 % SOLN SMARTSIG:1 Drop(s) In Eye(s) Every Evening     rosuvastatin  (CRESTOR ) 40 MG tablet Take 1 tablet (40 mg total) by mouth daily. 90 tablet 3   valsartan  (DIOVAN ) 320 MG tablet Take 1 tablet (320 mg total) by mouth daily. 90 tablet 3   azelastine  (ASTELIN ) 0.1 % nasal spray Place 1 spray into both nostrils every morning. Use in each nostril as directed (Patient not taking: Reported on 06/02/2024) 30 mL 12   No current facility-administered medications for this visit.     Assessment & Plan     HTN (hypertension) Assessment: BP is uncontrolled in office BP 158/102 mmHg;  above the goal (<130/80). Unsure of compliance - has not appeared to pick up clonidine  0.2 mg patches Tolerates current medications well, without any side effects Denies SOB, palpitation, chest pain, headaches,or swelling Reiterated the importance of regular exercise and low salt diet   Plan:  Increase carvedilol  to 80 mg CR daily - take 2 of the 40 mg capsules daily until gone Continue taking diltiazem  360 mg daily, valsartan  320 mg daily, clonidine  0.2 mg patch - please apply patch as soon as you get home Patient to keep record of BP readings with heart rate and report to us  at the next visit Patient to follow up with me in 3 months  Labs ordered today:  none   Allean Mink PharmD CPP [facility]  [address] [place], [place] [address] [phone]

## 2024-06-02 ENCOUNTER — Ambulatory Visit (INDEPENDENT_AMBULATORY_CARE_PROVIDER_SITE_OTHER): Admitting: Pharmacist Clinician (PhC)/ Clinical Pharmacy Specialist

## 2024-06-02 VITALS — BP 158/102 | HR 78 | Ht 60.0 in | Wt 138.4 lb

## 2024-06-02 DIAGNOSIS — I1A0 Resistant hypertension: Secondary | ICD-10-CM

## 2024-06-02 MED ORDER — CARVEDILOL PHOSPHATE ER 80 MG PO CP24: 80.0000 mg | ORAL_CAPSULE | Freq: Every day | ORAL | 3 refills | Status: AC

## 2024-06-02 NOTE — Patient Instructions (Signed)
 Follow up appointment: Tuesday Feb 24 at 11 am  Take your BP meds as follows:  INCREASE CARVEDILOL  TO 80 MG ONCE DAILY (TAKE 2 OF THE 40 MG TABLETS DAILY UNTIL GONE)  CONTINUE WITH DILTIAZEM  360 MG DAILY, VALSARTAN  320 MG DAILY AND CLONIDINE  PATCHES 0.2 MG WEEKLY  Check your blood pressure at home 3-4 days per week and keep record of the readings.  Your blood pressure goal is <140/80  To check your pressure at home you will need to:  1. Sit up in a chair, with feet flat on the floor and back supported. Do not cross your ankles or legs. 2. Rest your left arm so that the cuff is about heart level. If the cuff goes on your upper arm,  then just relax the arm on the table, arm of the chair or your lap. If you have a wrist cuff, we  suggest relaxing your wrist against your chest (think of it as Pledging the Flag with the  wrong arm).  3. Place the cuff snugly around your arm, about 1 inch above the crook of your elbow. The  cords should be inside the groove of your elbow.  4. Sit quietly, with the cuff in place, for about 5 minutes. After that 5 minutes press the power  button to start a reading. 5. Do not talk or move while the reading is taking place.  6. Record your readings on a sheet of paper. Although most cuffs have a memory, it is often  easier to see a pattern developing when the numbers are all in front of you.  7. You can repeat the reading after 1-3 minutes if it is recommended  Make sure your bladder is empty and you have not had caffeine or tobacco within the last 30 min  Always bring your blood pressure log with you to your appointments. If you have not brought your monitor in to be double checked for accuracy, please bring it to your next appointment.  You can find a list of quality blood pressure cuffs at [email or web address].no  Important lifestyle changes to control high blood pressure  Intervention  Effect on the BP  Lose extra pounds and watch your waistline Weight loss is  one of the most effective lifestyle changes for controlling blood pressure. If you're overweight or obese, losing even a small amount of weight can help reduce blood pressure. Blood pressure might go down by about 1 millimeter of mercury (mm Hg) with each kilogram (about 2.2 pounds) of weight lost.  Exercise regularly As a general goal, aim for at least 30 minutes of moderate physical activity every day. Regular physical activity can lower high blood pressure by about 5 to 8 mm Hg.  Eat a healthy diet Eating a diet rich in whole grains, fruits, vegetables, and low-fat dairy products and low in saturated fat and cholesterol. A healthy diet can lower high blood pressure by up to 11 mm Hg.  Reduce salt (sodium) in your diet Even a small reduction of sodium in the diet can improve heart health and reduce high blood pressure by about 5 to 6 mm Hg.  Limit alcohol  One drink equals 12 ounces of beer, 5 ounces of wine, or 1.5 ounces of 80-proof liquor.  Limiting alcohol  to less than one drink a day for women or two drinks a day for men can help lower blood pressure by about 4 mm Hg.   If you have any questions or concerns please use My Chart  to send questions or call the office at [phone]

## 2024-06-06 ENCOUNTER — Encounter (HOSPITAL_BASED_OUTPATIENT_CLINIC_OR_DEPARTMENT_OTHER): Payer: Self-pay | Admitting: Pharmacist Clinician (PhC)/ Clinical Pharmacy Specialist

## 2024-06-06 NOTE — Assessment & Plan Note (Signed)
 Assessment: BP is uncontrolled in office BP 158/102 mmHg;  above the goal (<130/80). Unsure of compliance - has not appeared to pick up clonidine  0.2 mg patches Tolerates current medications well, without any side effects Denies SOB, palpitation, chest pain, headaches,or swelling Reiterated the importance of regular exercise and low salt diet   Plan:  Increase carvedilol  to 80 mg CR daily - take 2 of the 40 mg capsules daily until gone Continue taking diltiazem  360 mg daily, valsartan  320 mg daily, clonidine  0.2 mg patch - please apply patch as soon as you get home Patient to keep record of BP readings with heart rate and report to us  at the next visit Patient to follow up with me in 3 months  Labs ordered today:  none

## 2024-06-07 DIAGNOSIS — E7849 Other hyperlipidemia: Secondary | ICD-10-CM | POA: Diagnosis not present

## 2024-06-07 DIAGNOSIS — E1122 Type 2 diabetes mellitus with diabetic chronic kidney disease: Secondary | ICD-10-CM | POA: Diagnosis not present

## 2024-06-07 DIAGNOSIS — N184 Chronic kidney disease, stage 4 (severe): Secondary | ICD-10-CM | POA: Diagnosis not present

## 2024-06-07 DIAGNOSIS — R739 Hyperglycemia, unspecified: Secondary | ICD-10-CM | POA: Diagnosis not present

## 2024-06-10 DIAGNOSIS — E1142 Type 2 diabetes mellitus with diabetic polyneuropathy: Secondary | ICD-10-CM | POA: Diagnosis not present

## 2024-06-10 DIAGNOSIS — I10 Essential (primary) hypertension: Secondary | ICD-10-CM | POA: Diagnosis not present

## 2024-06-14 ENCOUNTER — Encounter: Payer: Self-pay | Admitting: Internal Medicine

## 2024-06-22 DIAGNOSIS — Z170 Estrogen receptor positive status [ER+]: Secondary | ICD-10-CM | POA: Diagnosis not present

## 2024-06-22 DIAGNOSIS — C50411 Malignant neoplasm of upper-outer quadrant of right female breast: Secondary | ICD-10-CM | POA: Diagnosis not present

## 2024-08-25 ENCOUNTER — Ambulatory Visit: Admitting: Physician Assistant

## 2024-09-06 ENCOUNTER — Ambulatory Visit (HOSPITAL_BASED_OUTPATIENT_CLINIC_OR_DEPARTMENT_OTHER): Admitting: Pharmacist Clinician (PhC)/ Clinical Pharmacy Specialist

## 2024-09-08 ENCOUNTER — Inpatient Hospital Stay
